# Patient Record
Sex: Female | Born: 1945 | Race: Asian | Hispanic: No | Marital: Married | Smoking: Never smoker
Health system: Southern US, Community
[De-identification: ages and names within clinical notes are randomized; demographics above are authoritative.]

## PROBLEM LIST (undated history)

## (undated) DIAGNOSIS — F32A Depression, unspecified: Secondary | ICD-10-CM

## (undated) DIAGNOSIS — M1990 Unspecified osteoarthritis, unspecified site: Secondary | ICD-10-CM

## (undated) DIAGNOSIS — I10 Essential (primary) hypertension: Secondary | ICD-10-CM

## (undated) DIAGNOSIS — I639 Cerebral infarction, unspecified: Secondary | ICD-10-CM

## (undated) DIAGNOSIS — I4891 Unspecified atrial fibrillation: Secondary | ICD-10-CM

## (undated) DIAGNOSIS — E785 Hyperlipidemia, unspecified: Secondary | ICD-10-CM

---

## 2012-10-26 ENCOUNTER — Encounter (HOSPITAL_BASED_OUTPATIENT_CLINIC_OR_DEPARTMENT_OTHER): Payer: Self-pay

## 2012-10-26 ENCOUNTER — Emergency Department (HOSPITAL_BASED_OUTPATIENT_CLINIC_OR_DEPARTMENT_OTHER)
Admission: EM | Admit: 2012-10-26 | Discharge: 2012-10-26 | Disposition: A | Discharge status: Home / Self Care | Payer: Medicare Other | Attending: Emergency Medicine | Admitting: Emergency Medicine

## 2012-10-26 DIAGNOSIS — I10 Essential (primary) hypertension: Secondary | ICD-10-CM | POA: Insufficient documentation

## 2012-10-26 DIAGNOSIS — R040 Epistaxis: Secondary | ICD-10-CM

## 2012-10-26 DIAGNOSIS — E785 Hyperlipidemia, unspecified: Secondary | ICD-10-CM | POA: Insufficient documentation

## 2012-10-26 DIAGNOSIS — Z79899 Other long term (current) drug therapy: Secondary | ICD-10-CM | POA: Insufficient documentation

## 2012-10-26 DIAGNOSIS — Z8739 Personal history of other diseases of the musculoskeletal system and connective tissue: Secondary | ICD-10-CM | POA: Insufficient documentation

## 2012-10-26 HISTORY — DX: Essential (primary) hypertension: I10

## 2012-10-26 HISTORY — DX: Hyperlipidemia, unspecified: E78.5

## 2012-10-26 HISTORY — DX: Unspecified osteoarthritis, unspecified site: M19.90

## 2012-10-26 MED ORDER — COCAINE HCL 4 % EX SOLN: 4.0000 mL | Freq: Once | CUTANEOUS | Status: AC

## 2012-10-26 MED ORDER — COCAINE HCL 4 % EX SOLN
CUTANEOUS | Status: AC
  Administered 2012-10-26: 4 mL via NASAL
  Filled 2012-10-26: qty 4

## 2012-10-26 MED ORDER — HYDROCODONE-ACETAMINOPHEN 5-325 MG PO TABS: 1.0000 | ORAL_TABLET | ORAL | Status: DC | PRN

## 2012-10-26 MED ORDER — HYDROCODONE-ACETAMINOPHEN 5-325 MG PO TABS
1.0000 | ORAL_TABLET | Freq: Once | ORAL | Status: AC
  Administered 2012-10-26: 1 via ORAL
  Filled 2012-10-26: qty 1

## 2012-10-26 NOTE — ED Notes (Signed)
MD at bedside. 

## 2012-10-26 NOTE — ED Notes (Signed)
MD at bedside using fiberoptic scope. Pts left hare has started bleeding again.

## 2012-10-26 NOTE — ED Notes (Signed)
Rapid Rhino placed in left nare by Dr. Ignacia Palma.  Pt tolerated procedure well.

## 2012-10-26 NOTE — ED Notes (Signed)
MD at bedside giving DC plan of care.

## 2012-10-26 NOTE — ED Provider Notes (Signed)
History     CSN: [id number]  Arrival date & time 10/26/12  1702   First MD Initiated Contact with Patient 10/26/12 1717      Chief Complaint  Patient presents with  . Epistaxis    (Consider location/radiation/quality/duration/timing/severity/associated sxs/prior treatment) Patient is a 67 y.o. female presenting with nosebleeds. The history is provided by the patient and a relative. A language interpreter was used (Pt's family member interprets for her.).  Epistaxis  This is a new problem. The current episode started yesterday. Episode frequency: Pt had nosebleed yesterday, which seemed to come from both nostrils, and resolved with direct pressure.  It recurred today, and did not resolve with pressure for one hour. The problem has not changed since onset.Associated with: She has a history of hypertension.  She does not take anticoagulant medicine. The bleeding has been from both nares. She has tried applying pressure for the symptoms. The treatment provided no relief. Her past medical history does not include bleeding disorder or frequent nosebleeds.    Past Medical History  Diagnosis Date  . Hypertension   . Hyperlipidemia   . Arthritis     History reviewed. No pertinent past surgical history.  History reviewed. No pertinent family history.  History  Substance Use Topics  . Smoking status: Never Smoker   . Smokeless tobacco: Never Used  . Alcohol Use: No    OB History   Grav Para Term Preterm Abortions TAB SAB Ect Mult Living                  Review of Systems  Constitutional: Negative for fever and chills.  HENT: Positive for nosebleeds.   Eyes: Negative.   Respiratory: Negative.   Cardiovascular:       Hypertension.  Gastrointestinal: Negative.   Genitourinary: Negative.   Musculoskeletal: Negative.   Skin: Negative.   Neurological: Negative.   Psychiatric/Behavioral: Negative.     Allergies  Review of patient's allergies indicates no known  allergies.  Home Medications   Current Outpatient Rx  Name  Route  Sig  Dispense  Refill  . HYDROcodone-acetaminophen (NORCO/VICODIN) 5-325 MG per tablet   Oral   Take 1 tablet by mouth every 6 (six) hours as needed for pain.         Marland Kitchen METOPROLOL SUCCINATE ER PO   Oral   Take by mouth daily.         Marland Kitchen PRAVASTATIN SODIUM PO   Oral   Take by mouth daily.         . Valsartan (DIOVAN PO)   Oral   Take by mouth daily.           BP 229/112  Pulse 70  Resp 20  SpO2 98%  Physical Exam  Nursing note and vitals reviewed. Constitutional:  BP 229/112.  Bleeding profusely from both nostrils.  HENT:  Head: Normocephalic and atraumatic.  Right Ear: External ear normal.  Left Ear: External ear normal.  Mouth/Throat: Oropharynx is clear and moist.  Profuse bleeding from both nostrils, precluding visualization of a point of bleeding.  Eyes: Conjunctivae and EOM are normal. Pupils are equal, round, and reactive to light.  Neck: Normal range of motion. Neck supple.  Cardiovascular: Normal rate, regular rhythm and normal heart sounds.   Pulmonary/Chest: Effort normal and breath sounds normal.  Abdominal: Soft. Bowel sounds are normal.  Musculoskeletal: Normal range of motion. She exhibits no edema and no tenderness.  Neurological: She is alert.  No sensory or motor deficit.  Skin: Skin is warm.  Psychiatric: She has a normal mood and affect. Her behavior is normal.    ED Course  Procedures (including critical care time)  5:38 PM Pt seen --> physical exam performed.   Pt had pledgettes of cotton with 4% cocaine solution placed in both nostrils to decongest her nose and to try to control bleeding to facilitate examination.  6:13 PM Bleeding is coming only from the left nostril.  Nasal speculum exam shows no point of bleeding.   Packed with 7.5 cm Rapid Rhino packing, with control of bleeding.  7:13 PM No further bleeding.  Advised she would need to leave the packing in  for three days.  Rx hydrocodone-acetaminophen q4h prn pain.  Advised that if she had recurrent bleeding she would need evaluation by an ENT specialist.  Referred provisionally for this to Dr. Serena Colonel.   1. Acute posterior epistaxis            Carleene Cooper III, MD 10/26/12 [phone]

## 2012-10-26 NOTE — ED Notes (Signed)
Family member states that this is the 3rd nosebleed the patient has had in two days.  Pt presents with rag to face, holding pressure.  Language barrier noted.

## 2012-10-29 ENCOUNTER — Emergency Department (HOSPITAL_BASED_OUTPATIENT_CLINIC_OR_DEPARTMENT_OTHER)
Admission: EM | Admit: 2012-10-29 | Discharge: 2012-10-29 | Disposition: A | Discharge status: Home / Self Care | Payer: Medicare Other | Attending: Emergency Medicine | Admitting: Emergency Medicine

## 2012-10-29 ENCOUNTER — Encounter (HOSPITAL_BASED_OUTPATIENT_CLINIC_OR_DEPARTMENT_OTHER): Payer: Self-pay | Admitting: Family Medicine

## 2012-10-29 DIAGNOSIS — Z8739 Personal history of other diseases of the musculoskeletal system and connective tissue: Secondary | ICD-10-CM | POA: Insufficient documentation

## 2012-10-29 DIAGNOSIS — E785 Hyperlipidemia, unspecified: Secondary | ICD-10-CM | POA: Insufficient documentation

## 2012-10-29 DIAGNOSIS — Z79899 Other long term (current) drug therapy: Secondary | ICD-10-CM | POA: Insufficient documentation

## 2012-10-29 DIAGNOSIS — R040 Epistaxis: Secondary | ICD-10-CM | POA: Insufficient documentation

## 2012-10-29 DIAGNOSIS — I10 Essential (primary) hypertension: Secondary | ICD-10-CM | POA: Insufficient documentation

## 2012-10-29 NOTE — ED Provider Notes (Signed)
Medical screening examination/treatment/procedure(s) were performed by non-physician practitioner and as supervising physician I was immediately available for consultation/collaboration.  Lotoya Casella, MD 10/29/12 1902 

## 2012-10-29 NOTE — ED Notes (Signed)
Pt family member sts pt is here for nasal packing removal. Family member also sts pt is out of BP meds and does not have appt to see family dr for 2 wks.

## 2012-10-29 NOTE — ED Provider Notes (Signed)
History     CSN: [id number]  Arrival date & time 10/29/12  0809   First MD Initiated Contact with Patient 10/29/12 [phone]      Chief Complaint  Patient presents with  . Follow-up    (Consider location/radiation/quality/duration/timing/severity/associated sxs/prior treatment) Patient is a 67 y.o. female presenting with nosebleeds. The history is provided by the patient. No language interpreter was used.  Epistaxis  This is a new problem. The problem occurs constantly. The problem has been gradually worsening. The bleeding has been from the left nare.  Pt here for packing removal.    Past Medical History  Diagnosis Date  . Hypertension   . Hyperlipidemia   . Arthritis     History reviewed. No pertinent past surgical history.  No family history on file.  History  Substance Use Topics  . Smoking status: Never Smoker   . Smokeless tobacco: Never Used  . Alcohol Use: No    OB History   Grav Para Term Preterm Abortions TAB SAB Ect Mult Living                  Review of Systems  HENT: Positive for nosebleeds.   All other systems reviewed and are negative.    Allergies  Review of patient's allergies indicates no known allergies.  Home Medications   Current Outpatient Rx  Name  Route  Sig  Dispense  Refill  . HYDROcodone-acetaminophen (NORCO/VICODIN) 5-325 MG per tablet   Oral   Take 1 tablet by mouth every 6 (six) hours as needed for pain.         Marland Kitchen HYDROcodone-acetaminophen (NORCO/VICODIN) 5-325 MG per tablet   Oral   Take 1 tablet by mouth every 4 (four) hours as needed for pain.   20 tablet   0   . METOPROLOL SUCCINATE ER PO   Oral   Take by mouth daily.         Marland Kitchen PRAVASTATIN SODIUM PO   Oral   Take by mouth daily.         . Valsartan (DIOVAN PO)   Oral   Take by mouth daily.           BP 170/82  Pulse 64  Temp(Src) 97.7 F (36.5 C) (Oral)  Resp 18  SpO2 97%  Physical Exam  Nursing note and vitals reviewed. Constitutional: She  appears well-developed and well-nourished.  HENT:  Head: Normocephalic and atraumatic.  Packing left nostril,  Removed,  Eyes: Pupils are equal, round, and reactive to light.  Cardiovascular: Normal rate.   Pulmonary/Chest: Effort normal.  Musculoskeletal: Normal range of motion.  Neurological: She is alert.  Skin: Skin is warm.    ED Course  Procedures (including critical care time)  Labs Reviewed - No data to display No results found.   1. Nosebleed       MDM  No further bleeding.  Pt given rx for medications       Elson Areas, PA-C 10/29/12 0925  Elson Areas, PA-C 10/29/12 [phone]

## 2012-10-29 NOTE — ED Notes (Signed)
MD at bedside. 

## 2018-01-01 ENCOUNTER — Emergency Department (HOSPITAL_BASED_OUTPATIENT_CLINIC_OR_DEPARTMENT_OTHER): Payer: Medicare Other

## 2018-01-01 ENCOUNTER — Encounter (HOSPITAL_BASED_OUTPATIENT_CLINIC_OR_DEPARTMENT_OTHER): Payer: Self-pay | Admitting: *Deleted

## 2018-01-01 ENCOUNTER — Emergency Department (HOSPITAL_BASED_OUTPATIENT_CLINIC_OR_DEPARTMENT_OTHER)
Admission: EM | Admit: 2018-01-01 | Discharge: 2018-01-01 | Disposition: A | Discharge status: Home / Self Care | Payer: Medicare Other | Attending: Emergency Medicine | Admitting: Emergency Medicine

## 2018-01-01 ENCOUNTER — Other Ambulatory Visit: Payer: Self-pay

## 2018-01-01 DIAGNOSIS — Y9301 Activity, walking, marching and hiking: Secondary | ICD-10-CM | POA: Diagnosis not present

## 2018-01-01 DIAGNOSIS — S00531A Contusion of lip, initial encounter: Secondary | ICD-10-CM | POA: Insufficient documentation

## 2018-01-01 DIAGNOSIS — Y929 Unspecified place or not applicable: Secondary | ICD-10-CM | POA: Diagnosis not present

## 2018-01-01 DIAGNOSIS — S02401A Maxillary fracture, unspecified, initial encounter for closed fracture: Secondary | ICD-10-CM

## 2018-01-01 DIAGNOSIS — Z79899 Other long term (current) drug therapy: Secondary | ICD-10-CM | POA: Diagnosis not present

## 2018-01-01 DIAGNOSIS — S40011A Contusion of right shoulder, initial encounter: Secondary | ICD-10-CM | POA: Diagnosis not present

## 2018-01-01 DIAGNOSIS — S0083XA Contusion of other part of head, initial encounter: Secondary | ICD-10-CM

## 2018-01-01 DIAGNOSIS — Y999 Unspecified external cause status: Secondary | ICD-10-CM | POA: Diagnosis not present

## 2018-01-01 DIAGNOSIS — S0990XA Unspecified injury of head, initial encounter: Secondary | ICD-10-CM | POA: Diagnosis present

## 2018-01-01 DIAGNOSIS — W01119A Fall on same level from slipping, tripping and stumbling with subsequent striking against unspecified sharp object, initial encounter: Secondary | ICD-10-CM | POA: Insufficient documentation

## 2018-01-01 DIAGNOSIS — S0240CA Maxillary fracture, right side, initial encounter for closed fracture: Secondary | ICD-10-CM | POA: Diagnosis not present

## 2018-01-01 DIAGNOSIS — W19XXXA Unspecified fall, initial encounter: Secondary | ICD-10-CM

## 2018-01-01 IMAGING — DX DG CHEST 2V
2 series · 2 of 2 positions shown · non-contrast
Comparison: Chest x-ray dated [DATE].

CLINICAL DATA: Fall today, RIGHT-sided pain, RIGHT shoulder pain.
History of hypertension.

EXAM:
CHEST - 2 VIEW

[chest pa]
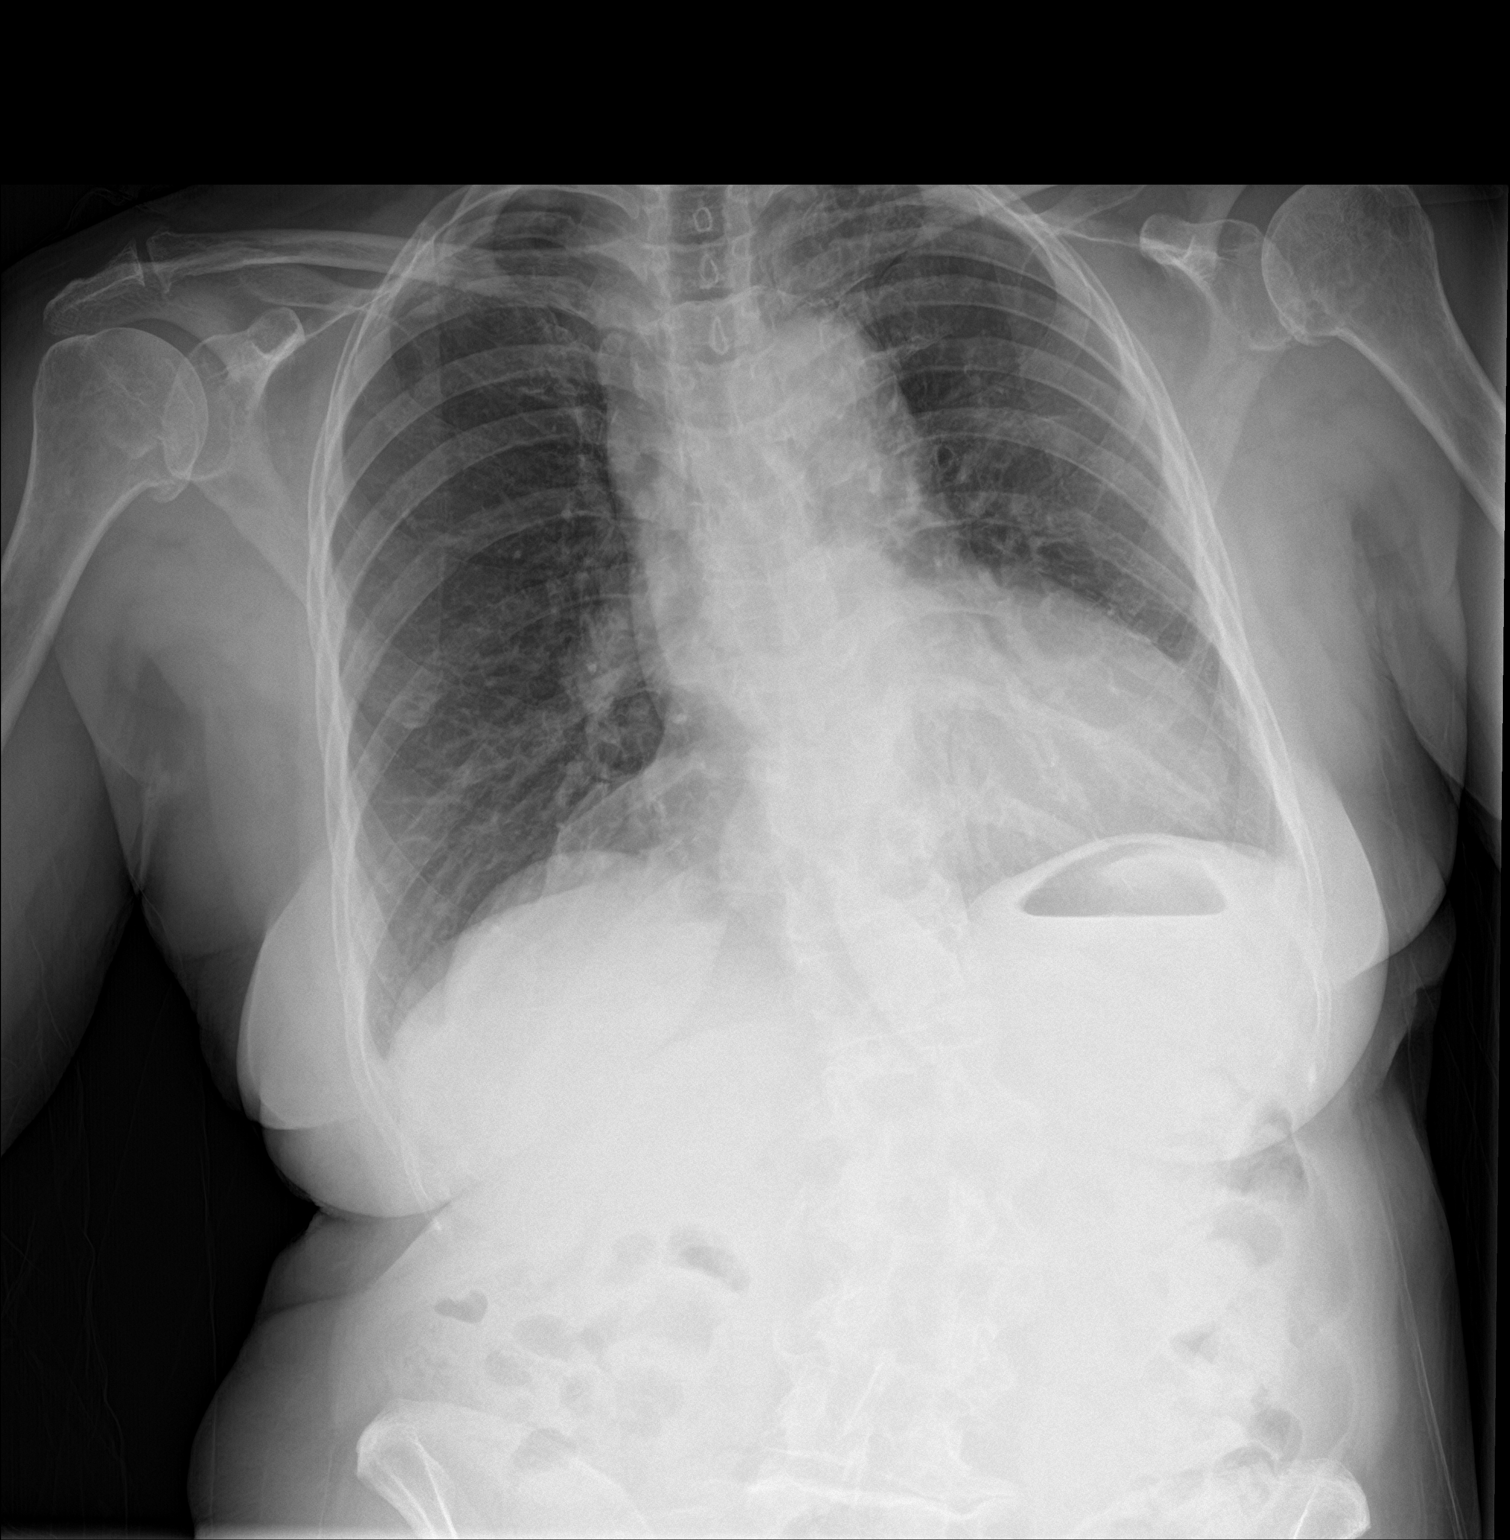

[chest lat]
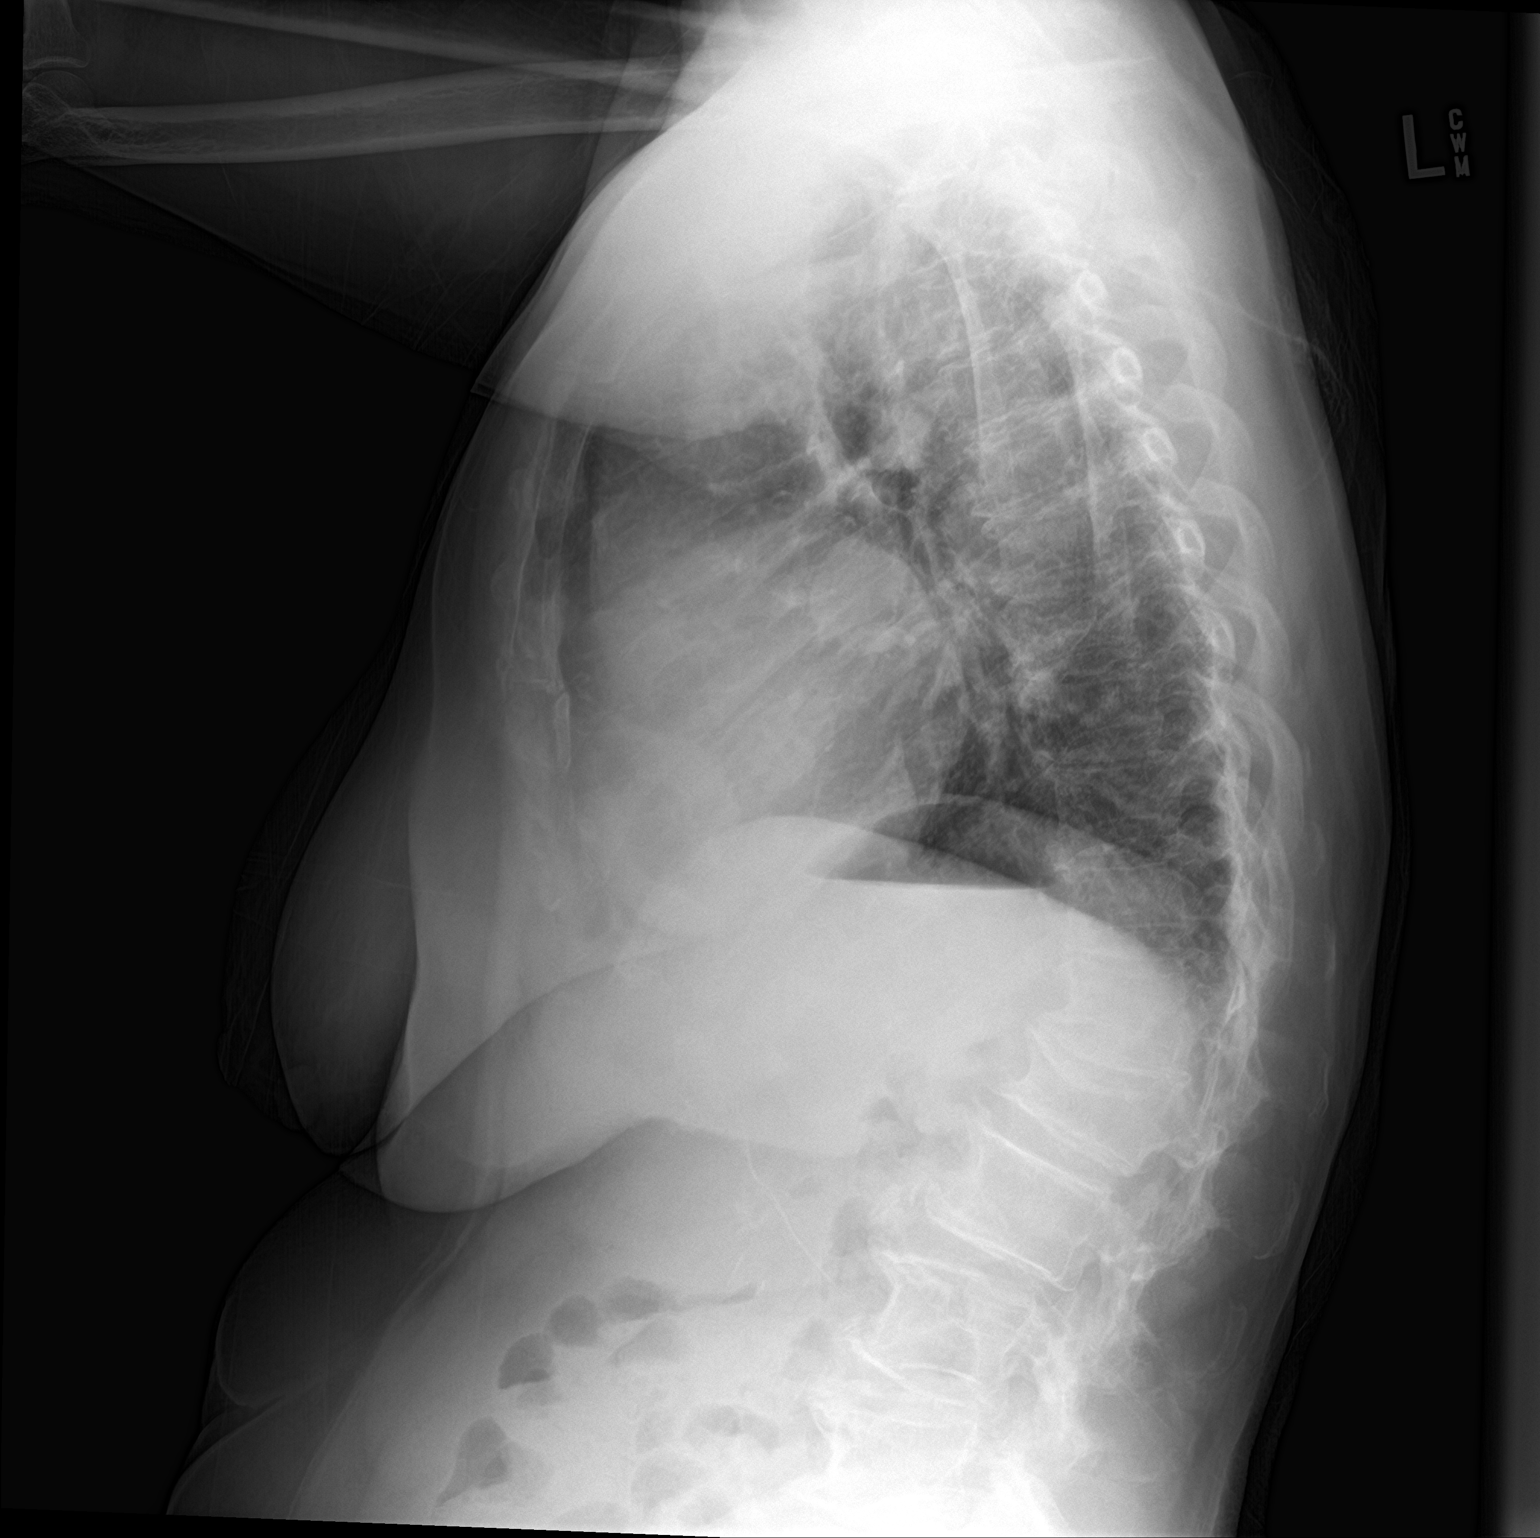

[2 of 2 positions shown; findings below may reference images not displayed]

FINDINGS: Cardiomediastinal silhouette is stable in size and configuration.
Again noted is biapical pleuroparenchymal scarring/thickening. Lungs
are otherwise clear. No pleural effusion or pneumothorax seen.

No acute osseous fracture or dislocation identified. Stable
compression deformities at the thoracolumbar junction and stable
scoliosis of the thoracolumbar spine.
IMPRESSION: No acute findings.

## 2018-01-01 IMAGING — CT CT HEAD W/O CM
3 series · 14 of 47 positions shown, 16 images · non-contrast
Comparison: [DATE]

CLINICAL DATA: Patient got up to go to the restroom and fell
against MASH[place]I. Bruising of the right-sided face and
shoulder.

EXAM:
CT HEAD WITHOUT CONTRAST
TECHNIQUE: Contiguous axial images were obtained from the base of the skull
through the vertex without intravenous contrast.

[Series 2: head wo · axial · 0.41mm/px · z∈[-64,+62]mm · 8 of 30 slices shown, 10 images]
[im 3/30  brain]
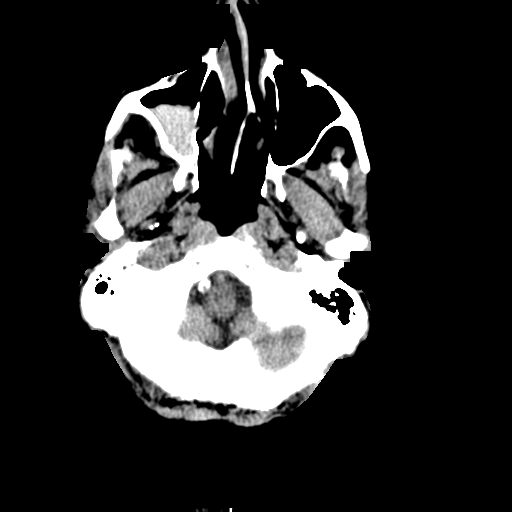
[im 3/30  bone]
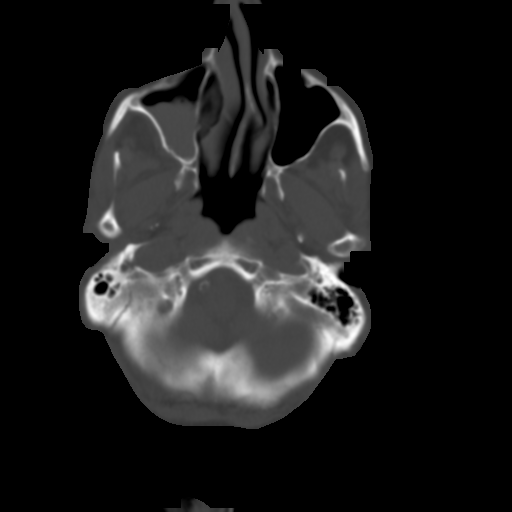
[im 7/30  brain]
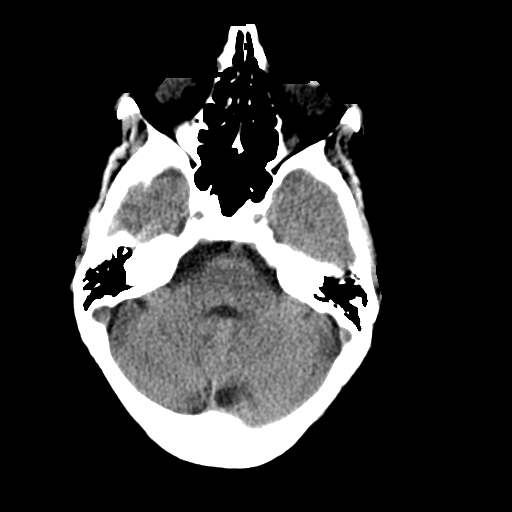
[im 10/30  brain]
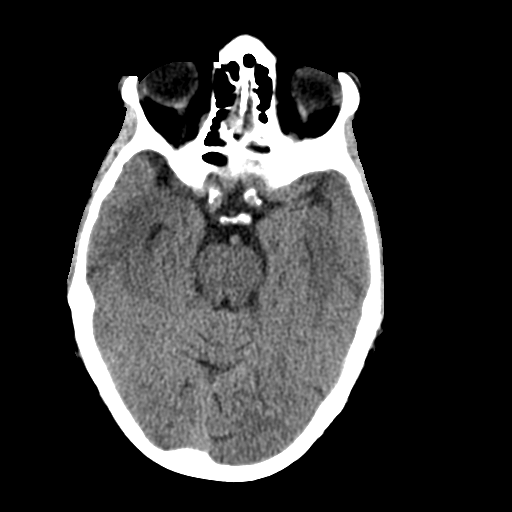
[im 14/30  brain]
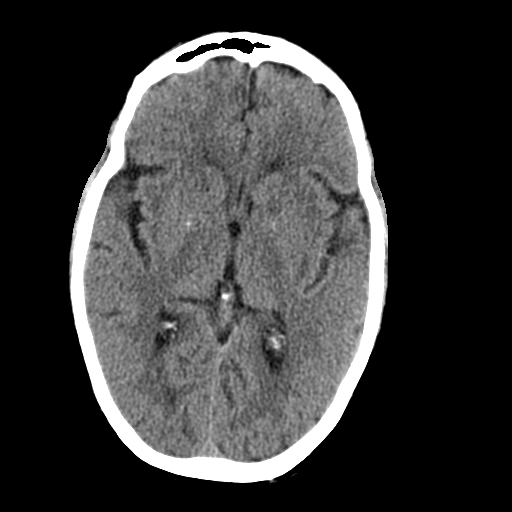
[im 17/30  brain]
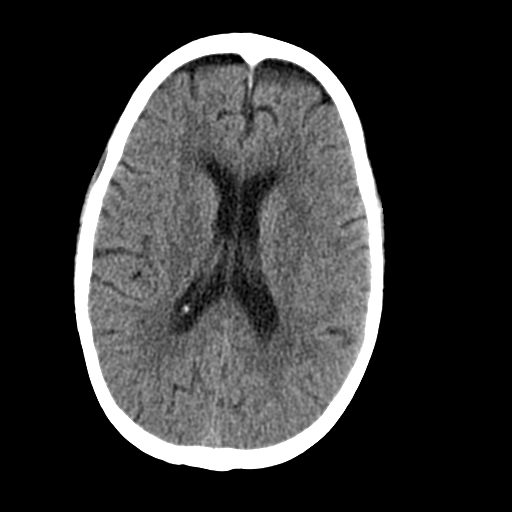
[im 17/30  bone]
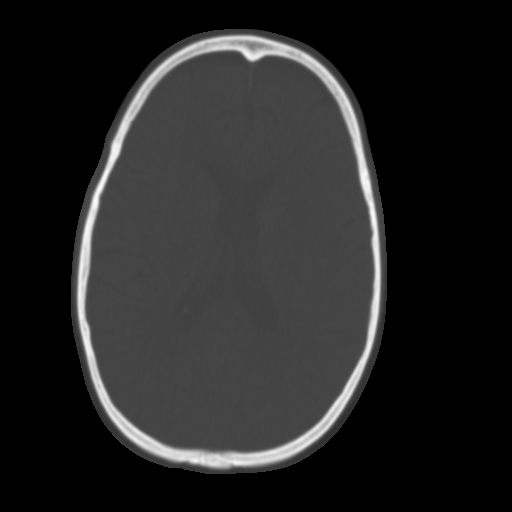
[im 21/30  brain]
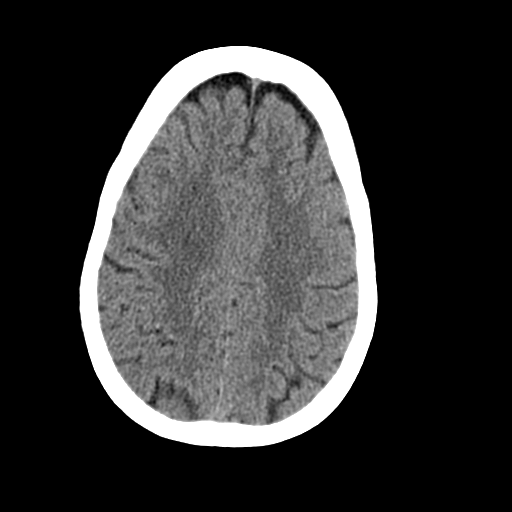
[im 24/30  brain]
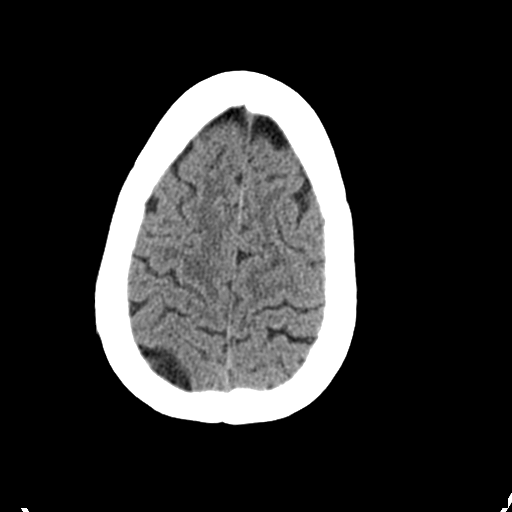
[im 28/30  brain]
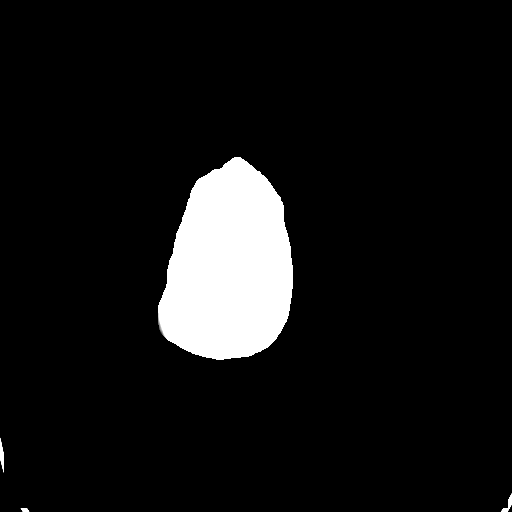

[Series 4: cor soft · coronal · 0.29mm/px · 3 of 77 slices shown]
[im 26/77  brain]
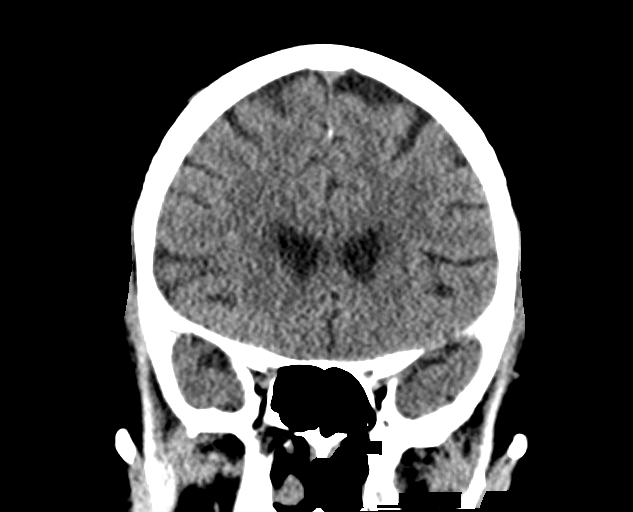
[im 34/77  brain]
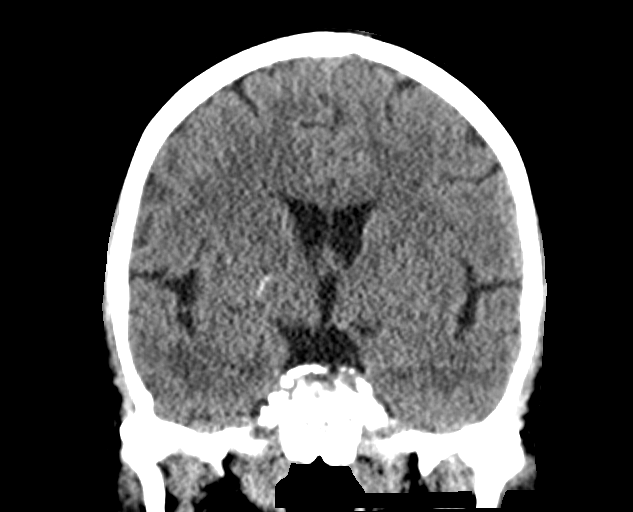
[im 43/77  brain]
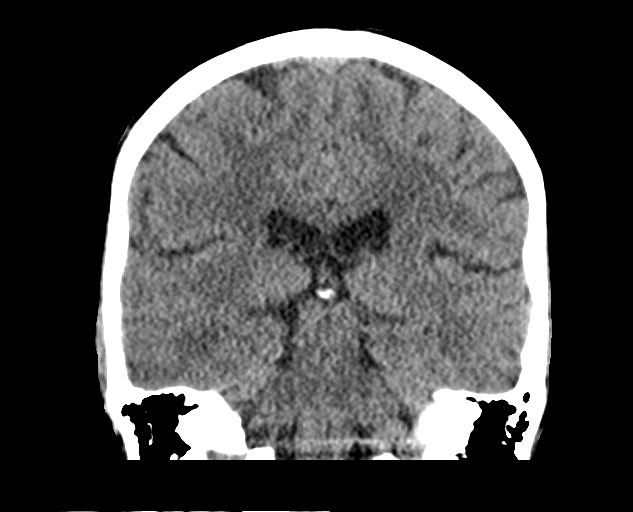

[Series 5: sag soft · sagittal · 0.29mm/px · 3 of 62 slices shown]
[im 21/62  brain]
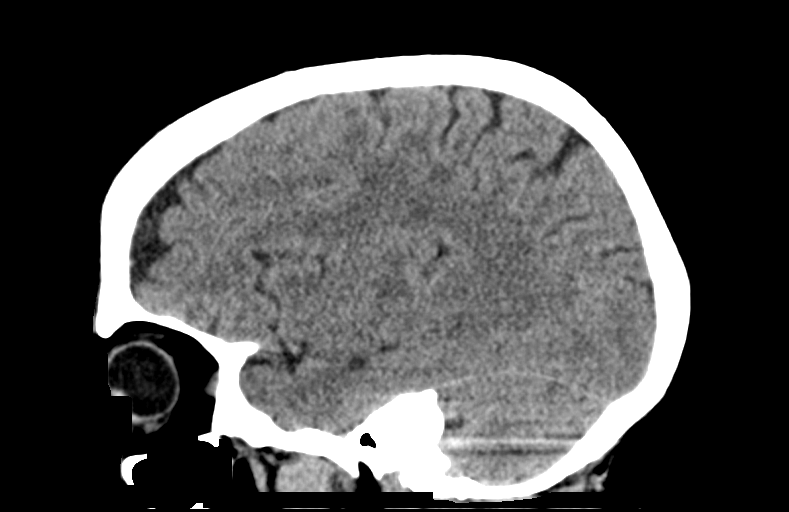
[im 31/62  brain]
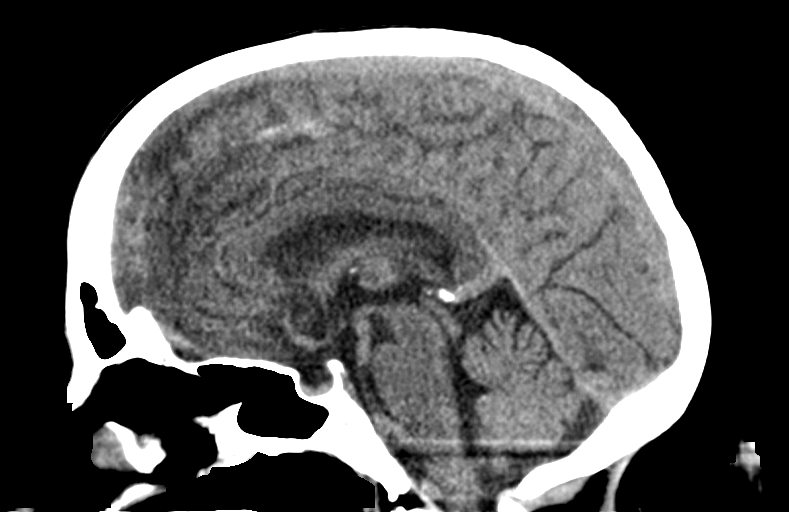
[im 41/62  brain]
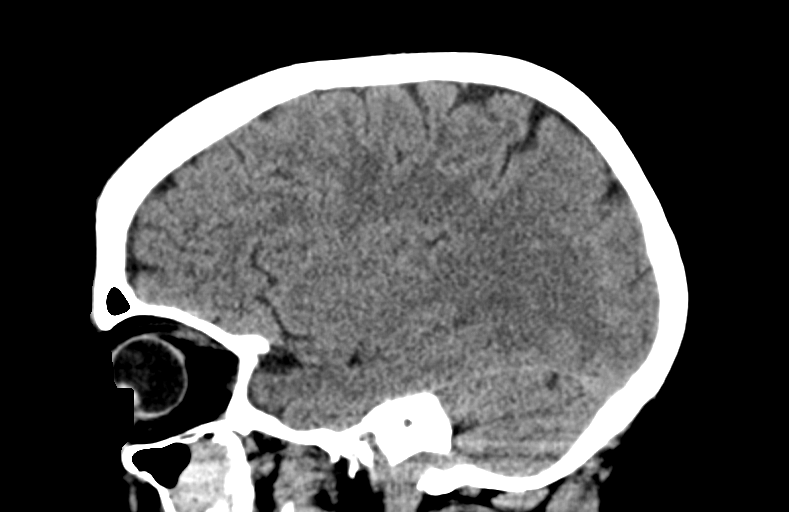

[14 of 47 positions shown; findings below may reference images not displayed]

FINDINGS: Brain: Chronic mild bifrontal atrophy with minimal small vessel
ischemic disease of periventricular white matter. No acute
intracranial hemorrhage, midline shift or edema. No hydrocephalus.
It pathic via the calcifications are noted as before. Midline fourth
ventricle and basal cisterns without effacement.

Vascular: Moderate atherosclerosis of the carotid siphons. No
hyperdense vessel sign.

Skull: No acute calvarial fracture or significant soft tissue
swelling of the scalp.

Sinuses/Orbits: Anterior, medial and lateral right maxillary sinus
wall fractures with hyperdense blood products in the right maxillary
sinus. Intact zygomatic arches and temporomandibular joints. No
definite orbital floor fracture is identified. Retrobulbar emphysema
nor hemorrhage is noted. Intact globes bilaterally.

Other: Mild right malar soft tissue swelling.
IMPRESSION: 1. Acute minimally displaced right maxillary sinus wall fractures
with hyperdense fluid in the right maxillary sinus compatible blood
products.
2. No acute intracranial abnormality. Chronic minimal small vessel
ischemia and atrophy.
3. Mild soft tissue swelling of the right cheek.

## 2018-01-01 IMAGING — DX DG SHOULDER 2+V*R*
3 series · 3 of 3 positions shown · non-contrast
Comparison: None.

CLINICAL DATA: Fall today, RIGHT side and RIGHT shoulder pain.

EXAM:
RIGHT SHOULDER - 2+ VIEW

[shoulder grashey]
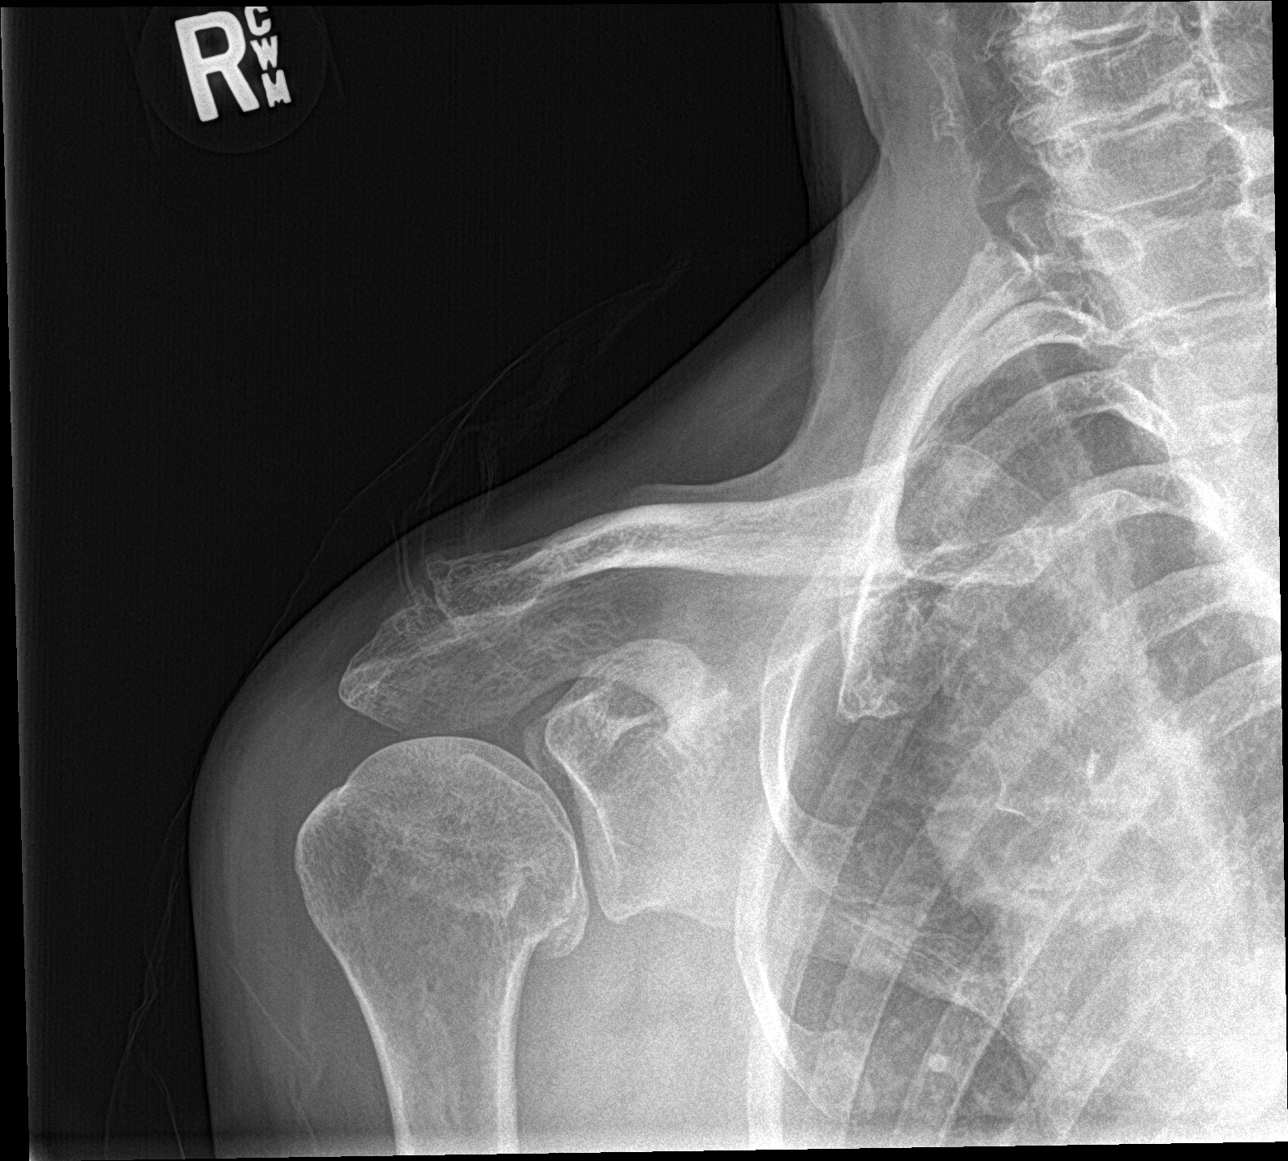

[shoulder y view]
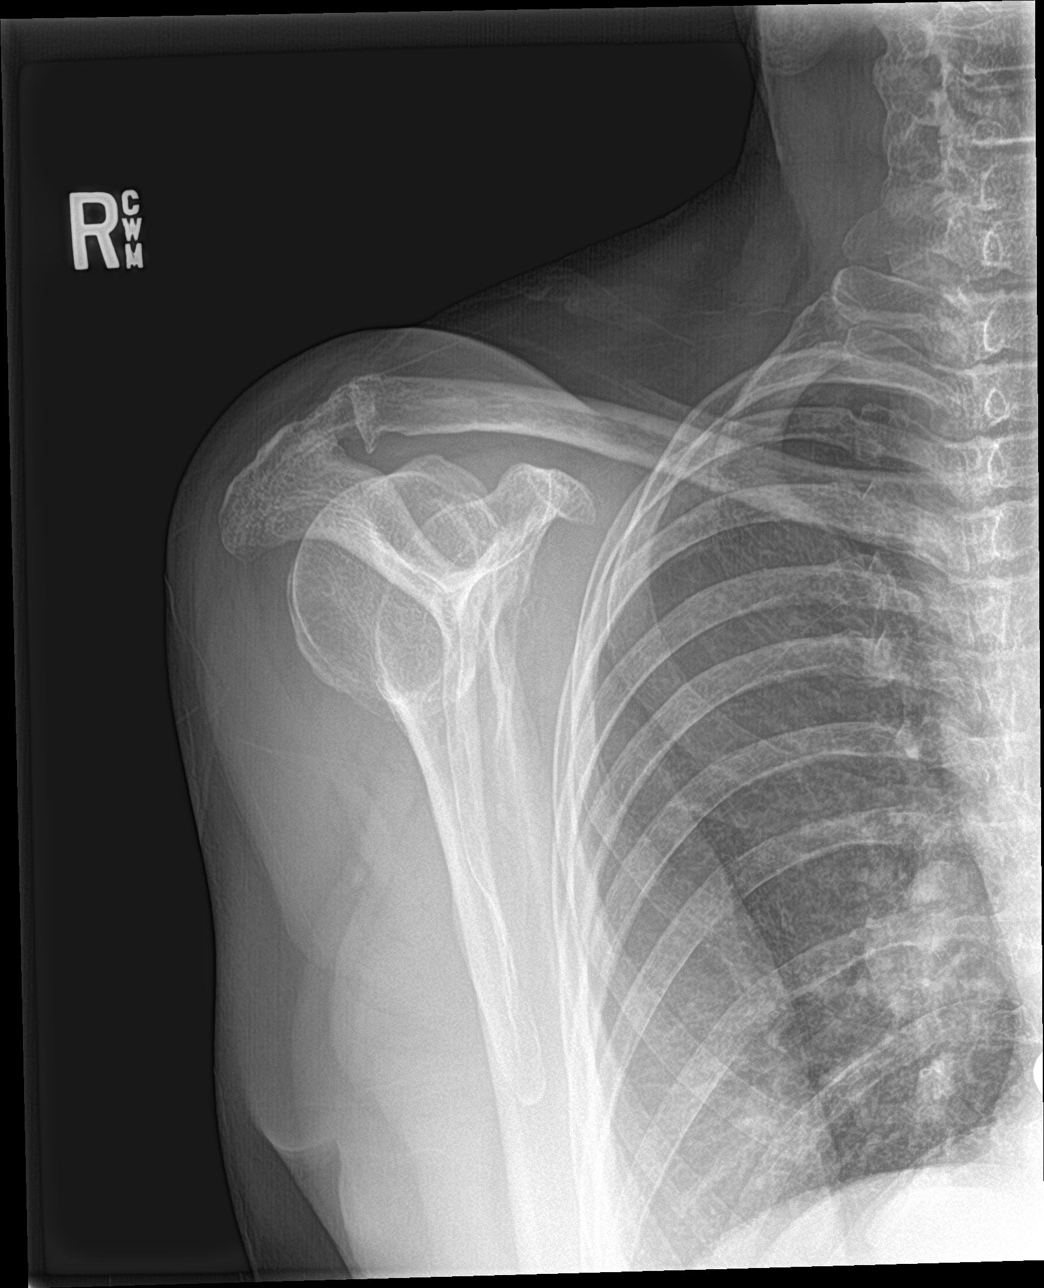

[shoulder axillary]
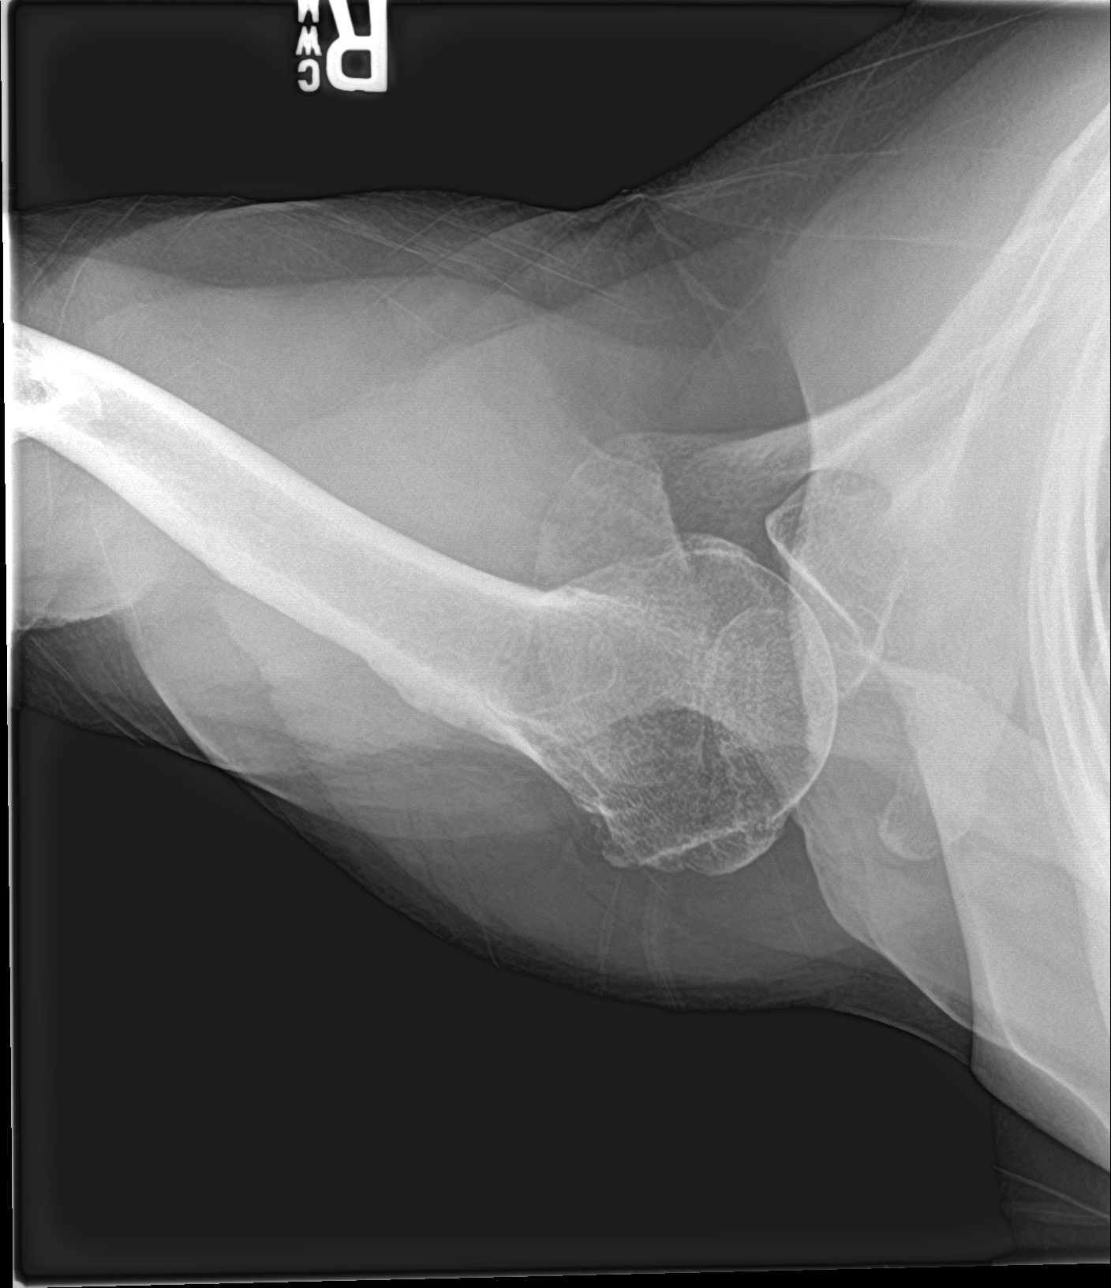

[3 of 3 positions shown; findings below may reference images not displayed]

FINDINGS: Osseous alignment is normal. No fracture line or displaced fracture
fragment identified.
IMPRESSION: Negative.

## 2018-01-01 MED ORDER — ACETAMINOPHEN 500 MG PO TABS: 1000.0000 mg | ORAL_TABLET | Freq: Four times a day (QID) | ORAL | 0 refills | Status: DC | PRN

## 2018-01-01 MED ORDER — AMOXICILLIN-POT CLAVULANATE 875-125 MG PO TABS: 1.0000 | ORAL_TABLET | Freq: Two times a day (BID) | ORAL | 0 refills | Status: DC

## 2018-01-01 MED ORDER — ACETAMINOPHEN 500 MG PO TABS
1000.0000 mg | ORAL_TABLET | Freq: Once | ORAL | Status: AC
  Administered 2018-01-01: 1000 mg via ORAL
  Filled 2018-01-01: qty 2

## 2018-01-01 MED ORDER — AMOXICILLIN-POT CLAVULANATE 875-125 MG PO TABS
1.0000 | ORAL_TABLET | Freq: Once | ORAL | Status: AC
  Administered 2018-01-01: 1 via ORAL
  Filled 2018-01-01: qty 1

## 2018-01-01 NOTE — ED Provider Notes (Signed)
[facility] EMERGENCY DEPARTMENT Provider Note   CSN: [id number] Arrival date & time: 01/01/18  1756     History   Chief Complaint Chief Complaint  Patient presents with  . Fall    HPI Kim [name] is a 72 y.o. female.  HPI Dizzy yesterday evening.  She lost her balance and fell striking the side of her head on the right against a door.  Also struck her shoulder.  She has had a headache today.  Her shoulder remains sore and bruised as well.  She did not have any loss of consciousness.  No confusion or mental status change.  She presents with her daughter this evening.  They are concerned for ongoing headache with facial bruising.  She typically takes tramadol for pain control.  She has rheumatoid arthritis.  She is not had any neurologic deficits.  She has been ambulatory at baseline.  No confusion or change in cognitive function. Past Medical History:  Diagnosis Date  . Arthritis   . Hyperlipidemia   . Hypertension     There are no active problems to display for this patient.   History reviewed. No pertinent surgical history.   OB History   None      Home Medications    Prior to Admission medications   Medication Sig Start Date End Date Taking? Authorizing Provider  acetaminophen (TYLENOL) 500 MG tablet Take 2 tablets (1,000 mg total) by mouth every 6 (six) hours as needed. 01/01/18   Arby Barrette, MD  amoxicillin-clavulanate (AUGMENTIN) 875-125 MG tablet Take 1 tablet by mouth 2 (two) times daily. One po bid x 7 days 01/01/18   Arby Barrette, MD  HYDROcodone-acetaminophen (NORCO/VICODIN) 5-325 MG per tablet Take 1 tablet by mouth every 6 (six) hours as needed for pain.    [provider]  HYDROcodone-acetaminophen (NORCO/VICODIN) 5-325 MG per tablet Take 1 tablet by mouth every 4 (four) hours as needed for pain. 10/26/12   Carleene Cooper, MD  PRAVASTATIN SODIUM PO Take by mouth daily.    [provider]  Valsartan (DIOVAN PO) Take by  mouth daily.    [provider]    Family History History reviewed. No pertinent family history.  Social History Social History   Tobacco Use  . Smoking status: Never Smoker  . Smokeless tobacco: Never Used  Substance Use Topics  . Alcohol use: No  . Drug use: No     Allergies   Patient has no known allergies.   Review of Systems Review of Systems 10 Systems reviewed and are negative for acute change except as noted in the HPI.   Physical Exam Updated Vital Signs BP (!) 143/82 (BP Location: Left Arm)   Pulse 81   Temp 98.1 F (36.7 C) (Oral)   Resp 20   Ht 5' (1.524 m)   Wt 56 kg (123 lb 8 oz)   SpO2 97%   BMI 24.12 kg/m   Physical Exam  Constitutional: She is oriented to person, place, and time. She appears well-developed and well-nourished.  HENT:  Minor bruising at right zygoma without swelling.  Moderate bruising of left upper lip but no laceration or large swelling.  Normal range of motion of the jaw.  Eyes: Pupils are equal, round, and reactive to light. EOM are normal.  Neck: Neck supple.  Cardiovascular: Normal rate, regular rhythm, normal heart sounds and intact distal pulses.  Pulmonary/Chest: Effort normal and breath sounds normal. She exhibits no tenderness.  Abdominal: Soft. Bowel sounds are  normal. She exhibits no distension. There is no tenderness.  Musculoskeletal: Normal range of motion. She exhibits tenderness. She exhibits no edema.  Ecchymotic bruising over right shoulder.  Pain with range of motion of right shoulder.  Neuro vascularly intact.  Bilateral lower extremities normal.  No contusions or abrasions.  Normal range of motion.  Neurological: She is alert and oriented to person, place, and time. She has normal strength. Coordination normal. GCS eye subscore is 4. GCS verbal subscore is 5. GCS motor subscore is 6.  Skin: Skin is warm, dry and intact.  Psychiatric: She has a normal mood and affect.     ED Treatments / Results    Labs (all labs ordered are listed, but only abnormal results are displayed) Labs Reviewed - No data to display  EKG None  Radiology Dg Chest 2 View  Result Date: 01/01/2018 CLINICAL DATA:  Fall today, RIGHT-sided pain, RIGHT shoulder pain. History of hypertension. EXAM: CHEST - 2 VIEW COMPARISON:  Chest x-ray dated 07/18/2017. FINDINGS: Cardiomediastinal silhouette is stable in size and configuration. Again noted is biapical pleuroparenchymal scarring/thickening. Lungs are otherwise clear. No pleural effusion or pneumothorax seen. No acute osseous fracture or dislocation identified. Stable compression deformities at the thoracolumbar junction and stable scoliosis of the thoracolumbar spine. IMPRESSION: No acute findings. Electronically Signed   By: Bary Richard M.D.   On: 01/01/2018 19:16   Dg Shoulder Right  Result Date: 01/01/2018 CLINICAL DATA:  Fall today, RIGHT side and RIGHT shoulder pain. EXAM: RIGHT SHOULDER - 2+ VIEW COMPARISON:  None. FINDINGS: Osseous alignment is normal. No fracture line or displaced fracture fragment identified. IMPRESSION: Negative. Electronically Signed   By: Bary Richard M.D.   On: 01/01/2018 19:14   Ct Head Wo Contrast  Result Date: 01/01/2018 CLINICAL DATA:  Patient got up to go to the restroom and fell against a closet door. Bruising of the right-sided face and shoulder. EXAM: CT HEAD WITHOUT CONTRAST TECHNIQUE: Contiguous axial images were obtained from the base of the skull through the vertex without intravenous contrast. COMPARISON:  07/18/2017 FINDINGS: Brain: Chronic mild bifrontal atrophy with minimal small vessel ischemic disease of periventricular white matter. No acute intracranial hemorrhage, midline shift or edema. No hydrocephalus. It pathic via the calcifications are noted as before. Midline fourth ventricle and basal cisterns without effacement. Vascular: Moderate atherosclerosis of the carotid siphons. No hyperdense vessel sign. Skull: No acute  calvarial fracture or significant soft tissue swelling of the scalp. Sinuses/Orbits: Anterior, medial and lateral right maxillary sinus wall fractures with hyperdense blood products in the right maxillary sinus. Intact zygomatic arches and temporomandibular joints. No definite orbital floor fracture is identified. Retrobulbar emphysema nor hemorrhage is noted. Intact globes bilaterally. Other: Mild right malar soft tissue swelling. IMPRESSION: 1. Acute minimally displaced right maxillary sinus wall fractures with hyperdense fluid in the right maxillary sinus compatible blood products. 2. No acute intracranial abnormality. Chronic minimal small vessel ischemia and atrophy. 3. Mild soft tissue swelling of the right cheek. Electronically Signed   By: Tollie Eth M.D.   On: 01/01/2018 19:25    Procedures Procedures (including critical care time)  Medications Ordered in ED Medications  amoxicillin-clavulanate (AUGMENTIN) 875-125 MG per tablet 1 tablet (has no administration in time range)  acetaminophen (TYLENOL) tablet 1,000 mg (1,000 mg Oral Given 01/01/18 1852)     Initial Impression / Assessment and Plan / ED Course  I have reviewed the triage vital signs and the nursing notes.  Pertinent labs & imaging results  that were available during my care of the patient were reviewed by me and considered in my medical decision making (see chart for details).      Final Clinical Impressions(s) / ED Diagnoses   Final diagnoses:  Closed fracture of maxillary sinus, initial encounter (HCC)  Contusion of face, initial encounter  Contusion of right shoulder, initial encounter  Fall, initial encounter   Patient is alert and interactive.  No neurologic dysfunction.  She does have facial bruising but not large swelling.  CT confirms a minimally displaced maxillary fracture.  No intracranial injury.  Will be placed on Augmentin empirically.  He takes tramadol for pain at home.  She will continue tramadol and  add acetaminophen.  Patient has contusion to the shoulder but no fracture.  If he does not have shortness of breath or chest wall contusion.  X-ray shows no pneumothorax or rib fracture.  Patient is with her daughter who assists her in her home.  She has competent and attentive caregiver.  Return precautions reviewed.  Follow-up plan reviewed. ED Discharge Orders        Ordered    acetaminophen (TYLENOL) 500 MG tablet  Every 6 hours PRN     01/01/18 2002    amoxicillin-clavulanate (AUGMENTIN) 875-125 MG tablet  2 times daily     01/01/18 2002       Joachim Carton, MD 01/01/18 2009

## 2018-01-01 NOTE — ED Triage Notes (Signed)
Pt amb to triage with slow, steady gait smiling in nad. Daughter reports pt got up to go to the restroom and fell against the closet door and down to the floor. denies loc, bruising noted to right side of face and right shoulder. Pt states she is unsure if she tripped and fell or got dizzy and fell. She is dizzy today, but took a whole tramadol for her pain instead of her usual half tablet.

## 2018-01-01 NOTE — Discharge Instructions (Signed)
1.  You have a nondisplaced broken bone in your maxillary sinus.  Sleep with your head elevated about 30 degrees.  Take Augmentin as prescribed.  Take acetaminophen and tramadol for pain control. 2.  Schedule a recheck with your family doctor at the end of the week. 3.  Return to the emergency department if you are having worsening symptoms

## 2020-04-28 ENCOUNTER — Encounter (HOSPITAL_BASED_OUTPATIENT_CLINIC_OR_DEPARTMENT_OTHER): Payer: Self-pay | Admitting: *Deleted

## 2020-04-28 ENCOUNTER — Emergency Department (HOSPITAL_BASED_OUTPATIENT_CLINIC_OR_DEPARTMENT_OTHER): Payer: Medicare Other

## 2020-04-28 ENCOUNTER — Emergency Department (HOSPITAL_BASED_OUTPATIENT_CLINIC_OR_DEPARTMENT_OTHER)
Admission: EM | Admit: 2020-04-28 | Discharge: 2020-04-29 | Disposition: A | Discharge status: Home / Self Care | Payer: Medicare Other | Attending: Emergency Medicine | Admitting: Emergency Medicine

## 2020-04-28 ENCOUNTER — Other Ambulatory Visit: Payer: Self-pay

## 2020-04-28 DIAGNOSIS — Z79899 Other long term (current) drug therapy: Secondary | ICD-10-CM | POA: Insufficient documentation

## 2020-04-28 DIAGNOSIS — R627 Adult failure to thrive: Secondary | ICD-10-CM | POA: Insufficient documentation

## 2020-04-28 DIAGNOSIS — E871 Hypo-osmolality and hyponatremia: Secondary | ICD-10-CM | POA: Diagnosis not present

## 2020-04-28 DIAGNOSIS — I10 Essential (primary) hypertension: Secondary | ICD-10-CM | POA: Diagnosis not present

## 2020-04-28 DIAGNOSIS — R4182 Altered mental status, unspecified: Secondary | ICD-10-CM | POA: Diagnosis present

## 2020-04-28 DIAGNOSIS — R451 Restlessness and agitation: Secondary | ICD-10-CM | POA: Diagnosis not present

## 2020-04-28 HISTORY — DX: Depression, unspecified: F32.A

## 2020-04-28 LAB — COMPREHENSIVE METABOLIC PANEL
ALT: 19 U/L (ref 0–44)
AST: 28 U/L (ref 15–41)
Albumin: 4.2 g/dL (ref 3.5–5.0)
Alkaline Phosphatase: 71 U/L (ref 38–126)
Anion gap: 11 (ref 5–15)
BUN: 15 mg/dL (ref 8–23)
CO2: 24 mmol/L (ref 22–32)
Calcium: 9.3 mg/dL (ref 8.9–10.3)
Chloride: 90 mmol/L — ABNORMAL LOW (ref 98–111)
Creatinine, Ser: 0.76 mg/dL (ref 0.44–1.00)
GFR, Estimated: >60 mL/min (ref >60)
Glucose, Bld: 98 mg/dL (ref 70–99)
Potassium: 3.3 mmol/L — ABNORMAL LOW (ref 3.5–5.1)
Sodium: 125 mmol/L — ABNORMAL LOW (ref 135–145)
Total Bilirubin: 0.4 mg/dL (ref 0.3–1.2)
Total Protein: 8.1 g/dL (ref 6.5–8.1)

## 2020-04-28 LAB — CBC WITH DIFFERENTIAL/PLATELET
Abs Immature Granulocytes: 0.02 10*3/uL (ref 0.00–0.07)
Basophils Absolute: 0.1 10*3/uL (ref 0.0–0.1)
Basophils Relative: 1 %
Eosinophils Absolute: 0.2 10*3/uL (ref 0.0–0.5)
Eosinophils Relative: 4 %
HCT: 37.2 % (ref 36.0–46.0)
Hemoglobin: 12.9 g/dL (ref 12.0–15.0)
Immature Granulocytes: 0 %
Lymphocytes Relative: 20 %
Lymphs Abs: 1 10*3/uL (ref 0.7–4.0)
MCH: 31.5 pg (ref 26.0–34.0)
MCHC: 34.7 g/dL (ref 30.0–36.0)
MCV: 90.7 fL (ref 80.0–100.0)
Monocytes Absolute: 0.8 10*3/uL (ref 0.1–1.0)
Monocytes Relative: 15 %
Neutro Abs: 3.1 10*3/uL (ref 1.7–7.7)
Neutrophils Relative %: 60 %
Platelets: 392 10*3/uL (ref 150–400)
RBC: 4.1 MIL/uL (ref 3.87–5.11)
RDW: 12.6 % (ref 11.5–15.5)
WBC: 5.1 10*3/uL (ref 4.0–10.5)
nRBC: 0 % (ref 0.0–0.2)

## 2020-04-28 LAB — URINALYSIS, MICROSCOPIC (REFLEX)

## 2020-04-28 LAB — URINALYSIS, ROUTINE W REFLEX MICROSCOPIC
Bilirubin Urine: NEGATIVE
Glucose, UA: NEGATIVE mg/dL
Hgb urine dipstick: NEGATIVE
Ketones, ur: NEGATIVE mg/dL
Nitrite: NEGATIVE
Protein, ur: NEGATIVE mg/dL
Specific Gravity, Urine: 1.01 (ref 1.005–1.030)
pH: 8 (ref 5.0–8.0)

## 2020-04-28 MED ORDER — TRAMADOL HCL 50 MG PO TABS
50.0000 mg | ORAL_TABLET | Freq: Once | ORAL | Status: AC
  Administered 2020-04-28: 50 mg via ORAL
  Filled 2020-04-28: qty 1

## 2020-04-28 MED ORDER — SODIUM CHLORIDE 0.9 % IV BOLUS
1000.0000 mL | Freq: Once | INTRAVENOUS | Status: AC
  Administered 2020-04-28: 1000 mL via INTRAVENOUS

## 2020-04-28 NOTE — ED Triage Notes (Signed)
Hx of depression with altered LOC. She is here today with altered LOC for over a week since staying with her brother in law. Not motivated to eat or shower. Constipated. Depression medication has never worked. Denies SI.

## 2020-04-29 DIAGNOSIS — E871 Hypo-osmolality and hyponatremia: Secondary | ICD-10-CM | POA: Diagnosis not present

## 2020-04-29 LAB — TSH: TSH: 2.459 u[IU]/mL (ref 0.350–4.500)

## 2020-04-29 IMAGING — CT CT HEAD W/O CM
3 of 4 series · 15 of 47 positions shown, 18 images · non-contrast
Comparison: [DATE]

CLINICAL DATA: Altered level of consciousness for over a week.

EXAM:
CT HEAD WITHOUT CONTRAST
TECHNIQUE: Contiguous axial images were obtained from the base of the skull
through the vertex without intravenous contrast.

[Series 2: head wo · axial · 0.42mm/px · z∈[+720,+840]mm · 9 of 28 slices shown, 12 images]
[im 2/28  brain]
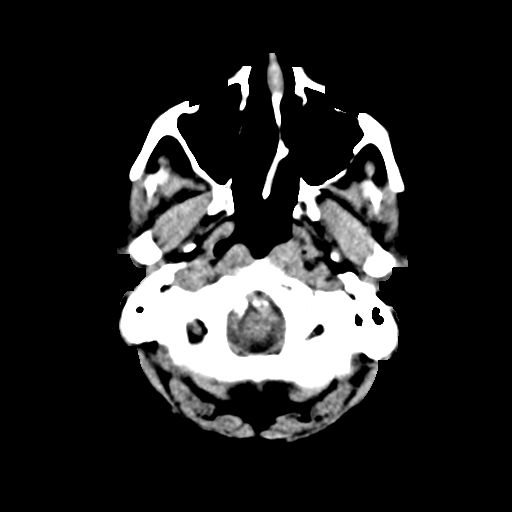
[im 2/28  bone]
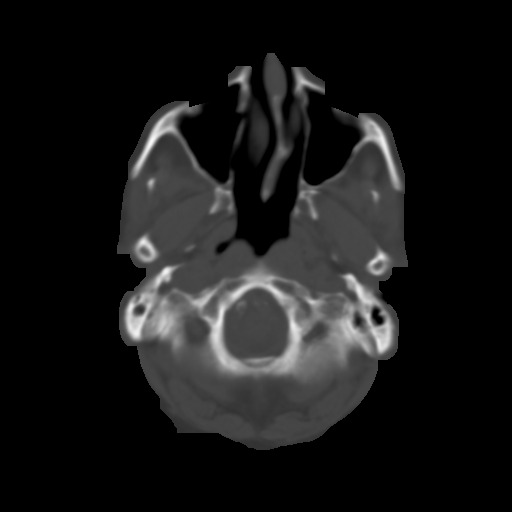
[im 5/28  brain]
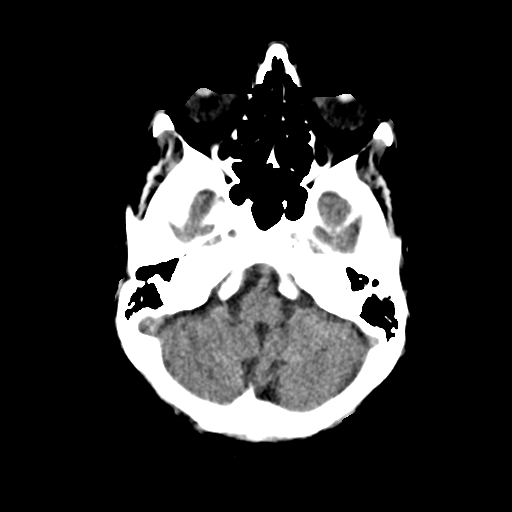
[im 8/28  brain]
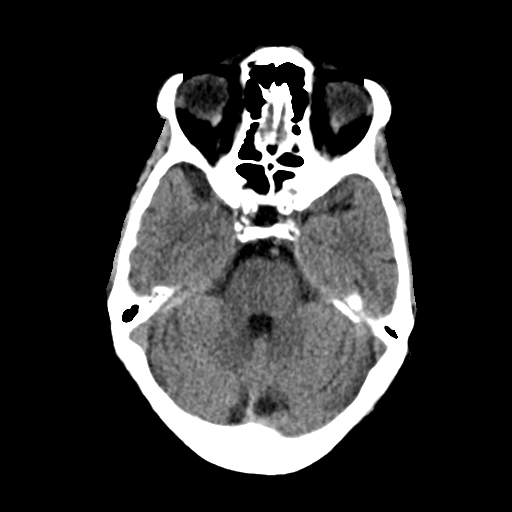
[im 11/28  brain]
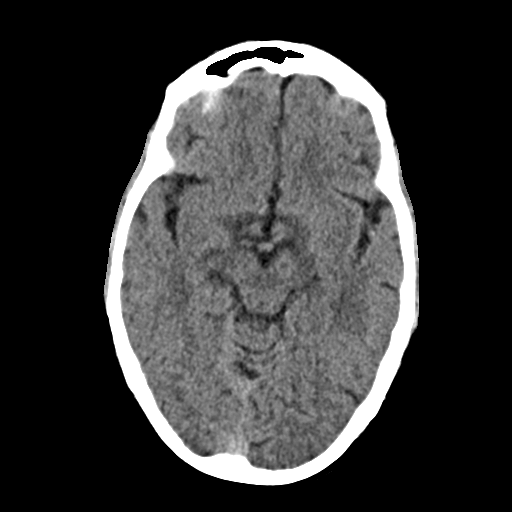
[im 14/28  brain]
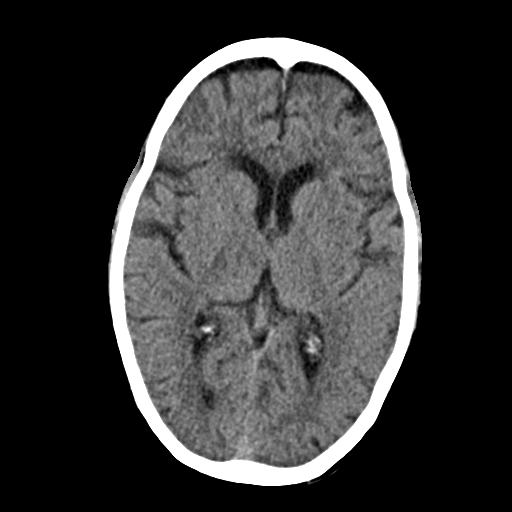
[im 14/28  bone]
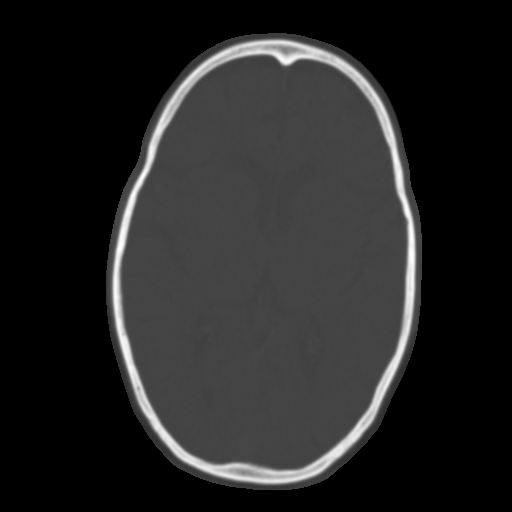
[im 17/28  brain]
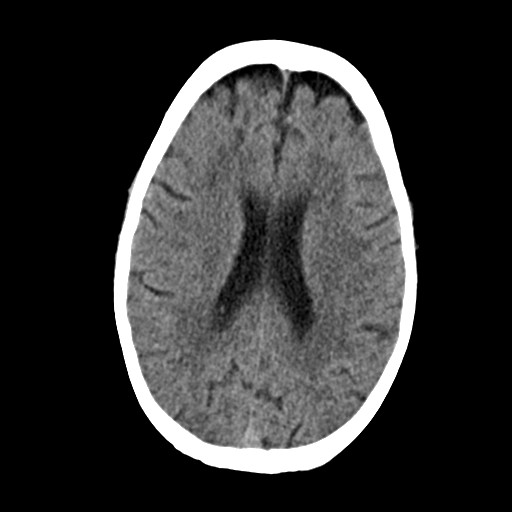
[im 20/28  brain]
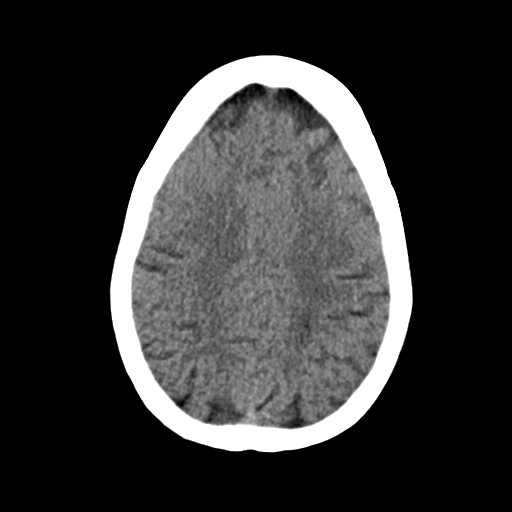
[im 23/28  brain]
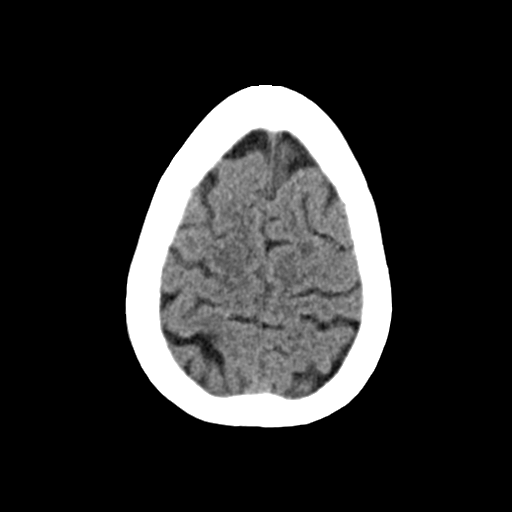
[im 26/28  brain]
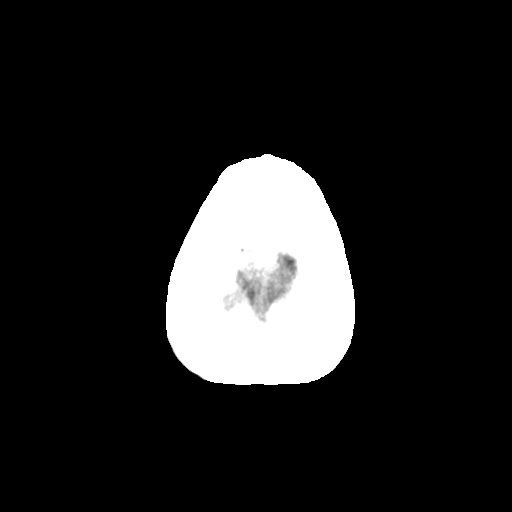
[im 26/28  bone]
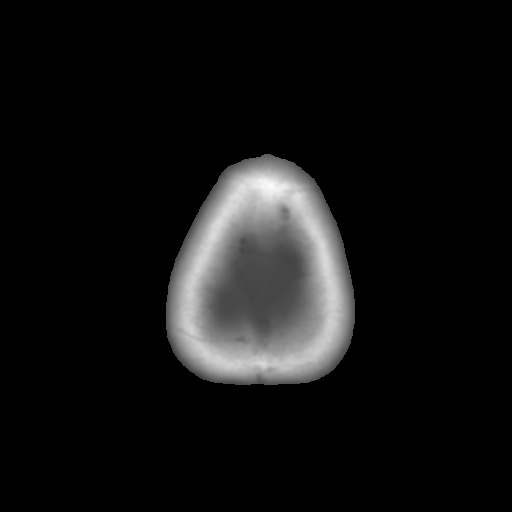

[Series 4: coronal soft · coronal · 0.31mm/px · 3 of 71 slices shown]
[im 24/71  brain]
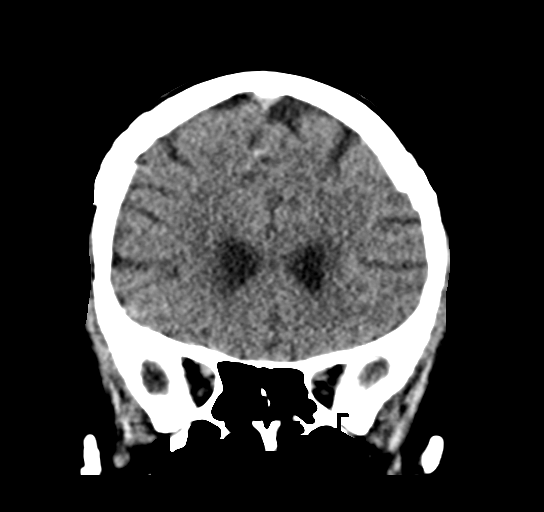
[im 32/71  brain]
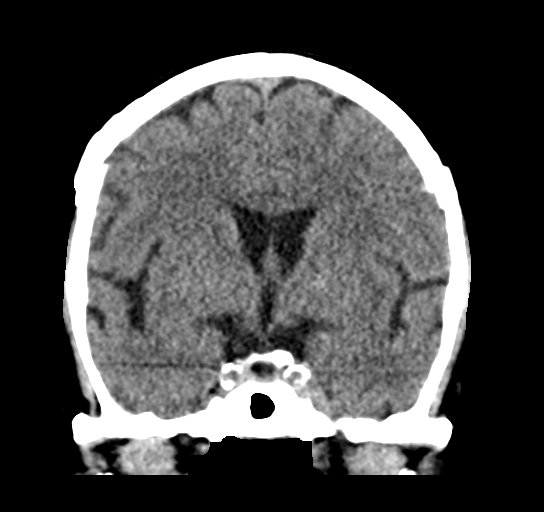
[im 39/71  brain]
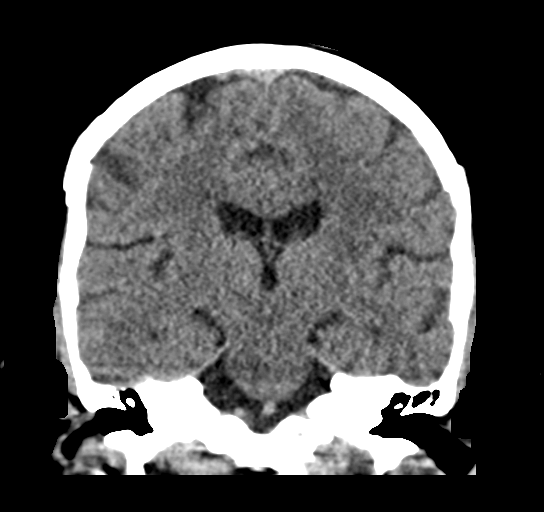

[Series 5: sag soft · sagittal · 0.29mm/px · 3 of 56 slices shown]
[im 19/56  brain]
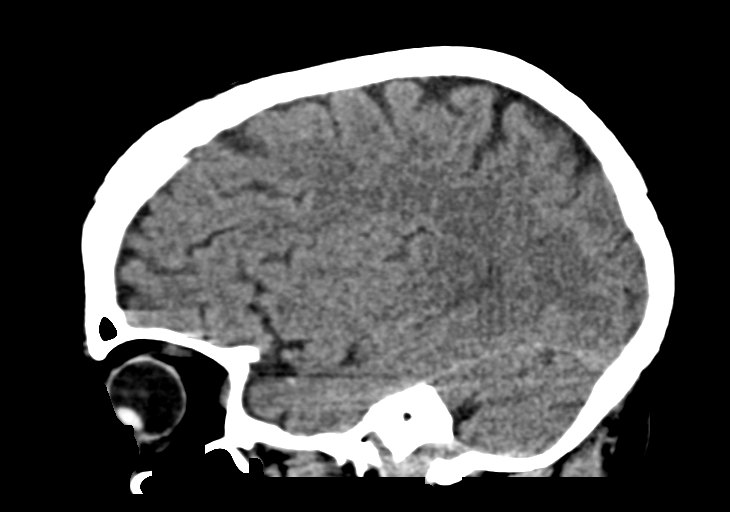
[im 28/56  brain]
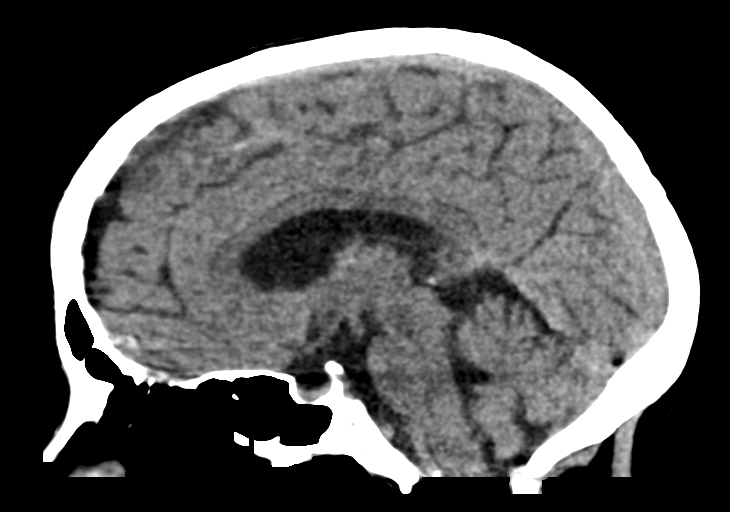
[im 37/56  brain]
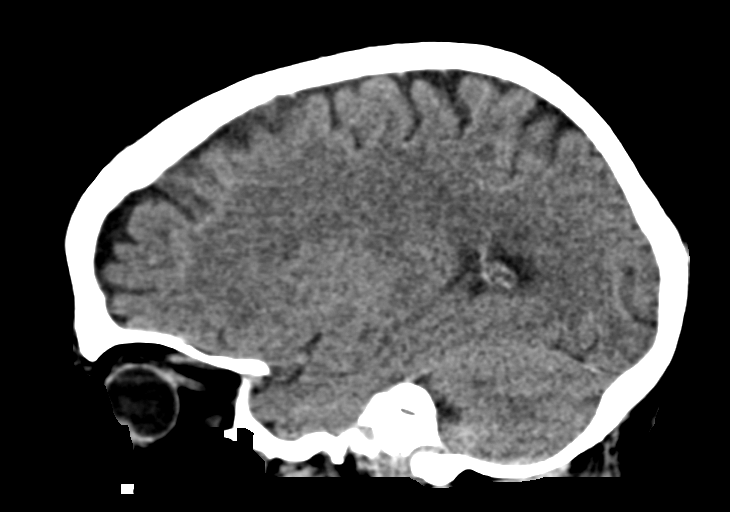

[15 of 47 positions shown; findings below may reference images not displayed]

FINDINGS: Brain: Mild cerebral atrophy. Low-attenuation changes in the deep
white matter consistent with small vessel ischemia. No mass-effect
or midline shift. No abnormal extra-axial fluid collections.
Gray-white matter junctions are distinct. Basal cisterns are not
effaced. No acute intracranial hemorrhage.

Vascular: Moderate intracranial arterial vascular calcifications.

Skull: Calvarium appears intact.

Sinuses/Orbits: Paranasal sinuses and mastoid air cells are clear.

Other: None.
IMPRESSION: 1. No acute intracranial abnormalities.
2. Mild cerebral atrophy and small vessel ischemic changes.

## 2020-04-29 MED ORDER — LORAZEPAM 1 MG PO TABS: 1.0000 mg | ORAL_TABLET | Freq: Every evening | ORAL | 0 refills | Status: DC | PRN

## 2020-04-29 MED ORDER — LORAZEPAM 1 MG PO TABS
1.0000 mg | ORAL_TABLET | Freq: Once | ORAL | Status: AC
  Administered 2020-04-29: 1 mg via ORAL
  Filled 2020-04-29: qty 1

## 2020-04-29 NOTE — Discharge Instructions (Addendum)
You were seen today for confusion and agitation.  Your sodium is low.  Increase sodium in your diet and make sure that you're eating a well-balanced meal.  You may take Ativan as needed at night for agitation.  Follow-up closely with your primary physician for recheck of sodium.

## 2020-04-29 NOTE — ED Provider Notes (Signed)
[facility] EMERGENCY DEPARTMENT Provider Note   CSN: [id number] Arrival date & time: 04/28/20  1932     History Chief Complaint  Patient presents with  . Altered Mental Status    Kim [name] is a 74 y.o. female.  HPI     This is a 74 year old female with a history of hypertension, hyperlipidemia, depression who presents with her daughter with concerns for altered level of consciousness.  Daughter reports that she lives with her brother.  She is noted over the last 1 to 2 weeks decreased motivation to eat or drink.  She states that they have to force her to shower.  She has not had any recent illnesses or fevers that she knows of.  She is only complaining of bilateral leg pain that has a history of restless leg and has not taken her medication tonight.  She states that she got her an appointment with her primary physician but they were unable to be seen until next week.  She is concerned because she is "failing to thrive."  Patient herself denies chest pain, shortness of breath, fevers, abdominal pain, nausea, vomiting.  Only complaining of bilateral leg pain.  Daughter reports that she has stated repetitively that "something is wrong in my head."  Past Medical History:  Diagnosis Date  . Arthritis   . Depression   . Hyperlipidemia   . Hypertension     There are no problems to display for this patient.   History reviewed. No pertinent surgical history.   OB History   No obstetric history on file.     No family history on file.  Social History   Tobacco Use  . Smoking status: Never Smoker  . Smokeless tobacco: Never Used  Substance Use Topics  . Alcohol use: No  . Drug use: No    Home Medications Prior to Admission medications   Medication Sig Start Date End Date Taking? Authorizing Provider  alendronate (FOSAMAX) 70 MG tablet TAKE 1 TABLET(70 MG) BY MOUTH 1 TIME A WEEK 03/03/20  Yes [provider]  amLODipine (NORVASC) 5 MG tablet TAKE 1  TABLET(5 MG) BY MOUTH DAILY 05/14/15  Yes [provider]  azaTHIOprine (IMURAN) 50 MG tablet Take by mouth. 03/27/20  Yes [provider]  diclofenac Sodium (VOLTAREN) 1 % GEL Apply topically. 06/24/19  Yes [provider]  hydrALAZINE (APRESOLINE) 25 MG tablet TAKE 1 TABLET (25MG ) BY MOUTH THREE TIMES DAILY. 03/20/20  Yes [provider]  hydroxychloroquine (PLAQUENIL) 200 MG tablet Take by mouth. 03/27/20 06/25/20 Yes [provider]  simvastatin (ZOCOR) 20 MG tablet TAKE 1 TABLET(20 MG) BY MOUTH EVERY NIGHT 06/07/19  Yes [provider]  traMADol (ULTRAM) 50 MG tablet Take by mouth. 01/08/20  Yes [provider]  Valsartan (DIOVAN PO) Take by mouth daily.   Yes [provider]  acetaminophen (TYLENOL) 500 MG tablet Take 2 tablets (1,000 mg total) by mouth every 6 (six) hours as needed. 01/01/18   01/03/18, MD  amoxicillin-clavulanate (AUGMENTIN) 875-125 MG tablet Take 1 tablet by mouth 2 (two) times daily. One po bid x 7 days 01/01/18   01/03/18, MD  HYDROcodone-acetaminophen (NORCO/VICODIN) 5-325 MG per tablet Take 1 tablet by mouth every 6 (six) hours as needed for pain.    [provider]  HYDROcodone-acetaminophen (NORCO/VICODIN) 5-325 MG per tablet Take 1 tablet by mouth every 4 (four) hours as needed for pain. 10/26/12   10/28/12, MD  LORazepam (ATIVAN) 1 MG  tablet Take 1 tablet (1 mg total) by mouth at bedtime as needed for anxiety. 04/29/20   Shevette Bess, Mayer Masker, MD  PRAVASTATIN SODIUM PO Take by mouth daily.    [provider]    Allergies    Gabapentin, Labetalol, Lisinopril, and Nebivolol  Review of Systems   Review of Systems  Constitutional: Negative for fever.  Respiratory: Negative for shortness of breath.   Cardiovascular: Negative for chest pain.  Gastrointestinal: Negative for abdominal pain, nausea and vomiting.  Genitourinary: Negative for dysuria.    Psychiatric/Behavioral: Positive for behavioral problems. Negative for confusion.  All other systems reviewed and are negative.   Physical Exam Updated Vital Signs BP (!) 131/102 (BP Location: Right Arm)   Pulse 85   Temp 97.8 F (36.6 C) (Oral)   Resp 19   Ht 1.524 m (5')   Wt 56.4 kg   SpO2 100%   BMI 24.28 kg/m   Physical Exam Vitals and nursing note reviewed.  Constitutional:      Appearance: She is well-developed.     Comments: Agitated appearing but nontoxic, no acute distress  HENT:     Head: Normocephalic and atraumatic.     Nose: Nose normal.     Mouth/Throat:     Mouth: Mucous membranes are dry.  Eyes:     Pupils: Pupils are equal, round, and reactive to light.  Cardiovascular:     Rate and Rhythm: Normal rate and regular rhythm.     Heart sounds: Normal heart sounds.  Pulmonary:     Effort: Pulmonary effort is normal. No respiratory distress.     Breath sounds: No wheezing.  Abdominal:     General: Bowel sounds are normal.     Palpations: Abdomen is soft.     Tenderness: There is no abdominal tenderness.  Musculoskeletal:        General: No tenderness.     Cervical back: Neck supple.     Right lower leg: No edema.     Left lower leg: No edema.  Skin:    General: Skin is warm and dry.  Neurological:     Mental Status: She is alert and oriented to person, place, and time.  Psychiatric:     Comments: Agitated but directable     ED Results / Procedures / Treatments   Labs (all labs ordered are listed, but only abnormal results are displayed) Labs Reviewed  COMPREHENSIVE METABOLIC PANEL - Abnormal; Notable for the following components:      Result Value   Sodium 125 (*)    Potassium 3.3 (*)    Chloride 90 (*)    All other components within normal limits  URINALYSIS, ROUTINE W REFLEX MICROSCOPIC - Abnormal; Notable for the following components:   Leukocytes,Ua MODERATE (*)    All other components within normal limits  URINALYSIS, MICROSCOPIC  (REFLEX) - Abnormal; Notable for the following components:   Bacteria, UA FEW (*)    All other components within normal limits  CBC WITH DIFFERENTIAL/PLATELET  TSH    EKG EKG Interpretation  Date/Time:  Tuesday April 28 2020 20:11:51 EDT Ventricular Rate:  88 PR Interval:  166 QRS Duration: 124 QT Interval:  394 QTC Calculation: 476 R Axis:   -73 Text Interpretation: Normal sinus rhythm Left anterior fascicular block Left ventricular hypertrophy with QRS widening and repolarization abnormality ( R in aVL , Cornell product , Romhilt-Estes ) Abnormal ECG No prior for comparison Confirmed by Ross Marcus ([id number]) on 04/28/2020 11:01:58 PM  Radiology CT Head Wo Contrast  Result Date: 04/29/2020 CLINICAL DATA:  Altered level of consciousness for over a week. EXAM: CT HEAD WITHOUT CONTRAST TECHNIQUE: Contiguous axial images were obtained from the base of the skull through the vertex without intravenous contrast. COMPARISON:  01/01/2018 FINDINGS: Brain: Mild cerebral atrophy. Low-attenuation changes in the deep white matter consistent with small vessel ischemia. No mass-effect or midline shift. No abnormal extra-axial fluid collections. Gray-white matter junctions are distinct. Basal cisterns are not effaced. No acute intracranial hemorrhage. Vascular: Moderate intracranial arterial vascular calcifications. Skull: Calvarium appears intact. Sinuses/Orbits: Paranasal sinuses and mastoid air cells are clear. Other: None. IMPRESSION: 1. No acute intracranial abnormalities. 2. Mild cerebral atrophy and small vessel ischemic changes. Electronically Signed   By: Burman Nieves M.D.   On: 04/29/2020 00:29    Procedures Procedures (including critical care time)  Medications Ordered in ED Medications  sodium chloride 0.9 % bolus 1,000 mL ( Intravenous Stopped 04/29/20 0107)  traMADol (ULTRAM) tablet 50 mg (50 mg Oral Given 04/28/20 2348)  LORazepam (ATIVAN) tablet 1 mg (1 mg Oral Given  04/29/20 0215)    ED Course  I have reviewed the triage vital signs and the nursing notes.  Pertinent labs & imaging results that were available during my care of the patient were reviewed by me and considered in my medical decision making (see chart for details).    MDM Rules/Calculators/A&P                          Patient presents with reported failure to thrive, decreased oral intake and agitation.  She is agitated on exam but cooperative.  She reports lower extremity leg pain but no other symptoms.  Vital signs are reviewed and are largely reassuring.  Work-up initiated.  Labs reviewed labs most notable for hyponatremia with a sodium of 125.  Chloride 90 and potassium 3.3.  She clinically appears dry.  She was given 1 L of normal saline.  No evidence of UTI.  No significant leukocytosis.  O2 sats are 100% and her pulmonary exam is clear.  Doubt pneumonia or infectious etiology.  CT scan of the head obtained and shows no obvious intracranial mass, bleed.  She is not exhibiting any strokelike symptoms.  Patient was initially given tramadol as she takes this at night for her restless leg.  She was subsequently given Ativan and on recheck, appears much more comfortable.  They are requesting discharge.  I stressed to them that the level of hyponatremia was moderate and that she needs very close follow-up.  She needs to increase salt in her diet and eat well-balanced meals.  Daughter and mother state understanding.  After history, exam, and medical workup I feel the patient has been appropriately medically screened and is safe for discharge home. Pertinent diagnoses were discussed with the patient. Patient was given return precautions.  Final Clinical Impression(s) / ED Diagnoses Final diagnoses:  Hyponatremia  Agitation    Rx / DC Orders ED Discharge Orders         Ordered    LORazepam (ATIVAN) 1 MG tablet  At bedtime PRN        04/29/20 0253           Shon Baton, MD 04/29/20  [phone]

## 2021-06-26 ENCOUNTER — Emergency Department (HOSPITAL_BASED_OUTPATIENT_CLINIC_OR_DEPARTMENT_OTHER): Payer: Medicare Other

## 2021-06-26 ENCOUNTER — Inpatient Hospital Stay (HOSPITAL_BASED_OUTPATIENT_CLINIC_OR_DEPARTMENT_OTHER)
Admission: EM | Admit: 2021-06-26 | Discharge: 2021-06-30 | DRG: 065 | Disposition: A | Discharge status: Rehabilitation Facility | Payer: Medicare Other | Attending: Internal Medicine | Admitting: Internal Medicine

## 2021-06-26 ENCOUNTER — Encounter (HOSPITAL_BASED_OUTPATIENT_CLINIC_OR_DEPARTMENT_OTHER): Payer: Self-pay | Admitting: Emergency Medicine

## 2021-06-26 ENCOUNTER — Other Ambulatory Visit: Payer: Self-pay

## 2021-06-26 DIAGNOSIS — M1990 Unspecified osteoarthritis, unspecified site: Secondary | ICD-10-CM | POA: Diagnosis present

## 2021-06-26 DIAGNOSIS — E871 Hypo-osmolality and hyponatremia: Secondary | ICD-10-CM | POA: Diagnosis present

## 2021-06-26 DIAGNOSIS — R29818 Other symptoms and signs involving the nervous system: Secondary | ICD-10-CM

## 2021-06-26 DIAGNOSIS — Z20822 Contact with and (suspected) exposure to covid-19: Secondary | ICD-10-CM | POA: Diagnosis present

## 2021-06-26 DIAGNOSIS — I63542 Cerebral infarction due to unspecified occlusion or stenosis of left cerebellar artery: Principal | ICD-10-CM | POA: Diagnosis present

## 2021-06-26 DIAGNOSIS — R9389 Abnormal findings on diagnostic imaging of other specified body structures: Secondary | ICD-10-CM | POA: Diagnosis present

## 2021-06-26 DIAGNOSIS — R739 Hyperglycemia, unspecified: Secondary | ICD-10-CM | POA: Diagnosis present

## 2021-06-26 DIAGNOSIS — G2581 Restless legs syndrome: Secondary | ICD-10-CM | POA: Diagnosis present

## 2021-06-26 DIAGNOSIS — E876 Hypokalemia: Secondary | ICD-10-CM | POA: Diagnosis present

## 2021-06-26 DIAGNOSIS — I429 Cardiomyopathy, unspecified: Secondary | ICD-10-CM | POA: Diagnosis present

## 2021-06-26 DIAGNOSIS — R2990 Unspecified symptoms and signs involving the nervous system: Secondary | ICD-10-CM | POA: Diagnosis present

## 2021-06-26 DIAGNOSIS — M810 Age-related osteoporosis without current pathological fracture: Secondary | ICD-10-CM | POA: Diagnosis present

## 2021-06-26 DIAGNOSIS — M329 Systemic lupus erythematosus, unspecified: Secondary | ICD-10-CM | POA: Diagnosis present

## 2021-06-26 DIAGNOSIS — R42 Dizziness and giddiness: Secondary | ICD-10-CM

## 2021-06-26 DIAGNOSIS — R4182 Altered mental status, unspecified: Secondary | ICD-10-CM | POA: Diagnosis not present

## 2021-06-26 DIAGNOSIS — E782 Mixed hyperlipidemia: Secondary | ICD-10-CM | POA: Diagnosis present

## 2021-06-26 DIAGNOSIS — Z888 Allergy status to other drugs, medicaments and biological substances status: Secondary | ICD-10-CM

## 2021-06-26 DIAGNOSIS — Z79899 Other long term (current) drug therapy: Secondary | ICD-10-CM

## 2021-06-26 DIAGNOSIS — R29709 NIHSS score 9: Secondary | ICD-10-CM | POA: Diagnosis present

## 2021-06-26 DIAGNOSIS — I10 Essential (primary) hypertension: Secondary | ICD-10-CM | POA: Diagnosis present

## 2021-06-26 DIAGNOSIS — Z7983 Long term (current) use of bisphosphonates: Secondary | ICD-10-CM

## 2021-06-26 LAB — COMPREHENSIVE METABOLIC PANEL
ALT: 21 U/L (ref 0–44)
AST: 32 U/L (ref 15–41)
Albumin: 4.5 g/dL (ref 3.5–5.0)
Alkaline Phosphatase: 70 U/L (ref 38–126)
Anion gap: 15 (ref 5–15)
BUN: 18 mg/dL (ref 8–23)
CO2: 18 mmol/L — ABNORMAL LOW (ref 22–32)
Calcium: 9.5 mg/dL (ref 8.9–10.3)
Chloride: 97 mmol/L — ABNORMAL LOW (ref 98–111)
Creatinine, Ser: 0.65 mg/dL (ref 0.44–1.00)
GFR, Estimated: >60 mL/min (ref >60)
Glucose, Bld: 219 mg/dL — ABNORMAL HIGH (ref 70–99)
Potassium: 2.9 mmol/L — ABNORMAL LOW (ref 3.5–5.1)
Sodium: 130 mmol/L — ABNORMAL LOW (ref 135–145)
Total Bilirubin: 0.5 mg/dL (ref 0.3–1.2)
Total Protein: 8.6 g/dL — ABNORMAL HIGH (ref 6.5–8.1)

## 2021-06-26 LAB — DIFFERENTIAL
Abs Immature Granulocytes: 0.05 10*3/uL (ref 0.00–0.07)
Basophils Absolute: 0 10*3/uL (ref 0.0–0.1)
Basophils Relative: 0 %
Eosinophils Absolute: 0 10*3/uL (ref 0.0–0.5)
Eosinophils Relative: 0 %
Immature Granulocytes: 1 %
Lymphocytes Relative: 11 %
Lymphs Abs: 0.8 10*3/uL (ref 0.7–4.0)
Monocytes Absolute: 0.6 10*3/uL (ref 0.1–1.0)
Monocytes Relative: 8 %
Neutro Abs: 6.1 10*3/uL (ref 1.7–7.7)
Neutrophils Relative %: 80 %

## 2021-06-26 LAB — URINALYSIS, MICROSCOPIC (REFLEX)

## 2021-06-26 LAB — URINALYSIS, ROUTINE W REFLEX MICROSCOPIC
Bilirubin Urine: NEGATIVE
Glucose, UA: 250 mg/dL — AB
Ketones, ur: NEGATIVE mg/dL
Leukocytes,Ua: NEGATIVE
Nitrite: NEGATIVE
Protein, ur: NEGATIVE mg/dL
Specific Gravity, Urine: 1.015 (ref 1.005–1.030)
pH: 7.5 (ref 5.0–8.0)

## 2021-06-26 LAB — RAPID URINE DRUG SCREEN, HOSP PERFORMED
Amphetamines: NOT DETECTED
Barbiturates: NOT DETECTED
Benzodiazepines: NOT DETECTED
Cocaine: NOT DETECTED
Opiates: NOT DETECTED
Tetrahydrocannabinol: NOT DETECTED

## 2021-06-26 LAB — PROTIME-INR
INR: 1 (ref 0.8–1.2)
Prothrombin Time: 12.8 seconds (ref 11.4–15.2)

## 2021-06-26 LAB — RESP PANEL BY RT-PCR (FLU A&B, COVID) ARPGX2
Influenza A by PCR: NEGATIVE
Influenza B by PCR: NEGATIVE
SARS Coronavirus 2 by RT PCR: NEGATIVE

## 2021-06-26 LAB — CBC
HCT: 33.9 % — ABNORMAL LOW (ref 36.0–46.0)
Hemoglobin: 11.8 g/dL — ABNORMAL LOW (ref 12.0–15.0)
MCH: 32.2 pg (ref 26.0–34.0)
MCHC: 34.8 g/dL (ref 30.0–36.0)
MCV: 92.4 fL (ref 80.0–100.0)
Platelets: 310 10*3/uL (ref 150–400)
RBC: 3.67 MIL/uL — ABNORMAL LOW (ref 3.87–5.11)
RDW: 12.7 % (ref 11.5–15.5)
WBC: 7.6 10*3/uL (ref 4.0–10.5)
nRBC: 0 % (ref 0.0–0.2)

## 2021-06-26 LAB — CBG MONITORING, ED: Glucose-Capillary: 211 mg/dL — ABNORMAL HIGH (ref 70–99)

## 2021-06-26 LAB — APTT: aPTT: 31 seconds (ref 24–36)

## 2021-06-26 LAB — ETHANOL: Alcohol, Ethyl (B): <10 mg/dL (ref <10)

## 2021-06-26 IMAGING — CT CT ANGIO NECK
2 of 8 series · 8 of 36 positions shown · IV contrast (Omnipaque)
Comparison: No prior CTA, correlation is made with CT head
[DATE] and [DATE]

CLINICAL DATA: Neuro deficit, stroke suspected, dizziness,
confusion

EXAM:
CT ANGIOGRAPHY HEAD AND NECK
TECHNIQUE: Multidetector CT imaging of the head and neck was performed using
the standard protocol during bolus administration of intravenous
contrast. Multiplanar CT image reconstructions and MIPs were
obtained to evaluate the vascular anatomy. Carotid stenosis
measurements (when applicable) are obtained utilizing NASCET
criteria, using the distal internal carotid diameter as the
denominator.
CONTRAST:  100mL OMNIPAQUE IOHEXOL 350 MG/ML SOLN

[Series 7: axial thin · axial · 0.51mm/px · z∈[-336,-84]mm · 6 of 356 slices shown]
[im 51/356  soft-tissue]
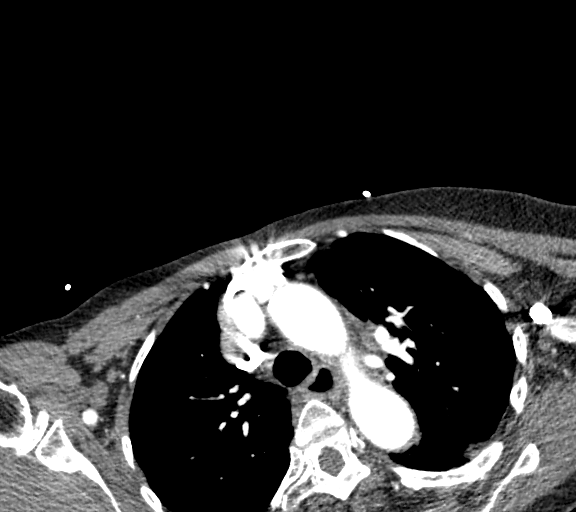
[im 102/356  bone]
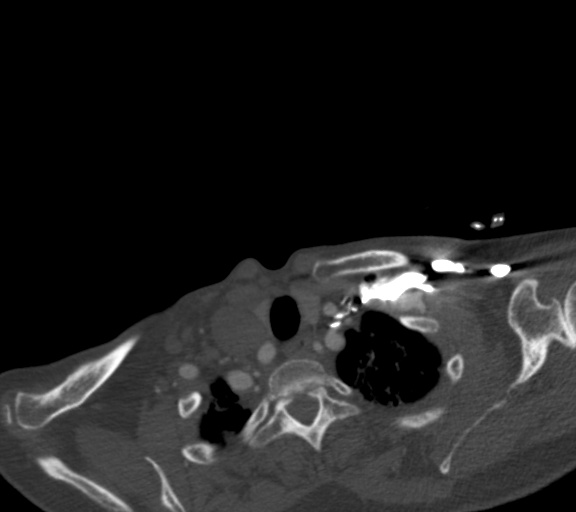
[im 153/356  soft-tissue]
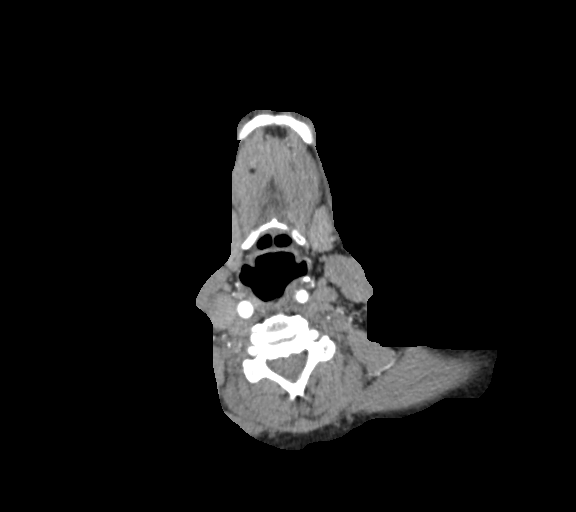
[im 203/356  bone]
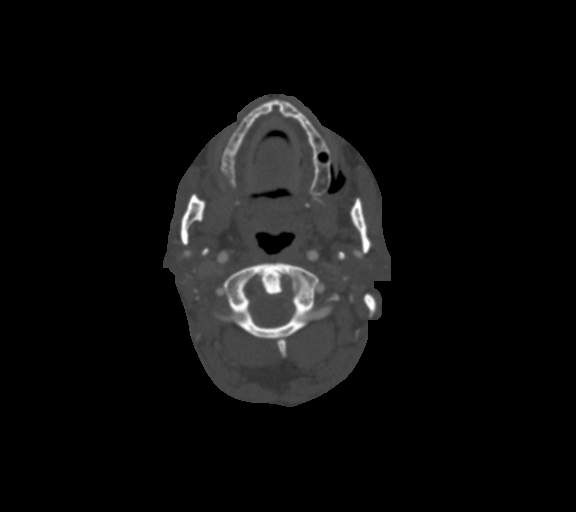
[im 254/356  soft-tissue]
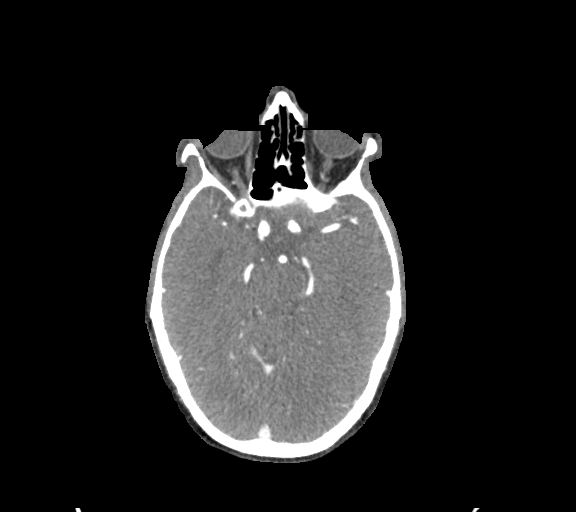
[im 305/356  bone]
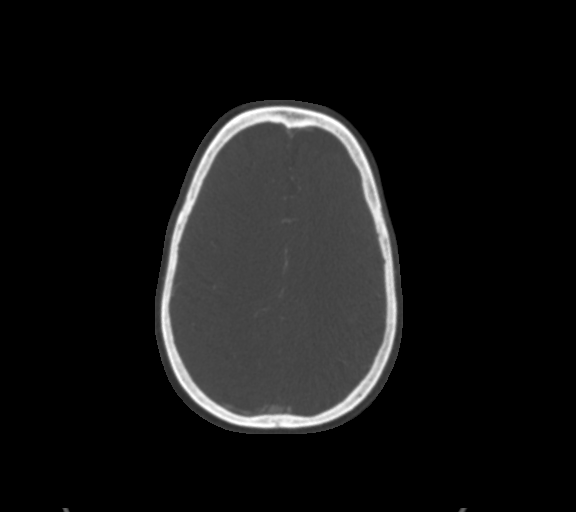

[Series 9: sagittal thin · sagittal · 0.59mm/px · 2 of 269 slices shown]
[im 93/269  soft-tissue]
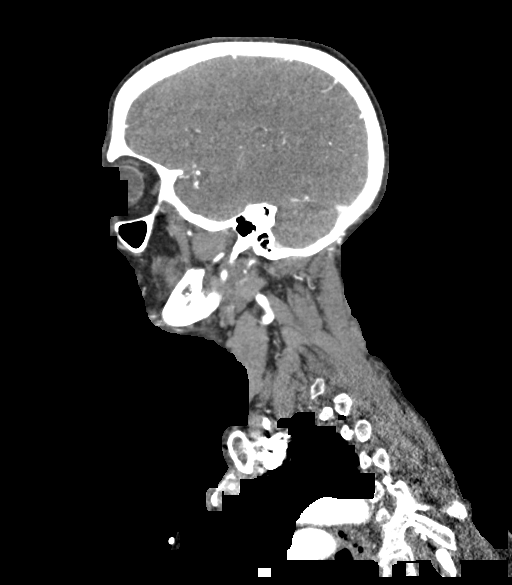
[im 177/269  soft-tissue]
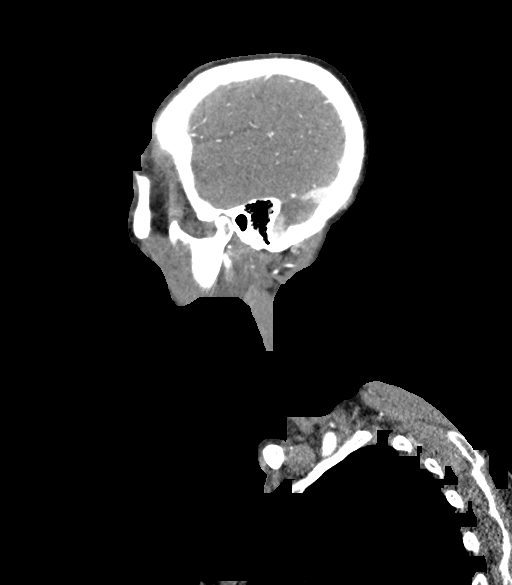

[8 of 36 positions shown; findings below may reference images not displayed]

FINDINGS: CT HEAD FINDINGS

For noncontrast findings, please see same day CT head.

CTA NECK FINDINGS

Aortic arch: Standard branching. Imaged portion shows no evidence of
aneurysm or dissection. No significant stenosis of the major arch
vessel origins.

Right carotid system: No evidence of dissection, stenosis (50% or
greater) or occlusion. Calcified plaque at the bifurcation and in
the proximal right ICA is not hemodynamically significant.
Retropharyngeal course of the right ICA.

Left carotid system: No evidence of dissection, stenosis (50% or
greater) or occlusion. Calcifications at the bifurcation and in the
proximal left ICA are not hemodynamically significant.

Vertebral arteries: Duplicated origin of the right vertebral artery
V1 segment which merge at the V2 segment at the level of C5-C6
(series 506, image 45). The bilateral extracranial vertebral
arteries are patent, without significant stenosis, dissection, or
occlusion.

Skeleton: Straightening and mild reversal of the normal cervical
lordosis. No acute osseous abnormality.

Other neck: Negative.

Upper chest: Apical pleural-parenchymal scarring. No focal pulmonary
opacity or pleural effusion.

Review of the MIP images confirms the above findings

CTA HEAD FINDINGS

Anterior circulation: Both internal carotid arteries are patent to
the termini, with mild calcifications in the cavernous and
supraclinoid segments that do not cause significant stenosis. Two mm
posteriorly directed outpouching from the right ICA terminus (series
506, image 214), favored to represent an infundibulum from the
origin of the right posterior communicating artery, which is not
otherwise visualized, possibly occluded.

A1 segments patent. Normal anterior communicating artery. Anterior
cerebral arteries are patent to their distal aspects.

No M1 stenosis or occlusion. Normal MCA bifurcations. Focal moderate
calcified stenosis in a left M2 branch (series 506, image 205).
Distal MCA branches otherwise perfused and symmetric.

Posterior circulation: Vertebral arteries are patent to the
vertebrobasilar junction, with mild calcified stenosis of the right
V4, distal to the PICA takeoff. Posterior inferior cerebral arteries
patent bilaterally. Basilar patent to its distal aspect. Superior
cerebellar arteries patent bilaterally. Mild narrowing of the right
P1 (series 506, image 195). Moderate narrowing of the proximal left
P2. Poor opacification of the more inferior left P3. The bilateral
posterior communicating arteries are not definitively visualized.

Venous sinuses: As permitted by contrast timing, patent.

Anatomic variants: None significant

Review of the MIP images confirms the above findings
IMPRESSION: 1. Two mm outpouching from the right ICA terminus, which is favored
to represent an infundibulum from the origin of the right posterior
communicating artery, which is not visualized, possibly occluded,
versus a very small aneurysm.
2. Focal moderate calcified stenosis in a left M2 branch.
3. Moderate narrowing in the left P2, with poor opacification of a
left P3 branch.
4. Incidental note is made of duplicated right V1 segments, which
merge at the level of C5-C6.
5.  No hemodynamically significant stenosis in the neck.

## 2021-06-26 IMAGING — CT CT ANGIO HEAD
2 of 8 series · 4 of 36 positions shown · IV contrast (Omnipaque)
Comparison: No prior CTA, correlation is made with CT head
[DATE] and [DATE]

CLINICAL DATA: Neuro deficit, stroke suspected, dizziness,
confusion

EXAM:
CT ANGIOGRAPHY HEAD AND NECK
TECHNIQUE: Multidetector CT imaging of the head and neck was performed using
the standard protocol during bolus administration of intravenous
contrast. Multiplanar CT image reconstructions and MIPs were
obtained to evaluate the vascular anatomy. Carotid stenosis
measurements (when applicable) are obtained utilizing NASCET
criteria, using the distal internal carotid diameter as the
denominator.
CONTRAST:  100mL OMNIPAQUE IOHEXOL 350 MG/ML SOLN

[Series 507: axial thin · axial · 0.51mm/px · z∈[-269,-152]mm · 2 of 356 slices shown]
[im 119/356  soft-tissue]
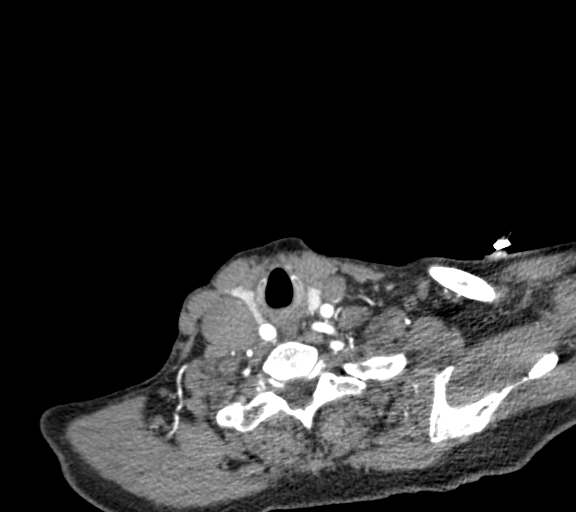
[im 237/356  bone]
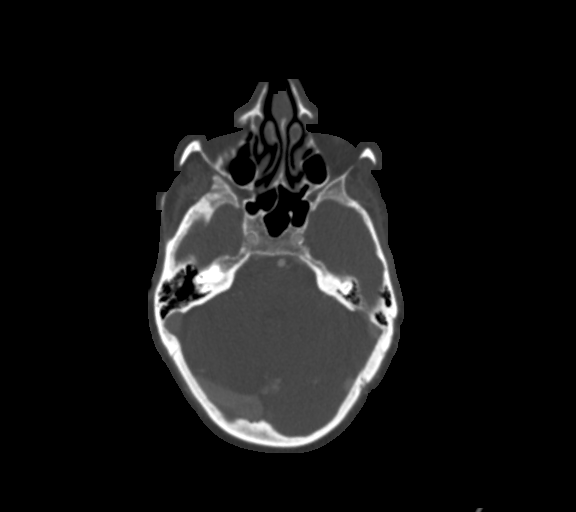

[Series 509: sagittal thin · sagittal · 0.59mm/px · 2 of 269 slices shown]
[im 93/269  soft-tissue]
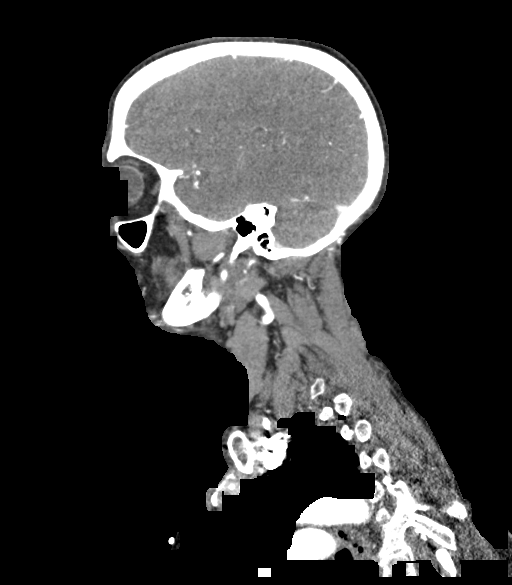
[im 177/269  soft-tissue]
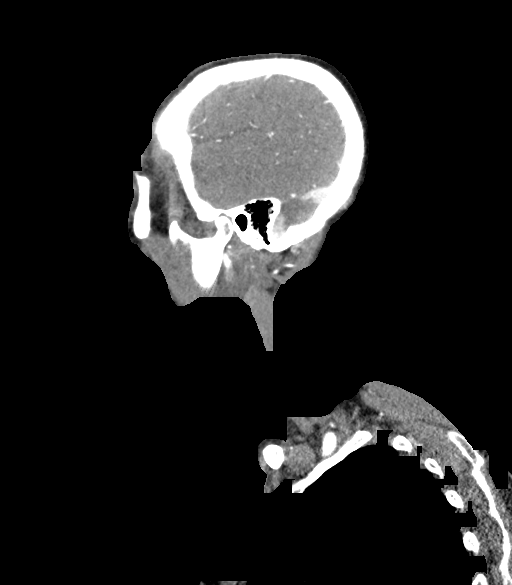

[4 of 36 positions shown; findings below may reference images not displayed]

FINDINGS: CT HEAD FINDINGS

For noncontrast findings, please see same day CT head.

CTA NECK FINDINGS

Aortic arch: Standard branching. Imaged portion shows no evidence of
aneurysm or dissection. No significant stenosis of the major arch
vessel origins.

Right carotid system: No evidence of dissection, stenosis (50% or
greater) or occlusion. Calcified plaque at the bifurcation and in
the proximal right ICA is not hemodynamically significant.
Retropharyngeal course of the right ICA.

Left carotid system: No evidence of dissection, stenosis (50% or
greater) or occlusion. Calcifications at the bifurcation and in the
proximal left ICA are not hemodynamically significant.

Vertebral arteries: Duplicated origin of the right vertebral artery
V1 segment which merge at the V2 segment at the level of C5-C6
(series 506, image 45). The bilateral extracranial vertebral
arteries are patent, without significant stenosis, dissection, or
occlusion.

Skeleton: Straightening and mild reversal of the normal cervical
lordosis. No acute osseous abnormality.

Other neck: Negative.

Upper chest: Apical pleural-parenchymal scarring. No focal pulmonary
opacity or pleural effusion.

Review of the MIP images confirms the above findings

CTA HEAD FINDINGS

Anterior circulation: Both internal carotid arteries are patent to
the termini, with mild calcifications in the cavernous and
supraclinoid segments that do not cause significant stenosis. Two mm
posteriorly directed outpouching from the right ICA terminus (series
506, image 214), favored to represent an infundibulum from the
origin of the right posterior communicating artery, which is not
otherwise visualized, possibly occluded.

A1 segments patent. Normal anterior communicating artery. Anterior
cerebral arteries are patent to their distal aspects.

No M1 stenosis or occlusion. Normal MCA bifurcations. Focal moderate
calcified stenosis in a left M2 branch (series 506, image 205).
Distal MCA branches otherwise perfused and symmetric.

Posterior circulation: Vertebral arteries are patent to the
vertebrobasilar junction, with mild calcified stenosis of the right
V4, distal to the PICA takeoff. Posterior inferior cerebral arteries
patent bilaterally. Basilar patent to its distal aspect. Superior
cerebellar arteries patent bilaterally. Mild narrowing of the right
P1 (series 506, image 195). Moderate narrowing of the proximal left
P2. Poor opacification of the more inferior left P3. The bilateral
posterior communicating arteries are not definitively visualized.

Venous sinuses: As permitted by contrast timing, patent.

Anatomic variants: None significant

Review of the MIP images confirms the above findings
IMPRESSION: 1. Two mm outpouching from the right ICA terminus, which is favored
to represent an infundibulum from the origin of the right posterior
communicating artery, which is not visualized, possibly occluded,
versus a very small aneurysm.
2. Focal moderate calcified stenosis in a left M2 branch.
3. Moderate narrowing in the left P2, with poor opacification of a
left P3 branch.
4. Incidental note is made of duplicated right V1 segments, which
merge at the level of C5-C6.
5.  No hemodynamically significant stenosis in the neck.

## 2021-06-26 IMAGING — CT CT HEAD CODE STROKE
2 of 4 series · 11 of 47 positions shown, 13 images · non-contrast
Comparison: [DATE]
COMPARISON: [DATE]

Addendum:
CLINICAL DATA: Code stroke.  Dizziness, confusion

EXAM:
CT HEAD WITHOUT CONTRAST
TECHNIQUE: Contiguous axial images were obtained from the base of the skull
through the vertex without intravenous contrast.

[Series 5: cor soft · coronal · 0.31mm/px · 3 of 71 slices shown]
[im 24/71  brain]
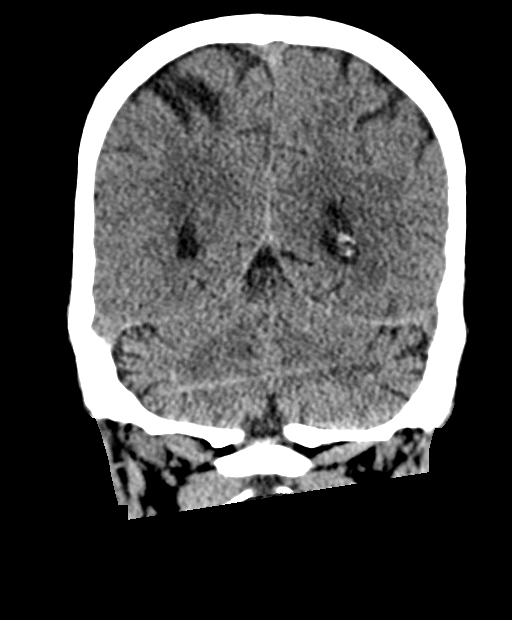
[im 32/71  brain]
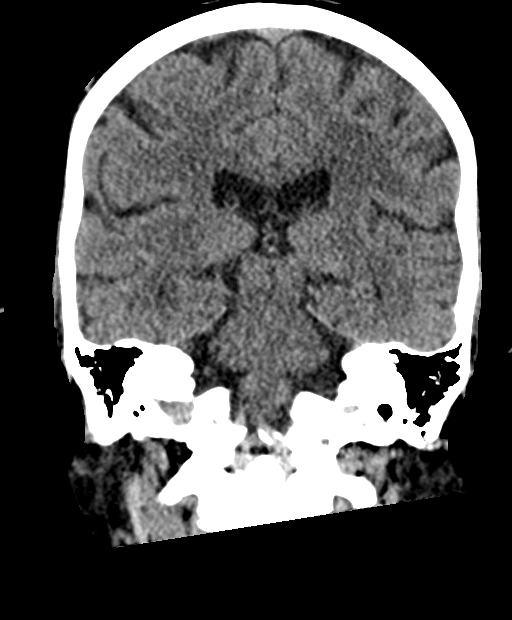
[im 39/71  brain]
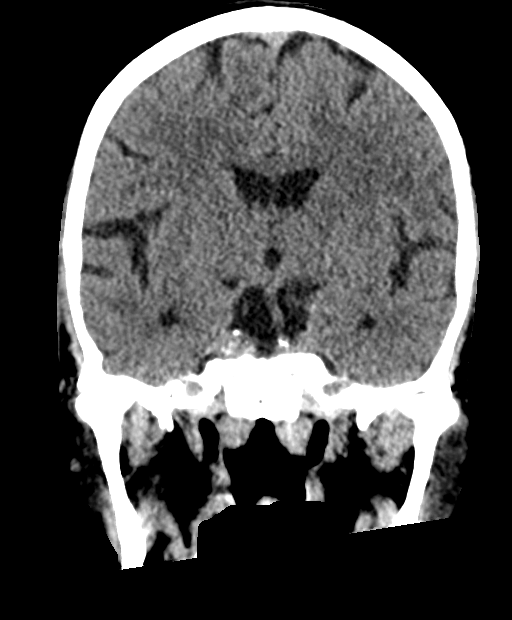

[Series 7: ax head wo · axial · 0.31mm/px · z∈[+913,+1068]mm · 8 of 38 slices shown, 10 images]
[im 3/38  brain]
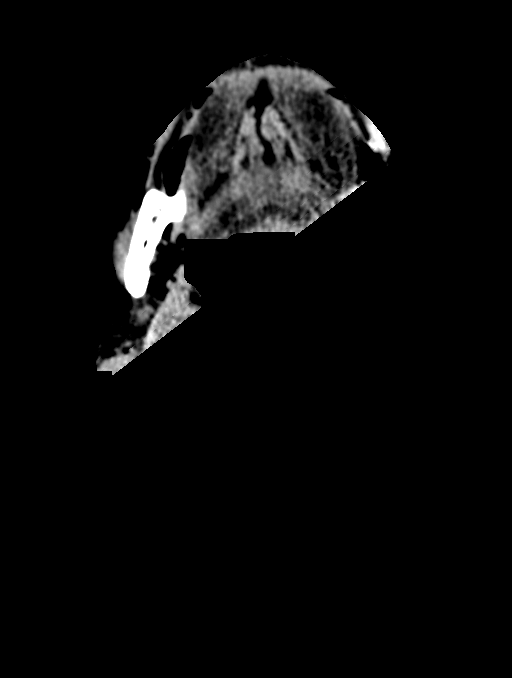
[im 3/38  bone]
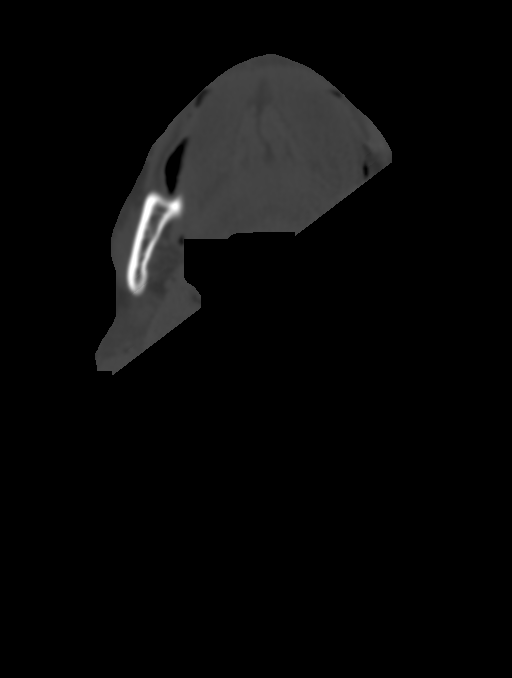
[im 8/38  brain]
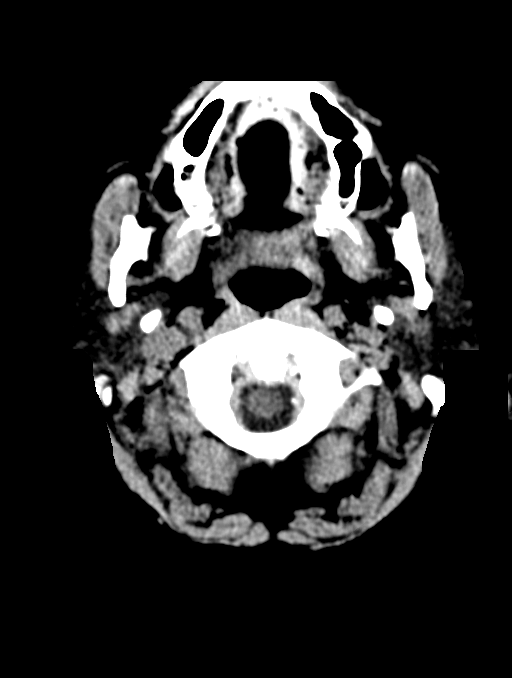
[im 13/38  brain]
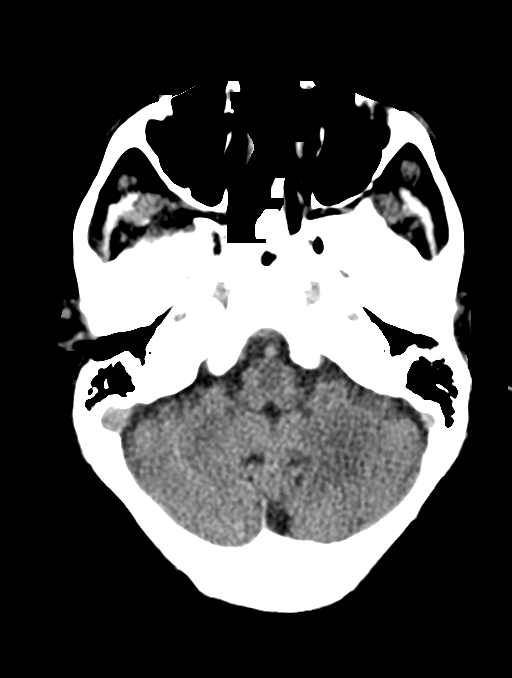
[im 18/38  brain]
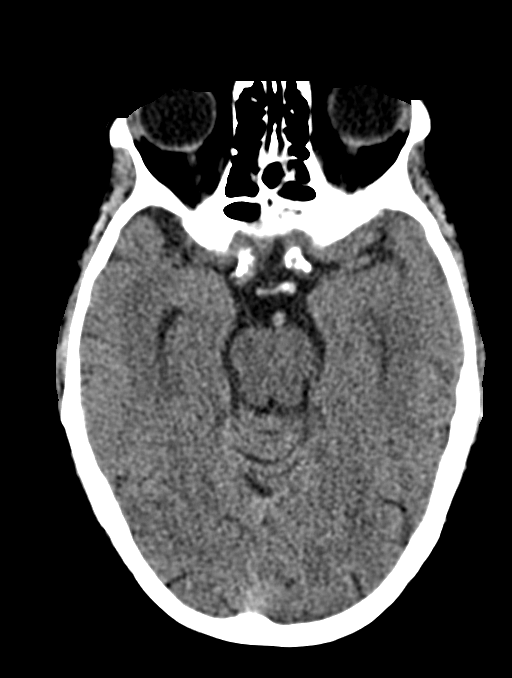
[im 20/38  brain]
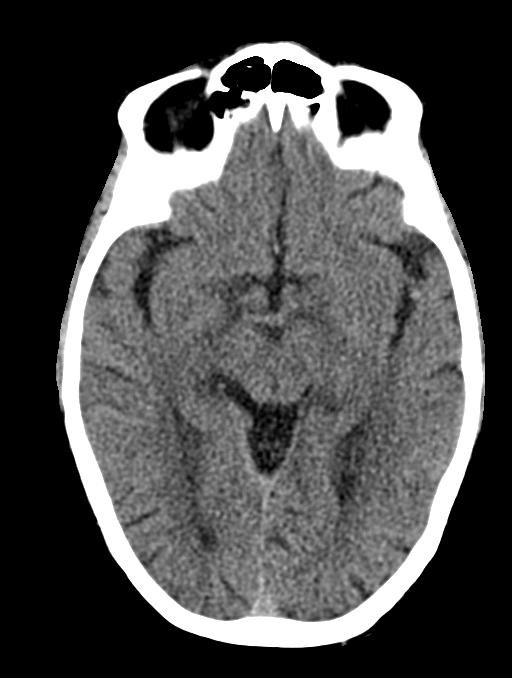
[im 20/38  bone]
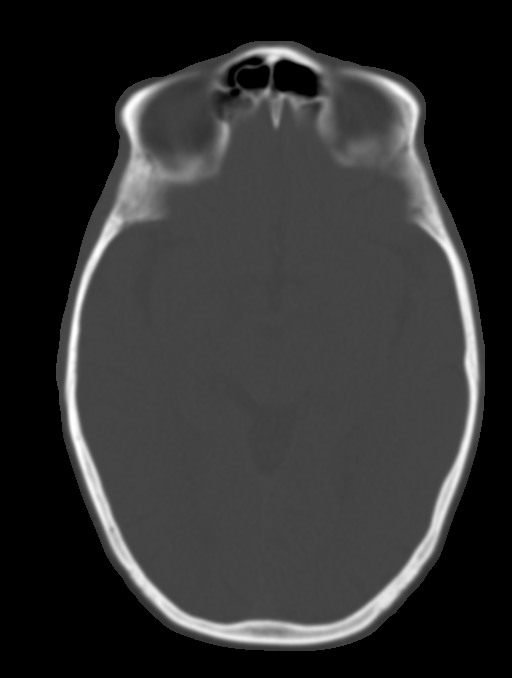
[im 25/38  brain]
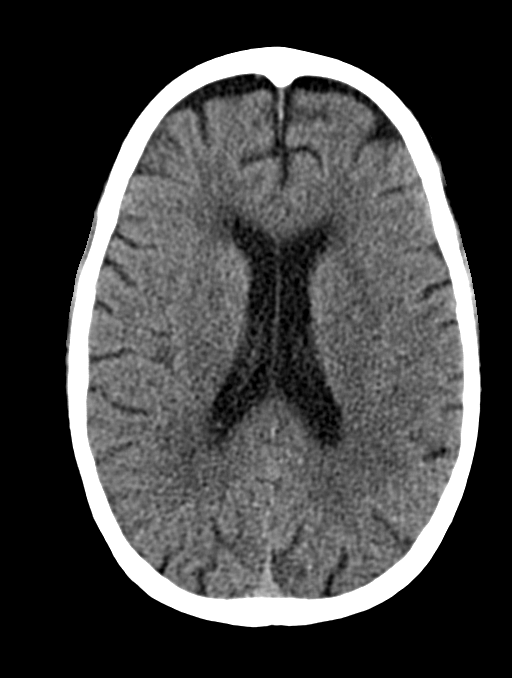
[im 30/38  brain]
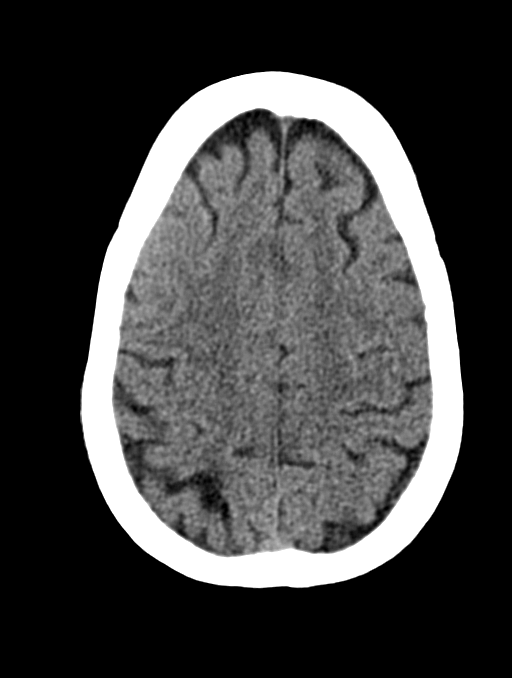
[im 35/38  brain]
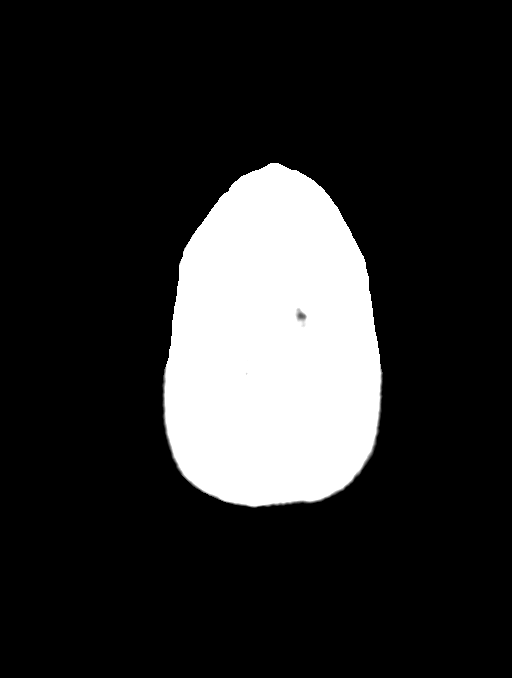

[11 of 47 positions shown; findings below may reference images not displayed]

FINDINGS: Brain: No evidence of acute infarction, hemorrhage, cerebral edema,
mass, mass effect, or midline shift. Ventricles and sulci are normal
for age. No extra-axial fluid collection. Small low-density foci in
the bilateral basal ganglia, likely lacunar infarcts, which are
favored to be unchanged from the prior exam.

Vascular: No hyperdense vessel or unexpected calcification.

Skull: Normal. Negative for fracture or focal lesion.

Sinuses/Orbits: No acute finding.

Other: The mastoid air cells are well aerated.

ASPECTS (Alberta Stroke Program Early CT Score)

- Ganglionic level infarction (caudate, lentiform nuclei, internal
capsule, insula, M1-M3 cortex): 7

- Supraganglionic infarction (M4-M6 cortex): 3

Total score (0-10 with 10 being normal): 10
IMPRESSION: 1. No acute intracranial process. Small low-density foci in the
bilateral basal ganglia, likely lacunar infarcts, which are favored
to be unchanged from the prior exam
2. ASPECTS is 10

Code stroke imaging results were communicated on [DATE] at [DATE] to provider Dr. DIELLZA via secure text paging.

ADDENDUM:
Code stroke imaging results were communicated on [DATE] at [DATE] to provider DIELLZA via telephone, who verbally acknowledged
these results.

*** End of Addendum ***
FINDINGS: Brain: No evidence of acute infarction, hemorrhage, cerebral edema,
mass, mass effect, or midline shift. Ventricles and sulci are normal
for age. No extra-axial fluid collection. Small low-density foci in
the bilateral basal ganglia, likely lacunar infarcts, which are
favored to be unchanged from the prior exam.

Vascular: No hyperdense vessel or unexpected calcification.

Skull: Normal. Negative for fracture or focal lesion.

Sinuses/Orbits: No acute finding.

Other: The mastoid air cells are well aerated.

ASPECTS (Alberta Stroke Program Early CT Score)

- Ganglionic level infarction (caudate, lentiform nuclei, internal
capsule, insula, M1-M3 cortex): 7

- Supraganglionic infarction (M4-M6 cortex): 3

Total score (0-10 with 10 being normal): 10
IMPRESSION: 1. No acute intracranial process. Small low-density foci in the
bilateral basal ganglia, likely lacunar infarcts, which are favored
to be unchanged from the prior exam
2. ASPECTS is 10

Code stroke imaging results were communicated on [DATE] at [DATE] to provider Dr. DIELLZA via secure text paging.

## 2021-06-26 IMAGING — DX DG CHEST 1V PORT
1 series · 1 of 1 positions shown · non-contrast
Comparison: None.

CLINICAL DATA: Dizziness with altered mental status.

EXAM:
PORTABLE CHEST 1 VIEW

[chest ap]
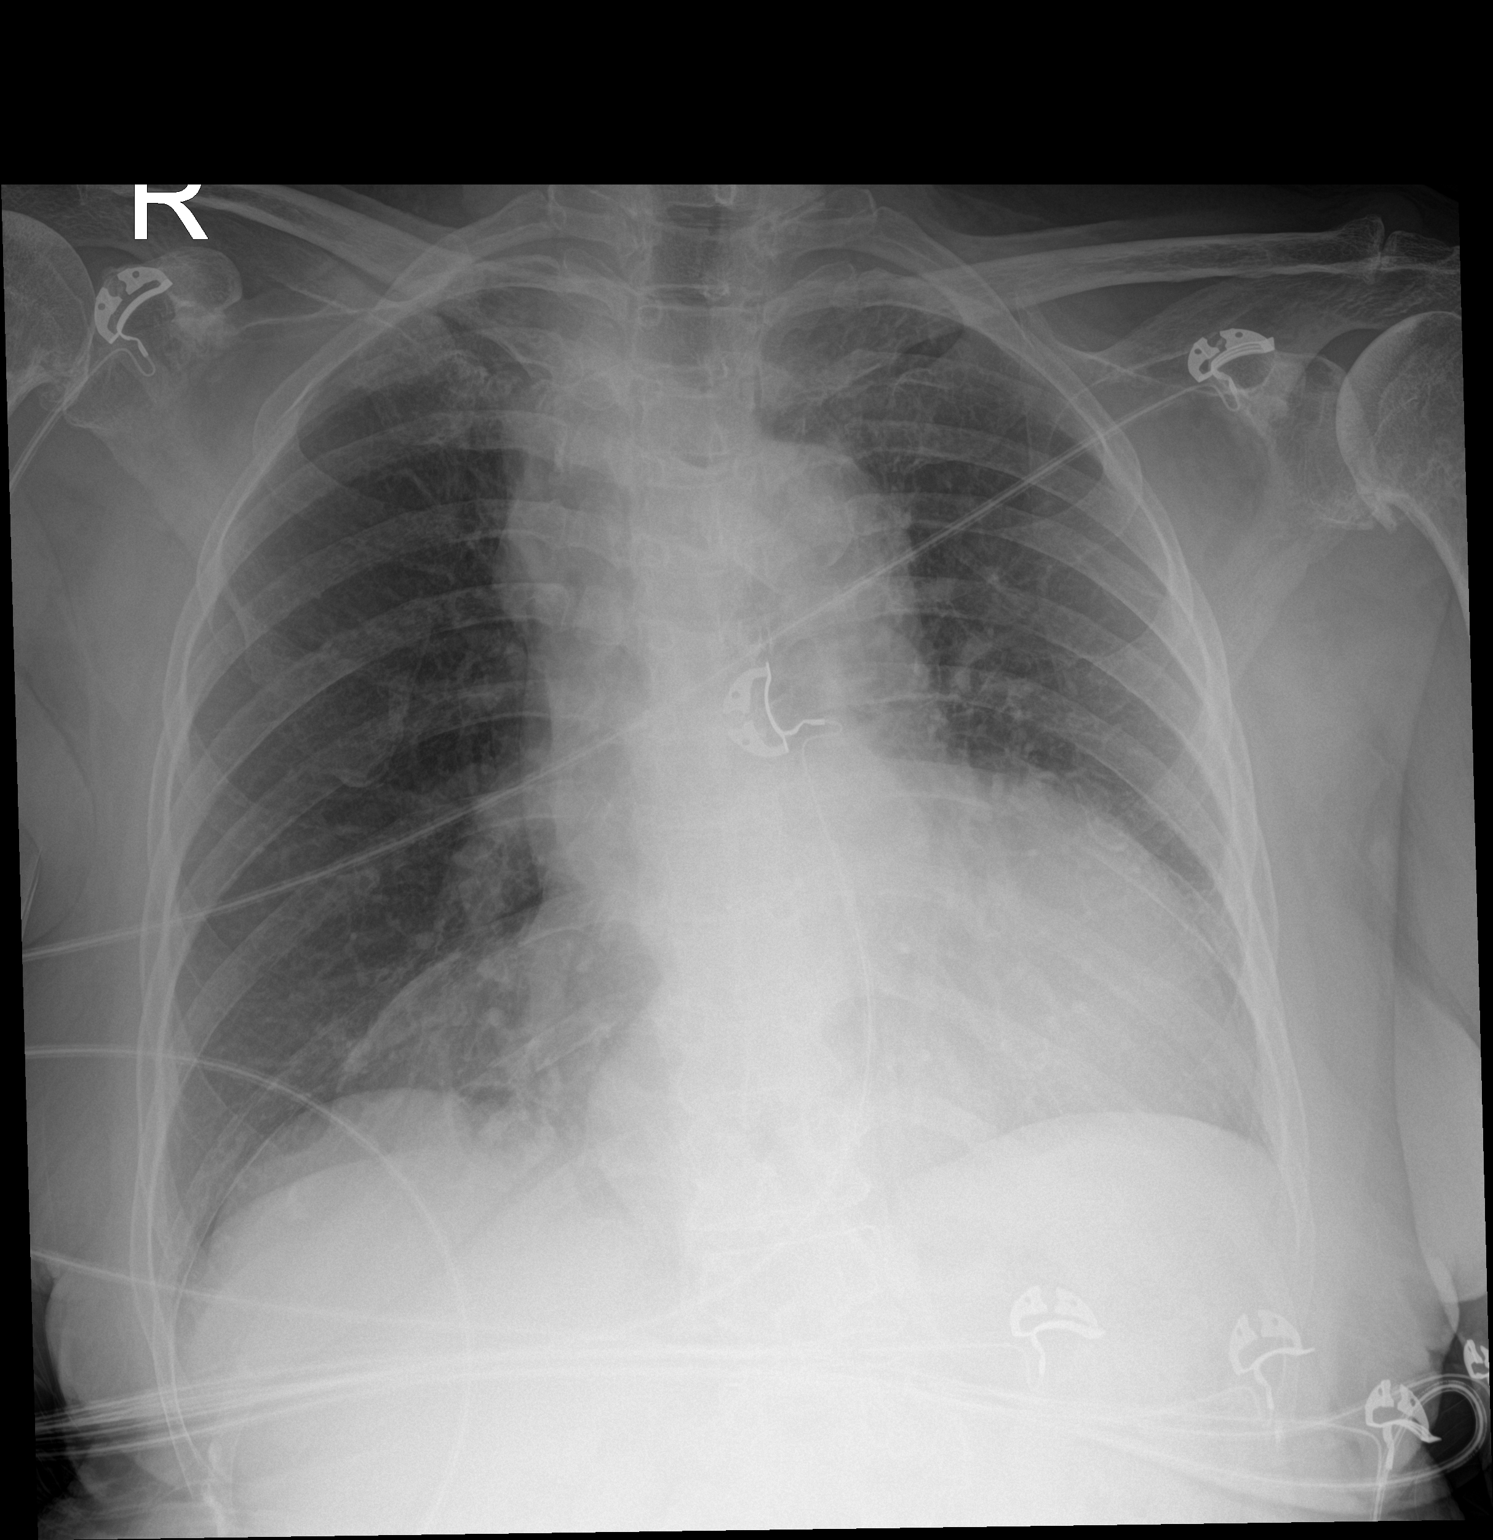

[1 of 1 positions shown; findings below may reference images not displayed]

FINDINGS: There is no evidence of acute infiltrate, pleural effusion or
pneumothorax. The cardiac silhouette is mildly enlarged. There is
marked severity tortuosity of the descending thoracic aorta.
Degenerative changes are seen throughout the thoracic spine.
IMPRESSION: Mild cardiomegaly, without evidence of acute cardiopulmonary
disease.

## 2021-06-26 MED ORDER — LABETALOL HCL 5 MG/ML IV SOLN
INTRAVENOUS | Status: AC
  Filled 2021-06-26: qty 4

## 2021-06-26 MED ORDER — DIPHENHYDRAMINE HCL 50 MG/ML IJ SOLN
INTRAMUSCULAR | Status: AC
  Filled 2021-06-26: qty 1

## 2021-06-26 MED ORDER — METOCLOPRAMIDE HCL 5 MG/ML IJ SOLN
5.0000 mg | Freq: Once | INTRAMUSCULAR | Status: AC
  Administered 2021-06-26: 5 mg via INTRAVENOUS
  Filled 2021-06-26: qty 2

## 2021-06-26 MED ORDER — DIPHENHYDRAMINE HCL 50 MG/ML IJ SOLN
12.5000 mg | Freq: Once | INTRAMUSCULAR | Status: AC
  Administered 2021-06-26: 12.5 mg via INTRAVENOUS

## 2021-06-26 MED ORDER — CLOPIDOGREL BISULFATE 300 MG PO TABS
300.0000 mg | ORAL_TABLET | Freq: Once | ORAL | Status: AC
  Administered 2021-06-26: 300 mg via ORAL
  Filled 2021-06-26: qty 1

## 2021-06-26 MED ORDER — ONDANSETRON HCL 4 MG/2ML IJ SOLN
4.0000 mg | Freq: Once | INTRAMUSCULAR | Status: AC
  Administered 2021-06-26: 4 mg via INTRAVENOUS
  Filled 2021-06-26: qty 2

## 2021-06-26 MED ORDER — TRAMADOL HCL 50 MG PO TABS
50.0000 mg | ORAL_TABLET | Freq: Once | ORAL | Status: AC
  Administered 2021-06-26: 50 mg via ORAL
  Filled 2021-06-26: qty 1

## 2021-06-26 MED ORDER — SODIUM CHLORIDE 0.9 % IV BOLUS
1000.0000 mL | Freq: Once | INTRAVENOUS | Status: AC
  Administered 2021-06-26: 1000 mL via INTRAVENOUS

## 2021-06-26 MED ORDER — DIPHENHYDRAMINE HCL 50 MG/ML IJ SOLN
12.5000 mg | Freq: Once | INTRAMUSCULAR | Status: AC
  Administered 2021-06-26: 12.5 mg via INTRAVENOUS
  Filled 2021-06-26: qty 1

## 2021-06-26 MED ORDER — ASPIRIN 81 MG PO CHEW
324.0000 mg | CHEWABLE_TABLET | Freq: Once | ORAL | Status: AC
  Administered 2021-06-26: 324 mg via ORAL
  Filled 2021-06-26: qty 4

## 2021-06-26 MED ORDER — IOHEXOL 350 MG/ML SOLN
100.0000 mL | Freq: Once | INTRAVENOUS | Status: AC | PRN
  Administered 2021-06-26: 100 mL via INTRAVENOUS

## 2021-06-26 NOTE — ED Triage Notes (Signed)
Pt POV with daughter in law-  Daughter reports leaving for work appx 1300 today pt was at baseline, mobile, alert. Returned home from work pt appeared dizzy, soiled herself, new confusion.   Pt tachypneic at triage, RT called to bedside.

## 2021-06-26 NOTE — ED Provider Notes (Signed)
[facility]ENCY DEPARTMENT Provider Note   CSN: [id number] Arrival date & time: 06/26/21  2023  An emergency department physician performed an initial assessment on this suspected stroke patient at 2049.  History Chief Complaint  Patient presents with   Altered Mental Status    Kim [name] is a 75 y.o. female.  75 yo F with a chief complaints of acute onset dizziness nausea and vomiting.  Per the family the patient was at her normal state of health this evening.  They had sat with her and had a meal.  Left about 630 and she was normal and then when they came back at 7 she was dizzy and vomiting.  Brought to the ED for evaluation.  Denies chest pain or pressure.  Denies abdominal discomfort.  No fevers cough or congestion.  The history is provided by the patient.  Altered Mental Status Associated symptoms: nausea and vomiting   Associated symptoms: no fever, no headaches and no palpitations   Illness Severity:  Moderate Onset quality:  Sudden Duration:  2 hours Timing:  Constant Progression:  Worsening Chronicity:  New Associated symptoms: nausea and vomiting   Associated symptoms: no chest pain, no congestion, no fever, no headaches, no myalgias, no rhinorrhea, no shortness of breath and no wheezing       Past Medical History:  Diagnosis Date   Arthritis    Depression    Hyperlipidemia    Hypertension     There are no problems to display for this patient.   History reviewed. No pertinent surgical history.   OB History   No obstetric history on file.     History reviewed. No pertinent family history.  Social History   Tobacco Use   Smoking status: Never   Smokeless tobacco: Never  Vaping Use   Vaping Use: Never used  Substance Use Topics   Alcohol use: No   Drug use: No    Home Medications Prior to Admission medications   Medication Sig Start Date End Date Taking? Authorizing Provider  acetaminophen (TYLENOL) 500 MG tablet Take 2  tablets (1,000 mg total) by mouth every 6 (six) hours as needed. 01/01/18   Charlesetta Shanks, MD  alendronate (FOSAMAX) 70 MG tablet TAKE 1 TABLET(70 MG) BY MOUTH 1 TIME A WEEK 03/03/20   [provider]  amLODipine (NORVASC) 5 MG tablet TAKE 1 TABLET(5 MG) BY MOUTH DAILY 05/14/15   [provider]  amoxicillin-clavulanate (AUGMENTIN) 875-125 MG tablet Take 1 tablet by mouth 2 (two) times daily. One po bid x 7 days 01/01/18   Charlesetta Shanks, MD  azaTHIOprine (IMURAN) 50 MG tablet Take by mouth. 03/27/20   [provider]  diclofenac Sodium (VOLTAREN) 1 % GEL Apply topically. 06/24/19   [provider]  hydrALAZINE (APRESOLINE) 25 MG tablet TAKE 1 TABLET (25MG ) BY MOUTH THREE TIMES DAILY. 03/20/20   [provider]  HYDROcodone-acetaminophen (NORCO/VICODIN) 5-325 MG per tablet Take 1 tablet by mouth every 6 (six) hours as needed for pain.    [provider]  HYDROcodone-acetaminophen (NORCO/VICODIN) 5-325 MG per tablet Take 1 tablet by mouth every 4 (four) hours as needed for pain. 10/26/12   Mylinda Latina, MD  LORazepam (ATIVAN) 1 MG tablet Take 1 tablet (1 mg total) by mouth at bedtime as needed for anxiety. 04/29/20   Horton, Barbette Hair, MD  PRAVASTATIN SODIUM PO Take by mouth daily.    [provider]  simvastatin (ZOCOR) 20 MG tablet TAKE 1 TABLET(20 MG) BY  MOUTH EVERY NIGHT 06/07/19   [provider]  traMADol (ULTRAM) 50 MG tablet Take by mouth. 01/08/20   [provider]  Valsartan (DIOVAN PO) Take by mouth daily.    [provider]    Allergies    Gabapentin, Labetalol, Lisinopril, and Nebivolol  Review of Systems   Review of Systems  Constitutional:  Negative for chills and fever.  HENT:  Negative for congestion and rhinorrhea.   Eyes:  Negative for redness and visual disturbance.  Respiratory:  Negative for shortness of breath and wheezing.   Cardiovascular:  Negative for chest pain and palpitations.   Gastrointestinal:  Positive for nausea and vomiting.  Genitourinary:  Negative for dysuria and urgency.  Musculoskeletal:  Negative for arthralgias and myalgias.  Skin:  Negative for pallor and wound.  Neurological:  Positive for dizziness. Negative for headaches.   Physical Exam Updated Vital Signs BP (!) 170/93    Pulse (!) 102    Temp 100.1 F (37.8 C) (Tympanic)    Resp (!) 28    Wt 52.2 kg    SpO2 94%    BMI 22.48 kg/m   Physical Exam Vitals and nursing note reviewed.  Constitutional:      General: She is not in acute distress.    Appearance: She is well-developed. She is not diaphoretic.  HENT:     Head: Normocephalic and atraumatic.  Eyes:     Pupils: Pupils are equal, round, and reactive to light.  Cardiovascular:     Rate and Rhythm: Normal rate and regular rhythm.     Heart sounds: No murmur heard.   No friction rub. No gallop.  Pulmonary:     Effort: Pulmonary effort is normal.     Breath sounds: No wheezing or rales.  Abdominal:     General: There is no distension.     Palpations: Abdomen is soft.     Tenderness: There is no abdominal tenderness.  Musculoskeletal:        General: No tenderness.     Cervical back: Normal range of motion and neck supple.  Skin:    General: Skin is warm and dry.  Neurological:     Mental Status: She is alert and oriented to person, place, and time.     Comments: Past-pointing worse with the left upper extremity than the right.  No obvious focal weakness.  Symmetric palate elevation.  Psychiatric:        Behavior: Behavior normal.    ED Results / Procedures / Treatments   Labs (all labs ordered are listed, but only abnormal results are displayed) Labs Reviewed  CBC - Abnormal; Notable for the following components:      Result Value   RBC 3.67 (*)    Hemoglobin 11.8 (*)    HCT 33.9 (*)    All other components within normal limits  COMPREHENSIVE METABOLIC PANEL - Abnormal; Notable for the following components:   Sodium  130 (*)    Potassium 2.9 (*)    Chloride 97 (*)    CO2 18 (*)    Glucose, Bld 219 (*)    Total Protein 8.6 (*)    All other components within normal limits  URINALYSIS, ROUTINE W REFLEX MICROSCOPIC - Abnormal; Notable for the following components:   Glucose, UA 250 (*)    Hgb urine dipstick TRACE (*)    All other components within normal limits  URINALYSIS, MICROSCOPIC (REFLEX) - Abnormal; Notable for the following components:   Bacteria, UA  RARE (*)    All other components within normal limits  CBG MONITORING, ED - Abnormal; Notable for the following components:   Glucose-Capillary 211 (*)    All other components within normal limits  RESP PANEL BY RT-PCR (FLU A&B, COVID) ARPGX2  ETHANOL  PROTIME-INR  APTT  DIFFERENTIAL  RAPID URINE DRUG SCREEN, HOSP PERFORMED    EKG EKG Interpretation  Date/Time:  Saturday June 26 2021 21:03:14 EST Ventricular Rate:  65 PR Interval:  202 QRS Duration: 151 QT Interval:  507 QTC Calculation: 528 R Axis:   -69 Text Interpretation: Sinus rhythm Left bundle branch block No significant change since last tracing Confirmed by Deno Etienne [phone]) on 06/26/2021 9:07:57 PM  Radiology CT HEAD CODE STROKE WO CONTRAST  Addendum Date: 06/26/2021   ADDENDUM REPORT: 06/26/2021 21:22 ADDENDUM: Code stroke imaging results were communicated on 06/26/2021 at 9:22 pm to provider Deno Etienne via telephone, who verbally acknowledged these results. Electronically Signed   By: Merilyn Baba M.D.   On: 06/26/2021 21:22   Result Date: 06/26/2021 CLINICAL DATA:  Code stroke.  Dizziness, confusion EXAM: CT HEAD WITHOUT CONTRAST TECHNIQUE: Contiguous axial images were obtained from the base of the skull through the vertex without intravenous contrast. COMPARISON:  04/29/2020 FINDINGS: Brain: No evidence of acute infarction, hemorrhage, cerebral edema, mass, mass effect, or midline shift. Ventricles and sulci are normal for age. No extra-axial fluid collection. Small  low-density foci in the bilateral basal ganglia, likely lacunar infarcts, which are favored to be unchanged from the prior exam. Vascular: No hyperdense vessel or unexpected calcification. Skull: Normal. Negative for fracture or focal lesion. Sinuses/Orbits: No acute finding. Other: The mastoid air cells are well aerated. ASPECTS [facility] Stroke Program Early CT Score) - Ganglionic level infarction (caudate, lentiform nuclei, internal capsule, insula, M1-M3 cortex): 7 - Supraganglionic infarction (M4-M6 cortex): 3 Total score (0-10 with 10 being normal): 10 IMPRESSION: 1. No acute intracranial process. Small low-density foci in the bilateral basal ganglia, likely lacunar infarcts, which are favored to be unchanged from the prior exam 2. ASPECTS is 10 Code stroke imaging results were communicated on 06/26/2021 at 9:14 pm to provider Dr. Lorrin Goodell via secure text paging. Electronically Signed: By: Merilyn Baba M.D. On: 06/26/2021 21:15    Procedures Procedures   Medications Ordered in ED Medications  labetalol (NORMODYNE) 5 MG/ML injection (0 mg  Hold 06/26/21 2109)  traMADol (ULTRAM) tablet 50 mg (has no administration in time range)  sodium chloride 0.9 % bolus 1,000 mL (1,000 mLs Intravenous New Bag/Given 06/26/21 2106)  ondansetron (ZOFRAN) injection 4 mg (4 mg Intravenous Given 06/26/21 2109)  aspirin chewable tablet 324 mg (324 mg Oral Given 06/26/21 2216)  clopidogrel (PLAVIX) tablet 300 mg (300 mg Oral Given 06/26/21 2216)  iohexol (OMNIPAQUE) 350 MG/ML injection 100 mL (100 mLs Intravenous Contrast Given 06/26/21 2139)  metoCLOPramide (REGLAN) injection 5 mg (5 mg Intravenous Given 06/26/21 2149)  diphenhydrAMINE (BENADRYL) injection 12.5 mg (12.5 mg Intravenous Given 06/26/21 2148)  diphenhydrAMINE (BENADRYL) injection 12.5 mg ( Intravenous Not Given 06/26/21 2230)    ED Course  I have reviewed the triage vital signs and the nursing notes.  Pertinent labs & imaging results that were  available during my care of the patient were reviewed by me and considered in my medical decision making (see chart for details).    MDM Rules/Calculators/A&P  75 yo F with a chief complaints of sudden onset dizziness nausea and vomiting.  Presentation is concerning for acute posterior circulation stroke.  Made the patient to code stroke upon my exam.  Patient examined by teleneurology.  Felt and last known normal was more consistent with 330 making her outside the window for tPA.  Plan to obtain a CT angiogram of the head and neck and if no LVO likely will have the patient admitted.  No LVO seen on CT angiogram.  Will discuss with medicine.  CRITICAL CARE Performed by: Cecilio Asper   Total critical care time: 35 minutes  Critical care time was exclusive of separately billable procedures and treating other patients.  Critical care was necessary to treat or prevent imminent or life-threatening deterioration.  Critical care was time spent personally by me on the following activities: development of treatment plan with patient and/or surrogate as well as nursing, discussions with consultants, evaluation of patient's response to treatment, examination of patient, obtaining history from patient or surrogate, ordering and performing treatments and interventions, ordering and review of laboratory studies, ordering and review of radiographic studies, pulse oximetry and re-evaluation of patient's condition.  The patients results and plan were reviewed and discussed.   Any x-rays performed were independently reviewed by myself.   Differential diagnosis were considered with the presenting HPI.  Medications  labetalol (NORMODYNE) 5 MG/ML injection (0 mg  Hold 06/26/21 2109)  traMADol (ULTRAM) tablet 50 mg (has no administration in time range)  sodium chloride 0.9 % bolus 1,000 mL (1,000 mLs Intravenous New Bag/Given 06/26/21 2106)  ondansetron (ZOFRAN) injection 4 mg  (4 mg Intravenous Given 06/26/21 2109)  aspirin chewable tablet 324 mg (324 mg Oral Given 06/26/21 2216)  clopidogrel (PLAVIX) tablet 300 mg (300 mg Oral Given 06/26/21 2216)  iohexol (OMNIPAQUE) 350 MG/ML injection 100 mL (100 mLs Intravenous Contrast Given 06/26/21 2139)  metoCLOPramide (REGLAN) injection 5 mg (5 mg Intravenous Given 06/26/21 2149)  diphenhydrAMINE (BENADRYL) injection 12.5 mg (12.5 mg Intravenous Given 06/26/21 2148)  diphenhydrAMINE (BENADRYL) injection 12.5 mg ( Intravenous Not Given 06/26/21 2230)    Vitals:   06/26/21 2138 06/26/21 2145 06/26/21 2215 06/26/21 2230  BP: (!) 152/88 (!) 168/74 (!) 169/106 (!) 170/93  Pulse: 83 79 (!) 102   Resp: (!) 27 19 16  (!) 28  Temp:      TempSrc:      SpO2: 100% 100% 94%   Weight:        Final diagnoses:  Dizziness  Acute focal neurological deficit    Admission/ observation were discussed with the admitting physician, patient and/or family and they are comfortable with the plan.      Final Clinical Impression(s) / ED Diagnoses Final diagnoses:  Dizziness  Acute focal neurological deficit    Rx / DC Orders ED Discharge Orders     None        Deno Etienne, DO 06/26/21 2254

## 2021-06-26 NOTE — Consult Note (Addendum)
CTA H&N  no LVO upon my review of images. F/u final radiology read.  [facility] Tele[facility]urology Consult Services   Patient Name:   Kim [name], Kim [name] Date of Birth:   [birth year] Identification Number:   MRN - [id number] Date of Service:   06/26/2021 20:58:55  Diagnosis:       I63.9 - Cerebrovascular accident (CVA), unspecified mechanism (HCC)  Impression:      80F who presents with dizziness, found to have a possible L field cut and facial weaknes and ataxia. Concern for acute CVA but out of thrombolytic window. Rec CTA H&N to assess for LVO and load with ASA 325 mg and plavix 300 mg and admit for full CVA workup if no LVO. Rec toxic metabolic infectious workup (U/A etc).  Metrics: Last Known Well: 06/26/2021 15:30:00 [facility] Notification Time: 06/26/2021 20:58:55 Arrival Time: 06/26/2021 20:23:00 Stamp Time: 06/26/2021 20:58:55 Initial Response Time: 06/26/2021 21:03:28 Symptoms: dizziness. NIHSS Start Assessment Time: 06/26/2021 21:04:20 Patient is not a candidate for Thrombolytic. Thrombolytic Medical Decision: 06/26/2021 21:07:00 Patient was not deemed candidate for Thrombolytic because of following reasons: Last Well Known Above 4.5 Hours.  I personally Reviewed the CT Head and it Showed no acute pathology  ED Physician notified of diagnostic impression and management plan on 06/26/2021 21:25:40  Advanced Imaging: CTA Head and Neck Ordered:   Our recommendations are outlined below.  Recommendations:        Stroke/Telemetry Floor       Neuro Checks       Bedside Swallow Eval       DVT Prophylaxis       IV Fluids, Normal Saline       Head of Bed 30 Degrees       Euglycemia and Avoid Hyperthermia (PRN Acetaminophen)       Bolus with Clopidogrel 300 mg bolus x1 and initiate dual antiplatelet therapy with Aspirin 81 mg daily and Clopidogrel 75 mg daily       Antihypertensives PRN if Blood pressure is greater than 220/120 or there is a concern  for End organ damage/contraindications for permissive HTN. If blood pressure is greater than 220/120 give labetalol PO or IV or Vasotec IV with a goal of 15% reduction in BP during the first 24 hours.  Routine Consultation with Inhouse Neurology for Follow up Care  Sign Out:       Discussed with Emergency Department Provider    ------------------------------------------------------------------------------  History of Present Illness: Patient is a 75 year old Female.  Patient was brought by private transportation with symptoms of dizziness.  45M pmhsf possible memory impairment here with dizziness. Incontinences, unable to bare weight in her legs, and weakness in her arms, speech changes. Was up getting a drink and then came and at down. Had emesis 2-3 times at home. LKN: 3:30 pm.  Has had this before with "low sodium" (Apr 29, 2020). Doesn't use a cane or walker.   Past Medical History:      There is no history of Coronary Artery Disease      There is no history of Stroke  Social History:Unable to obtain due to Patient Status  Family History:Unable to obtain due to Patient Status  Review of System:  14 Points Review of Systems was performed and was negative except mentioned in HPI.  No Anticoagulant use   No Antiplatelet use  Allergies:  Reviewed    Examination: BP(168/93), Pulse(68), Blood Glucose(211) 1A: Level of Consciousness - Alert; keenly responsive + 0 1B: Ask Month and  Age - 1 Question Right + 1 1C: Blink Eyes & Squeeze Hands - Performs Both Tasks + 0 2: Test Horizontal Extraocular Movements - Normal + 0 3: Test Visual Fields - Complete Hemianopia + 2 4: Test Facial Palsy (Use Grimace if Obtunded) - Minor paralysis (flat nasolabial fold, smile asymmetry) + 1 5A: Test Left Arm Motor Drift - Drift, but doesn't hit bed + 1 5B: Test Right Arm Motor Drift - Drift, but doesn't hit bed + 1 6A: Test Left Leg Motor Drift - No Drift for 5 Seconds + 0 6B: Test Right  Leg Motor Drift - No Drift for 5 Seconds + 0 7: Test Limb Ataxia (FNF/Heel-Shin) - Ataxia in 2 Limbs + 2 8: Test Sensation - Normal; No sensory loss + 0 9: Test Language/Aphasia - Normal; No aphasia + 0 10: Test Dysarthria - Mild-Moderate Dysarthria: Slurring but can be understood + 1 11: Test Extinction/Inattention - No abnormality + 0  NIHSS Score: 9  NIHSS Free Text : left face weakness, LUE and LE ataxia  Pre-Morbid Modified Rankin Scale: 1 Points = No significant disability despite symptoms; able to carry out all usual duties and activities   Patient/Family was informed the Neurology Consult would occur via TeleHealth consult by way of interactive audio and video telecommunications and consented to receiving care in this manner.   Patient is being evaluated for possible acute neurologic impairment and high probability of imminent or life-threatening deterioration. I spent total of 27 minutes providing care to this patient, including time for face to face visit via telemedicine, review of medical records, imaging studies and discussion of findings with providers, the patient and/or family.   Dr Amedeo Kinsman   [facility] [phone]  Case [id number]

## 2021-06-26 NOTE — ED Notes (Signed)
Pt transported with primary RN to CT.

## 2021-06-26 NOTE — Plan of Care (Signed)
Hospitalist transfer note: [facility] transfer: 75 yo F, normal at 630, acutely dizzy, nausea, vomiting, at 0700.  Tele neuro thinks stroke.  Will send to [facility], let neuro know when pt arrives.  No beds available right now so transfer may be delayed.  [facility] will assume care on arrival to accepting facility. Until arrival, care as per EDP. However, [facility] available 24/7 for questions and assistance.  Nursing staff, please page [facility] Admits and Consults [phone]) as soon as the patient arrives the hospital.

## 2021-06-26 NOTE — ED Notes (Signed)
Discussed with agitation with dr Adela Lank. Informed him pt has restless leg syndrome and is currently agitated. Due to language barrier used daughter in law as interpreter. No confusion present. Orders for repeat IV benadryl ordered

## 2021-06-26 NOTE — ED Notes (Signed)
EDP Adela Lank made aware of pt status, is at bedside.

## 2021-06-26 NOTE — ED Notes (Signed)
Pt's agitation has not improved dose for tramadol ordered. EDP at bedside

## 2021-06-26 NOTE — ED Notes (Addendum)
[place], Multimedia programmer notified of Code stroke activation.

## 2021-06-26 NOTE — ED Notes (Signed)
Pt's nausea/vomitting has returned following intake of oral fluids for stroke swallow screen. Dr Adela Lank informed and requested additional dose of nausea medicine. Unable to give ASA and plavix at this time

## 2021-06-26 NOTE — ED Notes (Addendum)
Tele Neuro assessment completed with assistance of daughter-in-law at bedside. Per Tele Neuro MD calculated NIH 9.

## 2021-06-26 NOTE — ED Notes (Signed)
Arrival to CT for CTA scans

## 2021-06-27 ENCOUNTER — Encounter (HOSPITAL_COMMUNITY): Payer: Self-pay | Admitting: Internal Medicine

## 2021-06-27 DIAGNOSIS — E876 Hypokalemia: Secondary | ICD-10-CM | POA: Diagnosis not present

## 2021-06-27 DIAGNOSIS — E782 Mixed hyperlipidemia: Secondary | ICD-10-CM

## 2021-06-27 DIAGNOSIS — Z79899 Other long term (current) drug therapy: Secondary | ICD-10-CM | POA: Diagnosis not present

## 2021-06-27 DIAGNOSIS — E871 Hypo-osmolality and hyponatremia: Secondary | ICD-10-CM

## 2021-06-27 DIAGNOSIS — I63542 Cerebral infarction due to unspecified occlusion or stenosis of left cerebellar artery: Secondary | ICD-10-CM | POA: Diagnosis not present

## 2021-06-27 DIAGNOSIS — Z888 Allergy status to other drugs, medicaments and biological substances status: Secondary | ICD-10-CM | POA: Diagnosis not present

## 2021-06-27 DIAGNOSIS — G2581 Restless legs syndrome: Secondary | ICD-10-CM

## 2021-06-27 DIAGNOSIS — Z20822 Contact with and (suspected) exposure to covid-19: Secondary | ICD-10-CM | POA: Diagnosis not present

## 2021-06-27 DIAGNOSIS — M810 Age-related osteoporosis without current pathological fracture: Secondary | ICD-10-CM | POA: Diagnosis not present

## 2021-06-27 DIAGNOSIS — M329 Systemic lupus erythematosus, unspecified: Secondary | ICD-10-CM | POA: Diagnosis not present

## 2021-06-27 DIAGNOSIS — R739 Hyperglycemia, unspecified: Secondary | ICD-10-CM | POA: Diagnosis present

## 2021-06-27 DIAGNOSIS — R29709 NIHSS score 9: Secondary | ICD-10-CM | POA: Diagnosis not present

## 2021-06-27 DIAGNOSIS — I10 Essential (primary) hypertension: Secondary | ICD-10-CM

## 2021-06-27 DIAGNOSIS — R9389 Abnormal findings on diagnostic imaging of other specified body structures: Secondary | ICD-10-CM | POA: Diagnosis not present

## 2021-06-27 DIAGNOSIS — M1990 Unspecified osteoarthritis, unspecified site: Secondary | ICD-10-CM | POA: Diagnosis not present

## 2021-06-27 DIAGNOSIS — R42 Dizziness and giddiness: Secondary | ICD-10-CM

## 2021-06-27 DIAGNOSIS — Z7983 Long term (current) use of bisphosphonates: Secondary | ICD-10-CM | POA: Diagnosis not present

## 2021-06-27 DIAGNOSIS — I429 Cardiomyopathy, unspecified: Secondary | ICD-10-CM | POA: Diagnosis not present

## 2021-06-27 DIAGNOSIS — R4182 Altered mental status, unspecified: Secondary | ICD-10-CM | POA: Diagnosis present

## 2021-06-27 MED ORDER — HYDRALAZINE HCL 25 MG PO TABS
50.0000 mg | ORAL_TABLET | Freq: Once | ORAL | Status: AC
  Administered 2021-06-27: 21:00:00 50 mg via ORAL
  Filled 2021-06-27: qty 2

## 2021-06-27 MED ORDER — ACETAMINOPHEN 650 MG RE SUPP: 650.0000 mg | Freq: Four times a day (QID) | RECTAL | Status: DC | PRN

## 2021-06-27 MED ORDER — LORAZEPAM 2 MG/ML IJ SOLN
1.0000 mg | Freq: Once | INTRAMUSCULAR | Status: AC
  Administered 2021-06-27: 01:00:00 1 mg via INTRAVENOUS
  Filled 2021-06-27: qty 1

## 2021-06-27 MED ORDER — ROSUVASTATIN CALCIUM 20 MG PO TABS
20.0000 mg | ORAL_TABLET | Freq: Every day | ORAL | Status: DC
  Administered 2021-06-28 – 2021-06-30 (×3): 20 mg via ORAL
  Filled 2021-06-27 (×3): qty 1

## 2021-06-27 MED ORDER — STROKE: EARLY STAGES OF RECOVERY BOOK
Freq: Once | Status: AC
  Filled 2021-06-27: qty 1

## 2021-06-27 MED ORDER — AMLODIPINE BESYLATE 5 MG PO TABS
5.0000 mg | ORAL_TABLET | Freq: Once | ORAL | Status: AC
  Administered 2021-06-27: 21:00:00 5 mg via ORAL
  Filled 2021-06-27: qty 1

## 2021-06-27 MED ORDER — DULOXETINE HCL 20 MG PO CPEP
20.0000 mg | ORAL_CAPSULE | Freq: Every day | ORAL | Status: DC
  Administered 2021-06-28 – 2021-06-30 (×3): 20 mg via ORAL
  Filled 2021-06-27 (×3): qty 1

## 2021-06-27 MED ORDER — ROPINIROLE HCL 1 MG PO TABS
1.0000 mg | ORAL_TABLET | Freq: Every day | ORAL | Status: DC
  Administered 2021-06-28 – 2021-06-29 (×3): 1 mg via ORAL
  Filled 2021-06-27 (×3): qty 1

## 2021-06-27 MED ORDER — ONDANSETRON HCL 4 MG PO TABS: 4.0000 mg | ORAL_TABLET | Freq: Four times a day (QID) | ORAL | Status: DC | PRN

## 2021-06-27 MED ORDER — HYDRALAZINE HCL 20 MG/ML IJ SOLN: 10.0000 mg | Freq: Four times a day (QID) | INTRAMUSCULAR | Status: DC | PRN

## 2021-06-27 MED ORDER — ENOXAPARIN SODIUM 30 MG/0.3ML IJ SOSY
30.0000 mg | PREFILLED_SYRINGE | INTRAMUSCULAR | Status: DC
  Administered 2021-06-28: 11:00:00 30 mg via SUBCUTANEOUS
  Filled 2021-06-27: qty 0.3

## 2021-06-27 MED ORDER — HYDROXYCHLOROQUINE SULFATE 200 MG PO TABS
200.0000 mg | ORAL_TABLET | Freq: Every day | ORAL | Status: DC
  Administered 2021-06-28 – 2021-06-30 (×3): 200 mg via ORAL
  Filled 2021-06-27 (×3): qty 1

## 2021-06-27 MED ORDER — AZATHIOPRINE 50 MG PO TABS
50.0000 mg | ORAL_TABLET | Freq: Every day | ORAL | Status: DC
  Administered 2021-06-28 – 2021-06-30 (×3): 50 mg via ORAL
  Filled 2021-06-27 (×3): qty 1

## 2021-06-27 MED ORDER — POLYETHYLENE GLYCOL 3350 17 G PO PACK
17.0000 g | PACK | Freq: Every day | ORAL | Status: DC | PRN
  Administered 2021-06-30: 09:00:00 17 g via ORAL
  Filled 2021-06-27: qty 1

## 2021-06-27 MED ORDER — ACETAMINOPHEN 325 MG PO TABS: 650.0000 mg | ORAL_TABLET | Freq: Four times a day (QID) | ORAL | Status: DC | PRN

## 2021-06-27 MED ORDER — LIP MEDEX EX OINT
TOPICAL_OINTMENT | CUTANEOUS | Status: DC | PRN
  Filled 2021-06-27: qty 7

## 2021-06-27 MED ORDER — ONDANSETRON HCL 4 MG/2ML IJ SOLN: 4.0000 mg | Freq: Four times a day (QID) | INTRAMUSCULAR | Status: DC | PRN

## 2021-06-27 MED ORDER — HYDRALAZINE HCL 20 MG/ML IJ SOLN
10.0000 mg | Freq: Once | INTRAMUSCULAR | Status: AC
  Administered 2021-06-27: 20:00:00 10 mg via INTRAVENOUS
  Filled 2021-06-27: qty 1

## 2021-06-27 MED ORDER — TRAMADOL HCL 50 MG PO TABS
50.0000 mg | ORAL_TABLET | Freq: Two times a day (BID) | ORAL | Status: DC
  Administered 2021-06-28 – 2021-06-30 (×6): 50 mg via ORAL
  Filled 2021-06-27 (×6): qty 1

## 2021-06-27 MED ORDER — INSULIN ASPART 100 UNIT/ML IJ SOLN: 0.0000 [IU] | Freq: Four times a day (QID) | INTRAMUSCULAR | Status: DC

## 2021-06-27 NOTE — ED Notes (Addendum)
Pt sleeping upon entry to room, awakened and pt is A & O x 3.  Equal strength in all extremities, no facial droop.  Vss, pt is bradycardic while sleeping.   Attempted to call report and RN is still receiving report

## 2021-06-27 NOTE — ED Notes (Signed)
Dr. Anitra Lauth made aware of pt's increased BP orders received

## 2021-06-27 NOTE — ED Notes (Signed)
Pt's agitation has improved following ativan 1 mg IV. Pt is resting comfortably. Daughter in law remains at bedside.

## 2021-06-27 NOTE — ED Notes (Signed)
Meal provided to patient

## 2021-06-27 NOTE — ED Notes (Signed)
1 mg ativan given for pt's agitation.

## 2021-06-27 NOTE — H&P (Addendum)
History and Physical    Kim [name] VHQ:[id number] DOB: 1945-08-18 DOA: 06/26/2021  PCP: Judd Lien, PA-C  Patient coming from: Home via [facility]   Chief Complaint:  Chief Complaint  Patient presents with   Altered Mental Status     HPI:    75 year old Punjabi-speaking female with past medical history of SLE (follows with [facility] rheumatology), hypertension, hyperlipidemia, gastroesophageal reflux disease, restless leg syndrome and osteoporosis who presented to med [facility] emergency department with her daughter without sudden onset of confusion and dizziness.  According the patient's daughter patient's last known normal was at approximately 3:30 PM on 12/17.  When the patient's daughter had arrived home at approximately 6 PM on 12/17 she noticed that her mother was quite dizzy.  This was associated with intense nausea and vomiting.  Daughter also reports that the patient was visibly lethargic and not quite acting at baseline.  Patient's daughter then brought the patient into med [facility] emergency department for evaluation.  Upon evaluation in the emergency department due to ongoing complaints of dizziness there was clinical concern for posterior circulation stroke.  A code stroke was called and patient was evaluated via teleneurology.  Patient was felt to be outside of the tPA window and therefore this was not pursued.  Neurology recommended administration of aspirin 305 mg as well as Plavix 300 mg.  They additionally recommended hospitalization for continued stroke work-up as well as a more broad work-up for causes of encephalopathy.  The hospitalist group was then called and the patient was accepted for transfer to [facility] for continued care.  Review of Systems:   Review of Systems  Gastrointestinal:  Positive for vomiting.  Neurological:  Positive for dizziness.  All other systems reviewed and are negative.  Past Medical History:  Diagnosis Date   Arthritis     Depression    Essential hypertension 06/28/2021   Mixed hyperlipidemia 06/28/2021    History reviewed. No pertinent surgical history.   reports that she has never smoked. She has never used smokeless tobacco. She reports that she does not drink alcohol and does not use drugs.  Allergies  Allergen Reactions   Gabapentin Other (See Comments)    unknown unknown    Labetalol Other (See Comments)    Bradycardia   Lisinopril Other (See Comments)   Nebivolol Other (See Comments)    Bradycardia,    Family History  Problem Relation Age of Onset   Heart disease Neg Hx      Prior to Admission medications   Medication Sig Start Date End Date Taking? Authorizing Provider  amLODipine (NORVASC) 5 MG tablet Take 5 mg by mouth daily. 05/14/15  Yes [provider]  azaTHIOprine (IMURAN) 50 MG tablet Take 50 mg by mouth daily. 03/27/20  Yes [provider]  DULoxetine (CYMBALTA) 20 MG capsule Take 20 mg by mouth daily.   Yes [provider]  hydrALAZINE (APRESOLINE) 50 MG tablet Take 50 mg by mouth in the morning and at bedtime.   Yes [provider]  hydroxychloroquine (PLAQUENIL) 200 MG tablet Take 200 mg by mouth daily.   Yes [provider]  rOPINIRole (REQUIP) 1 MG tablet Take 1 mg by mouth at bedtime.   Yes [provider]  rosuvastatin (CRESTOR) 20 MG tablet Take 20 mg by mouth daily.   Yes [provider]  traMADol (ULTRAM) 50 MG tablet Take 50 mg by mouth 2 (two) times daily. 01/08/20  Yes [provider]  valsartan (DIOVAN) 320 MG tablet Take 320 mg by mouth daily.   Yes [provider]  Vitamin D, Ergocalciferol, (DRISDOL) 1.25 MG (50000 UNIT) CAPS capsule Take 50,000 Units by mouth every 7 (seven) days.   Yes [provider]  acetaminophen (TYLENOL) 500 MG tablet Take 2 tablets (1,000 mg total) by mouth every 6 (six) hours as needed. Patient not taking: Reported on 06/27/2021 01/01/18    Arby Barrette, MD  amoxicillin-clavulanate (AUGMENTIN) 875-125 MG tablet Take 1 tablet by mouth 2 (two) times daily. One po bid x 7 days Patient not taking: Reported on 06/27/2021 01/01/18   Arby Barrette, MD  HYDROcodone-acetaminophen (NORCO/VICODIN) 5-325 MG per tablet Take 1 tablet by mouth every 4 (four) hours as needed for pain. Patient not taking: Reported on 06/27/2021 10/26/12   Carleene Cooper, MD  LORazepam (ATIVAN) 1 MG tablet Take 1 tablet (1 mg total) by mouth at bedtime as needed for anxiety. Patient not taking: Reported on 06/27/2021 04/29/20   Shon Baton, MD    Physical Exam: Vitals:   06/27/21 2030 06/27/21 2216 06/28/21 0137 06/28/21 0253  BP: (!) 195/104 (!) 162/99 122/78 (!) 142/95  Pulse: 61 74  82  Resp: 16 20  18   Temp:  98.2 F (36.8 C)  98.4 F (36.9 C)  TempSrc:  Oral  Oral  SpO2: 99% 99%  98%  Weight:  53.5 kg    Height:  5' (1.524 m)      Constitutional: Awake alert , no associated distress.   Skin: no rashes, no lesions, poor skin turgor noted. Eyes: Pupils are equally reactive to light.  No evidence of scleral icterus or conjunctival pallor.  ENMT: Dry mucous membranes noted.  Posterior pharynx clear of any exudate or lesions.   Neck: normal, supple, no masses, no thyromegaly.  No evidence of jugular venous distension.   Respiratory: clear to auscultation bilaterally, no wheezing, no crackles. Normal respiratory effort. No accessory muscle use.  Cardiovascular: Regular rate and rhythm, no murmurs / rubs / gallops. No extremity edema. 2+ pedal pulses. No carotid bruits.  Chest:   Nontender without crepitus or deformity.   Back:   Nontender without crepitus or deformity. Abdomen: Abdomen is soft and nontender.  No evidence of intra-abdominal masses.  Positive bowel sounds noted in all quadrants.   Musculoskeletal: No joint deformity upper and lower extremities. Good ROM, no contractures. Normal muscle tone.  Neurologic: CN 2-12 grossly intact.  Sensation intact.  Patient moving all 4 extremities spontaneously.  Patient is following all commands.  Patient is responsive to verbal stimuli.   Psychiatric: Patient exhibits normal mood with flat affect.  Patient seems to possess insight as to their current situation.     Labs on Admission: I have personally reviewed following labs and imaging studies -   CBC: Recent Labs  Lab 06/26/21 2055 06/28/21 0032 06/28/21 0412  WBC 7.6 4.6 5.2  NEUTROABS 6.1 3.2 3.9  HGB 11.8* 13.1 11.6*  HCT 33.9* 37.9 34.5*  MCV 92.4 92.4 94.0  PLT 310 271 284   Basic Metabolic Panel: Recent Labs  Lab 06/26/21 2055 06/28/21 0032  NA 130* 132*  K 2.9* 4.8  CL 97* 101  CO2 18* 20*  GLUCOSE 219* 80  BUN 18 14  CREATININE 0.65 0.78  CALCIUM 9.5 9.2  MG  --  2.2   GFR: Estimated Creatinine Clearance: 43 mL/min (by C-G formula based on SCr of 0.78 mg/dL). Liver Function Tests: Recent Labs  Lab 06/26/21 2055  06/28/21 0032  AST 32 59*  ALT 21 25  ALKPHOS 70 73  BILITOT 0.5 1.8*  PROT 8.6* 7.8  ALBUMIN 4.5 3.9   No results for input(s): LIPASE, AMYLASE in the last 168 hours. No results for input(s): AMMONIA in the last 168 hours. Coagulation Profile: Recent Labs  Lab 06/26/21 2055  INR 1.0   Cardiac Enzymes: No results for input(s): CKTOTAL, CKMB, CKMBINDEX, TROPONINI in the last 168 hours. BNP (last 3 results) No results for input(s): PROBNP in the last 8760 hours. HbA1C: Recent Labs    06/28/21 0032  HGBA1C 5.9*   CBG: Recent Labs  Lab 06/26/21 2100 06/28/21 0033  GLUCAP 211* 83   Lipid Profile: Recent Labs    06/28/21 0032  CHOL 143  HDL 62  LDLCALC 73  TRIG 40  CHOLHDL 2.3   Thyroid Function Tests: No results for input(s): TSH, T4TOTAL, FREET4, T3FREE, THYROIDAB in the last 72 hours. Anemia Panel: No results for input(s): VITAMINB12, FOLATE, FERRITIN, TIBC, IRON, RETICCTPCT in the last 72 hours. Urine analysis:    Component Value Date/Time   COLORURINE  YELLOW 06/26/2021 2231   APPEARANCEUR CLEAR 06/26/2021 2231   LABSPEC 1.015 06/26/2021 2231   PHURINE 7.5 06/26/2021 2231   GLUCOSEU 250 (A) 06/26/2021 2231   HGBUR TRACE (A) 06/26/2021 2231   BILIRUBINUR NEGATIVE 06/26/2021 2231   KETONESUR NEGATIVE 06/26/2021 2231   PROTEINUR NEGATIVE 06/26/2021 2231   NITRITE NEGATIVE 06/26/2021 2231   LEUKOCYTESUR NEGATIVE 06/26/2021 2231    Radiological Exams on Admission - Personally Reviewed: CT Angio Head W or Wo Contrast  Result Date: 06/26/2021 CLINICAL DATA:  Neuro deficit, stroke suspected, dizziness, confusion EXAM: CT ANGIOGRAPHY HEAD AND NECK TECHNIQUE: Multidetector CT imaging of the head and neck was performed using the standard protocol during bolus administration of intravenous contrast. Multiplanar CT image reconstructions and MIPs were obtained to evaluate the vascular anatomy. Carotid stenosis measurements (when applicable) are obtained utilizing NASCET criteria, using the distal internal carotid diameter as the denominator. CONTRAST:  OMNIPAQUE IOHEXOL 350 MG/ML SOLN COMPARISON:  No prior CTA, correlation is made with CT head 06/26/2021 and 04/29/2020 FINDINGS: CT HEAD FINDINGS For noncontrast findings, please see same day CT head. CTA NECK FINDINGS Aortic arch: Standard branching. Imaged portion shows no evidence of aneurysm or dissection. No significant stenosis of the major arch vessel origins. Right carotid system: No evidence of dissection, stenosis (50% or greater) or occlusion. Calcified plaque at the bifurcation and in the proximal right ICA is not hemodynamically significant. Retropharyngeal course of the right ICA. Left carotid system: No evidence of dissection, stenosis (50% or greater) or occlusion. Calcifications at the bifurcation and in the proximal left ICA are not hemodynamically significant. 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 The bilateral extracranial vertebral arteries are patent, without significant stenosis, dissection, or occlusion. Skeleton: Straightening and mild reversal of the normal cervical lordosis. No acute osseous abnormality. Other neck: Negative. Upper chest: Apical pleural-parenchymal scarring. No focal pulmonary opacity or pleural effusion. Review of the MIP images confirms the above findings CTA HEAD FINDINGS Anterior circulation: Both internal carotid arteries are patent to the termini, with mild calcifications in the cavernous and supraclinoid segments that do not cause significant stenosis. Two mm posteriorly directed outpouching from the right ICA terminus (series 506, image 214), favored to represent an infundibulum from the origin of the right posterior communicating  artery, which is not otherwise visualized, possibly occluded. A1 segments patent. Normal anterior communicating artery. Anterior cerebral arteries are patent to their distal aspects. No M1 stenosis or occlusion. Normal MCA bifurcations. Focal moderate calcified stenosis in a left M2 branch (series 506, image 205). Distal MCA branches otherwise perfused and symmetric. Posterior circulation: Vertebral arteries are patent to the vertebrobasilar junction, with mild calcified stenosis of the right V4, distal to the PICA takeoff. Posterior inferior cerebral arteries patent bilaterally. Basilar patent to its distal aspect. Superior cerebellar arteries patent bilaterally. Mild narrowing of the right P1 (series 506, image 195). Moderate narrowing of the proximal left P2. Poor opacification of the more inferior left P3. The bilateral posterior communicating arteries are not definitively visualized. Venous sinuses: As permitted by contrast timing, patent. Anatomic variants: None significant Review of the MIP images confirms the above findings IMPRESSION: 1. Two mm outpouching from the right ICA terminus, which is favored to represent an  infundibulum from the origin of the right posterior communicating artery, which is not visualized, possibly occluded, versus a very small aneurysm. 2. Focal moderate calcified stenosis in a left M2 branch. 3. Moderate narrowing in the left P2, with poor opacification of a left P3 branch. 4. Incidental note is made of duplicated right V1 segments, which merge at the level of C5-C6. 5.  No hemodynamically significant stenosis in the neck. Electronically Signed   By: Wiliam Ke M.D.   On: 06/26/2021 22:41   CT Angio Neck W and/or Wo Contrast  Result Date: 06/26/2021 CLINICAL DATA:  Neuro deficit, stroke suspected, dizziness, confusion EXAM: CT ANGIOGRAPHY HEAD AND NECK TECHNIQUE: Multidetector CT imaging of the head and neck was performed using the standard protocol during bolus administration of intravenous contrast. Multiplanar CT image reconstructions and MIPs were obtained to evaluate the vascular anatomy. Carotid stenosis measurements (when applicable) are obtained utilizing NASCET criteria, using the distal internal carotid diameter as the denominator. CONTRAST:  OMNIPAQUE IOHEXOL 350 MG/ML SOLN COMPARISON:  No prior CTA, correlation is made with CT head 06/26/2021 and 04/29/2020 FINDINGS: CT HEAD FINDINGS For noncontrast findings, please see same day CT head. CTA NECK FINDINGS Aortic arch: Standard branching. Imaged portion shows no evidence of aneurysm or dissection. No significant stenosis of the major arch vessel origins. Right carotid system: No evidence of dissection, stenosis (50% or greater) or occlusion. Calcified plaque at the bifurcation and in the proximal right ICA is not hemodynamically significant. Retropharyngeal course of the right ICA. Left carotid system: No evidence of dissection, stenosis (50% or greater) or occlusion. Calcifications at the bifurcation and in the proximal left ICA are not hemodynamically significant. Vertebral arteries: Duplicated origin of the right vertebral  artery V1 segment which merge at the V2 segment at the level of C5-C6 (series 506, image 45). The bilateral extracranial vertebral arteries are patent, without significant stenosis, dissection, or occlusion. Skeleton: Straightening and mild reversal of the normal cervical lordosis. No acute osseous abnormality. Other neck: Negative. Upper chest: Apical pleural-parenchymal scarring. No focal pulmonary opacity or pleural effusion. Review of the MIP images confirms the above findings CTA HEAD FINDINGS Anterior circulation: Both internal carotid arteries are patent to the termini, with mild calcifications in the cavernous and supraclinoid segments that do not cause significant stenosis. Two mm posteriorly directed outpouching from the right ICA terminus (series 506, image 214), favored to represent an infundibulum from the origin of the right posterior communicating artery, which is not otherwise visualized, possibly occluded. A1 segments patent. Normal anterior communicating  artery. Anterior cerebral arteries are patent to their distal aspects. No M1 stenosis or occlusion. Normal MCA bifurcations. Focal moderate calcified stenosis in a left M2 branch (series 506, image 205). Distal MCA branches otherwise perfused and symmetric. Posterior circulation: Vertebral arteries are patent to the vertebrobasilar junction, with mild calcified stenosis of the right V4, distal to the PICA takeoff. Posterior inferior cerebral arteries patent bilaterally. Basilar patent to its distal aspect. Superior cerebellar arteries patent bilaterally. Mild narrowing of the right P1 (series 506, image 195). Moderate narrowing of the proximal left P2. Poor opacification of the more inferior left P3. The bilateral posterior communicating arteries are not definitively visualized. Venous sinuses: As permitted by contrast timing, patent. Anatomic variants: None significant Review of the MIP images confirms the above findings IMPRESSION: 1. Two mm  outpouching from the right ICA terminus, which is favored to represent an infundibulum from the origin of the right posterior communicating artery, which is not visualized, possibly occluded, versus a very small aneurysm. 2. Focal moderate calcified stenosis in a left M2 branch. 3. Moderate narrowing in the left P2, with poor opacification of a left P3 branch. 4. Incidental note is made of duplicated right V1 segments, which merge at the level of C5-C6. 5.  No hemodynamically significant stenosis in the neck. Electronically Signed   By: Wiliam Ke M.D.   On: 06/26/2021 22:41   MR BRAIN WO CONTRAST  Result Date: 06/28/2021 CLINICAL DATA:  Neuro deficit, stroke suspected EXAM: MRI HEAD WITHOUT CONTRAST TECHNIQUE: Multiplanar, multiecho pulse sequences of the brain and surrounding structures were obtained without intravenous contrast. COMPARISON:  No prior MRI, correlation is made with CT head 06/26/2021 FINDINGS: Brain: Restricted diffusion in the superomedial left cerebellum, which measures 2.4 x 2.7 x 1.3 cm (series 5, image 61 and series 7, image 45). This area is associated with increased T2 signal, likely mild edema, and hemosiderin deposition. This causes mild local mass effect without significant narrowing of the fourth ventricle. No midline shift. No hydrocephalus or extra-axial collection. T2 hyperintense signal in the periventricular white matter and pons, likely the sequela of moderate chronic small vessel ischemic disease. Lacunar infarct in the left greater than right basal ganglia and left corona radiata. Vascular: Normal flow voids. Skull and upper cervical spine: Normal marrow signal. Sinuses/Orbits: Negative. Other: None. IMPRESSION: Acute left SCA territory infarct, with associated with hemosiderin deposition, likely petechial hemorrhage, and mild edema, which causes mild local mass effect without significant narrowing of the fourth ventricle. These results were called by telephone at the  time of interpretation on 06/28/2021 at 3:50 am to provider [facility] , who verbally acknowledged these results. Electronically Signed   By: Wiliam Ke M.D.   On: 06/28/2021 03:56   DG Chest Port 1 View  Result Date: 06/26/2021 CLINICAL DATA:  Dizziness with altered mental status. EXAM: PORTABLE CHEST 1 VIEW COMPARISON:  None. FINDINGS: There is no evidence of acute infiltrate, pleural effusion or pneumothorax. The cardiac silhouette is mildly enlarged. There is marked severity tortuosity of the descending thoracic aorta. Degenerative changes are seen throughout the thoracic spine. IMPRESSION: Mild cardiomegaly, without evidence of acute cardiopulmonary disease. Electronically Signed   By: Aram Candela M.D.   On: 06/26/2021 23:24   CT HEAD CODE STROKE WO CONTRAST  Addendum Date: 06/26/2021   ADDENDUM REPORT: 06/26/2021 21:22 ADDENDUM: Code stroke imaging results were communicated on 06/26/2021 at 9:22 pm to provider Melene Plan via telephone, who verbally acknowledged these results. Electronically Signed   By:  Wiliam Ke M.D.   On: 06/26/2021 21:22   Result Date: 06/26/2021 CLINICAL DATA:  Code stroke.  Dizziness, confusion EXAM: CT HEAD WITHOUT CONTRAST TECHNIQUE: Contiguous axial images were obtained from the base of the skull through the vertex without intravenous contrast. COMPARISON:  04/29/2020 FINDINGS: Brain: No evidence of acute infarction, hemorrhage, cerebral edema, mass, mass effect, or midline shift. Ventricles and sulci are normal for age. No extra-axial fluid collection. Small low-density foci in the bilateral basal ganglia, likely lacunar infarcts, which are favored to be unchanged from the prior exam. Vascular: No hyperdense vessel or unexpected calcification. Skull: Normal. Negative for fracture or focal lesion. Sinuses/Orbits: No acute finding. Other: The mastoid air cells are well aerated. ASPECTS [facility] Stroke Program Early CT Score) - Ganglionic level infarction  (caudate, lentiform nuclei, internal capsule, insula, M1-M3 cortex): 7 - Supraganglionic infarction (M4-M6 cortex): 3 Total score (0-10 with 10 being normal): 10 IMPRESSION: 1. No acute intracranial process. Small low-density foci in the bilateral basal ganglia, likely lacunar infarcts, which are favored to be unchanged from the prior exam 2. ASPECTS is 10 Code stroke imaging results were communicated on 06/26/2021 at 9:14 pm to provider Dr. Derry Lory via secure text paging. Electronically Signed: By: Wiliam Ke M.D. On: 06/26/2021 21:15    EKG: Personally reviewed.  Rhythm is sinus bradycardia with heart rate of 48 bpm.  No dynamic ST segment changes appreciated.  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    MRI brain confirming right cerebellar stroke with petechial hemorrhage and some mass effect Case discussed with Dr. Vallarie Mare - stroke team to follow the patient today  Patient receiving ASA/Plavix for DAPT Performing serial neurologic checks Monitoring on telemetry Echocardiogram ordered for the morning  Serial neurologic checks PT, OT evaluations ordered SLP evaluation ordered As needed antiemetics for symptoms.  Hyperglycemia Notable hyperglycemia on initial chemistry Obtaining fingerstick to confirm, repeat chemistry Obtaining hemoglobin A1c  Hypokalemia Notable hypokalemia on initial chemistry We will repeat chemistry and replace if necessary  Hyponatremia Notable modest hyponatremia on initial chemistry Possibly chronic Obtain repeat chemistry to confirm.  If persisting, will work-up hyponatremia while considering trial of intravenous fluids  Essential  hypertension Permissive hypertension in the setting of acute stroke. PRN intravenous antihypertensives for excessively elevated blood pressure higher than 220/115    Systemic lupus erythematosus (HCC) Continue home regimen of daily hydroxychloroquine.  Mixed hyperlipidemia Continue home regimen statin therapy Lipid panel ordered for the morning  Restless leg syndrome Continue home regimen of ropinirole  Abnormal computed tomography angiography (CTA) CT angiogram revealing questionable outpouching from the right ICA terminus possibly suggestive of a small aneurysm  Images reviewed and case discussed with Dr. Derry Lory with neurology who states that this can be followed up on as an outpatient.      Code Status:  Full code  code status decision has been confirmed with: daughter Family Communication: Daughter has been updated on plan of care at the bedside  Status is: Observation  The patient remains OBS appropriate and will d/c before 2 midnights.       Marinda Elk MD [facility] Pager [phone]  If 7PM-7AM, please contact night-coverage [email or web address] Use universal [place] password for that web site.  If you do not have the password, please call the hospital operator.  06/28/2021, 4:50 AM

## 2021-06-27 NOTE — ED Notes (Signed)
Pt was brought in by daughter in law. Daughter in law is the primary caregiver after the passing of her husband 7 years ago.   Pt was noted to have acute onset of confusion with last known normal 3:30 pm today. Pt was found to have soiled on self, had developed slurred speech, and was more confused than usual. Pt also complains of dizziness and gait instability.    At baseline pt is ambulatory without assistance device and is able to care for her self. Daughter in law reports pt has been increasingly depressed over the past few days and has not been as active as her usual. Pt does not have an official diagnosis, but is confused to time at baseline.   On assessment, pt is A&Ox3, disoriented to time and somewhat oriented to situation. Pt unable to provide her DOB, which is her usual. Follows commands. Continues to have minor facial droop, ataxia, and left side complete hemianopia. Pt continues to experience agitation, where she is scratching at limbs, restless in bed, and moving legs up into the air. No new onset of confusion. Per daughter-in-law pt has restless leg syndrome and sometimes becomes agitated like this at home, usually resolved with tramadol. Given benadryl x2 and tramadol.

## 2021-06-27 NOTE — ED Notes (Signed)
Pt awake and drinking water.

## 2021-06-27 NOTE — ED Notes (Signed)
Report given to [facility] RN @ [facility] RN with [employer]

## 2021-06-28 ENCOUNTER — Observation Stay (HOSPITAL_COMMUNITY): Payer: Medicare Other

## 2021-06-28 ENCOUNTER — Encounter (HOSPITAL_COMMUNITY): Payer: Self-pay | Admitting: Internal Medicine

## 2021-06-28 DIAGNOSIS — I63542 Cerebral infarction due to unspecified occlusion or stenosis of left cerebellar artery: Secondary | ICD-10-CM | POA: Diagnosis present

## 2021-06-28 DIAGNOSIS — E782 Mixed hyperlipidemia: Secondary | ICD-10-CM

## 2021-06-28 DIAGNOSIS — G2581 Restless legs syndrome: Secondary | ICD-10-CM | POA: Diagnosis present

## 2021-06-28 DIAGNOSIS — R9389 Abnormal findings on diagnostic imaging of other specified body structures: Secondary | ICD-10-CM | POA: Diagnosis not present

## 2021-06-28 DIAGNOSIS — I10 Essential (primary) hypertension: Secondary | ICD-10-CM | POA: Diagnosis present

## 2021-06-28 DIAGNOSIS — R29709 NIHSS score 9: Secondary | ICD-10-CM | POA: Diagnosis present

## 2021-06-28 DIAGNOSIS — E871 Hypo-osmolality and hyponatremia: Secondary | ICD-10-CM | POA: Diagnosis present

## 2021-06-28 DIAGNOSIS — R42 Dizziness and giddiness: Secondary | ICD-10-CM | POA: Diagnosis not present

## 2021-06-28 DIAGNOSIS — M329 Systemic lupus erythematosus, unspecified: Secondary | ICD-10-CM | POA: Diagnosis present

## 2021-06-28 DIAGNOSIS — E876 Hypokalemia: Secondary | ICD-10-CM | POA: Diagnosis present

## 2021-06-28 DIAGNOSIS — I639 Cerebral infarction, unspecified: Secondary | ICD-10-CM | POA: Diagnosis not present

## 2021-06-28 DIAGNOSIS — Z79899 Other long term (current) drug therapy: Secondary | ICD-10-CM | POA: Diagnosis not present

## 2021-06-28 DIAGNOSIS — M1990 Unspecified osteoarthritis, unspecified site: Secondary | ICD-10-CM | POA: Diagnosis present

## 2021-06-28 DIAGNOSIS — Z888 Allergy status to other drugs, medicaments and biological substances status: Secondary | ICD-10-CM | POA: Diagnosis not present

## 2021-06-28 DIAGNOSIS — R4182 Altered mental status, unspecified: Secondary | ICD-10-CM | POA: Diagnosis present

## 2021-06-28 DIAGNOSIS — R739 Hyperglycemia, unspecified: Secondary | ICD-10-CM | POA: Diagnosis present

## 2021-06-28 DIAGNOSIS — M810 Age-related osteoporosis without current pathological fracture: Secondary | ICD-10-CM | POA: Diagnosis present

## 2021-06-28 DIAGNOSIS — Z7983 Long term (current) use of bisphosphonates: Secondary | ICD-10-CM | POA: Diagnosis not present

## 2021-06-28 DIAGNOSIS — Z20822 Contact with and (suspected) exposure to covid-19: Secondary | ICD-10-CM | POA: Diagnosis present

## 2021-06-28 DIAGNOSIS — I429 Cardiomyopathy, unspecified: Secondary | ICD-10-CM | POA: Diagnosis present

## 2021-06-28 HISTORY — DX: Essential (primary) hypertension: I10

## 2021-06-28 HISTORY — DX: Mixed hyperlipidemia: E78.2

## 2021-06-28 HISTORY — DX: Restless legs syndrome: G25.81

## 2021-06-28 LAB — ECHOCARDIOGRAM COMPLETE
AR max vel: 2.29 cm2
AV Area VTI: 2.14 cm2
AV Area mean vel: 2.16 cm2
AV Mean grad: 4 mmHg
AV Peak grad: 7 mmHg
Ao pk vel: 1.33 m/s
Area-P 1/2: 4.68 cm2
Calc EF: 63.3 %
Height: 60 in
S' Lateral: 2.5 cm
Single Plane A2C EF: 73.4 %
Single Plane A4C EF: 58.8 %
Weight: 1887.14 oz

## 2021-06-28 LAB — COMPREHENSIVE METABOLIC PANEL
ALT: 25 U/L (ref 0–44)
ALT: 19 U/L (ref 0–44)
AST: 59 U/L — ABNORMAL HIGH (ref 15–41)
AST: 32 U/L (ref 15–41)
Albumin: 3.9 g/dL (ref 3.5–5.0)
Albumin: 3.6 g/dL (ref 3.5–5.0)
Alkaline Phosphatase: 73 U/L (ref 38–126)
Alkaline Phosphatase: 63 U/L (ref 38–126)
Anion gap: 11 (ref 5–15)
Anion gap: 9 (ref 5–15)
BUN: 14 mg/dL (ref 8–23)
BUN: 15 mg/dL (ref 8–23)
CO2: 20 mmol/L — ABNORMAL LOW (ref 22–32)
CO2: 23 mmol/L (ref 22–32)
Calcium: 9.2 mg/dL (ref 8.9–10.3)
Calcium: 9.2 mg/dL (ref 8.9–10.3)
Chloride: 101 mmol/L (ref 98–111)
Chloride: 101 mmol/L (ref 98–111)
Creatinine, Ser: 0.78 mg/dL (ref 0.44–1.00)
Creatinine, Ser: 0.7 mg/dL (ref 0.44–1.00)
GFR, Estimated: >60 mL/min (ref >60)
GFR, Estimated: >60 mL/min (ref >60)
Glucose, Bld: 80 mg/dL (ref 70–99)
Glucose, Bld: 83 mg/dL (ref 70–99)
Potassium: 4.8 mmol/L (ref 3.5–5.1)
Potassium: 3.1 mmol/L — ABNORMAL LOW (ref 3.5–5.1)
Sodium: 132 mmol/L — ABNORMAL LOW (ref 135–145)
Sodium: 133 mmol/L — ABNORMAL LOW (ref 135–145)
Total Bilirubin: 1.8 mg/dL — ABNORMAL HIGH (ref 0.3–1.2)
Total Bilirubin: 0.6 mg/dL (ref 0.3–1.2)
Total Protein: 7.8 g/dL (ref 6.5–8.1)
Total Protein: 7.4 g/dL (ref 6.5–8.1)

## 2021-06-28 LAB — CBC WITH DIFFERENTIAL/PLATELET
Abs Immature Granulocytes: 0.02 10*3/uL (ref 0.00–0.07)
Abs Immature Granulocytes: 0.02 10*3/uL (ref 0.00–0.07)
Basophils Absolute: 0 10*3/uL (ref 0.0–0.1)
Basophils Absolute: 0.1 10*3/uL (ref 0.0–0.1)
Basophils Relative: 1 %
Basophils Relative: 1 %
Eosinophils Absolute: 0.1 10*3/uL (ref 0.0–0.5)
Eosinophils Absolute: 0.1 10*3/uL (ref 0.0–0.5)
Eosinophils Relative: 2 %
Eosinophils Relative: 2 %
HCT: 34.5 % — ABNORMAL LOW (ref 36.0–46.0)
HCT: 37.9 % (ref 36.0–46.0)
Hemoglobin: 11.6 g/dL — ABNORMAL LOW (ref 12.0–15.0)
Hemoglobin: 13.1 g/dL (ref 12.0–15.0)
Immature Granulocytes: 0 %
Immature Granulocytes: 0 %
Lymphocytes Relative: 13 %
Lymphocytes Relative: 9 %
Lymphs Abs: 0.4 10*3/uL — ABNORMAL LOW (ref 0.7–4.0)
Lymphs Abs: 0.6 10*3/uL — ABNORMAL LOW (ref 0.7–4.0)
MCH: 31.6 pg (ref 26.0–34.0)
MCH: 32 pg (ref 26.0–34.0)
MCHC: 33.6 g/dL (ref 30.0–36.0)
MCHC: 34.6 g/dL (ref 30.0–36.0)
MCV: 92.4 fL (ref 80.0–100.0)
MCV: 94 fL (ref 80.0–100.0)
Monocytes Absolute: 0.6 10*3/uL (ref 0.1–1.0)
Monocytes Absolute: 0.8 10*3/uL (ref 0.1–1.0)
Monocytes Relative: 14 %
Monocytes Relative: 14 %
Neutro Abs: 3.2 10*3/uL (ref 1.7–7.7)
Neutro Abs: 3.9 10*3/uL (ref 1.7–7.7)
Neutrophils Relative %: 70 %
Neutrophils Relative %: 74 %
Platelets: 271 10*3/uL (ref 150–400)
Platelets: 284 10*3/uL (ref 150–400)
RBC: 3.67 MIL/uL — ABNORMAL LOW (ref 3.87–5.11)
RBC: 4.1 MIL/uL (ref 3.87–5.11)
RDW: 13 % (ref 11.5–15.5)
RDW: 13.1 % (ref 11.5–15.5)
WBC: 4.6 10*3/uL (ref 4.0–10.5)
WBC: 5.2 10*3/uL (ref 4.0–10.5)
nRBC: 0 % (ref 0.0–0.2)
nRBC: 0 % (ref 0.0–0.2)

## 2021-06-28 LAB — MAGNESIUM
Magnesium: 2.2 mg/dL (ref 1.7–2.4)
Magnesium: 1.9 mg/dL (ref 1.7–2.4)

## 2021-06-28 LAB — LIPID PANEL
Cholesterol: 143 mg/dL (ref 0–200)
HDL: 62 mg/dL (ref >40)
LDL Cholesterol: 73 mg/dL (ref 0–99)
Total CHOL/HDL Ratio: 2.3 RATIO
Triglycerides: 40 mg/dL (ref <150)
VLDL: 8 mg/dL (ref 0–40)

## 2021-06-28 LAB — GLUCOSE, CAPILLARY
Glucose-Capillary: 106 mg/dL — ABNORMAL HIGH (ref 70–99)
Glucose-Capillary: 106 mg/dL — ABNORMAL HIGH (ref 70–99)
Glucose-Capillary: 83 mg/dL (ref 70–99)
Glucose-Capillary: 97 mg/dL (ref 70–99)

## 2021-06-28 LAB — HEMOGLOBIN A1C
Hgb A1c MFr Bld: 5.9 % — ABNORMAL HIGH (ref 4.8–5.6)
Mean Plasma Glucose: 122.63 mg/dL

## 2021-06-28 IMAGING — MR MR HEAD W/O CM
14 of 15 series · 44 of 48 positions shown · non-contrast
Comparison: No prior MRI, correlation is made with CT head
[DATE]

CLINICAL DATA: Neuro deficit, stroke suspected

EXAM:
MRI HEAD WITHOUT CONTRAST
TECHNIQUE: Multiplanar, multiecho pulse sequences of the brain and surrounding
structures were obtained without intravenous contrast.

[Series 5: DWI · axial · 3.0mm · 0.88mm/px · z∈[-4,+127]mm · 7 of 96 slices shown (1 of 4)]
[im 1/96]
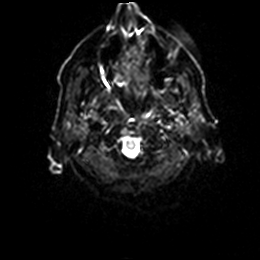
[im 16/96]
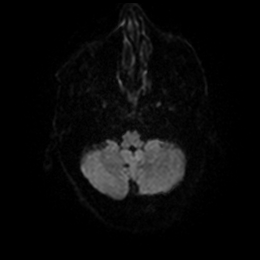
[im 32/96]
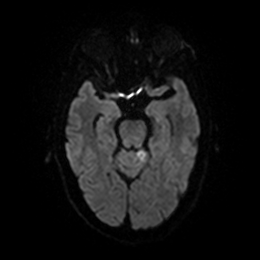
[im 48/96]
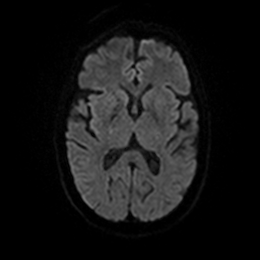
[im 64/96]
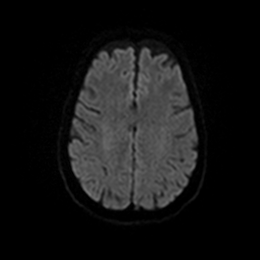
[im 80/96]
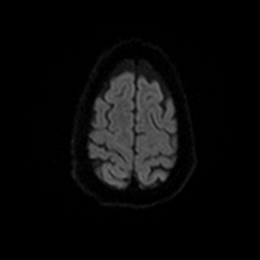
[im 96/96]
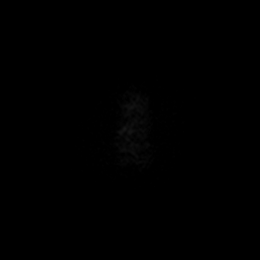

[Series 6: DWI · axial · 3.0mm · 0.88mm/px · z∈[-4,+127]mm · 3 of 47 slices shown (2 of 4)]
[im 1/47]
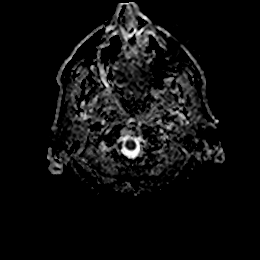
[im 24/47]
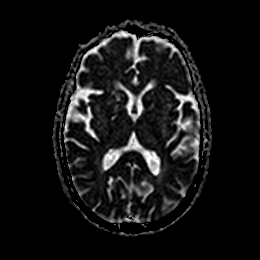
[im 47/47]
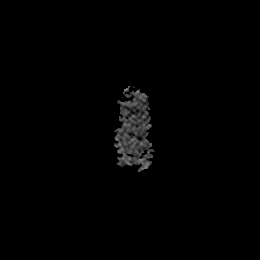

[Series 7: DWI · coronal · 4.0mm · 0.88mm/px · 4 of 66 slices shown (3 of 4)]
[im 1/66]
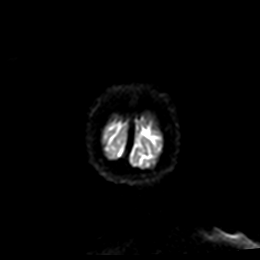
[im 22/66]
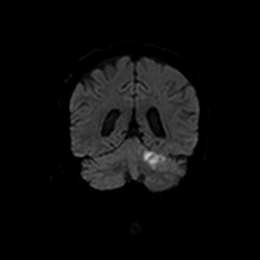
[im 44/66]
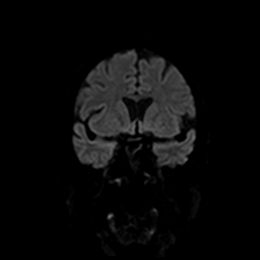
[im 66/66]
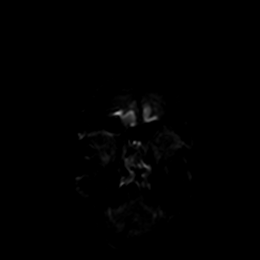

[Series 8: DWI · coronal · 4.0mm · 0.88mm/px · 2 of 33 slices shown (4 of 4)]
[im 1/33]
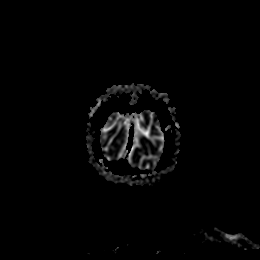
[im 33/33]
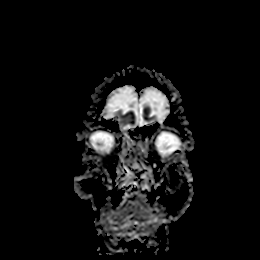

[Series 9: T1 · sagittal · 5.0mm · 0.75mm/px · 1 of 23 slices shown]
[im 1/23]
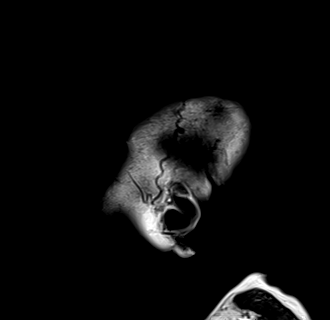

[Series 10: T2 · axial · 5.0mm · 0.72mm/px · z∈[-5,+129]mm · 2 of 25 slices shown (1 of 2)]
[im 1/25]
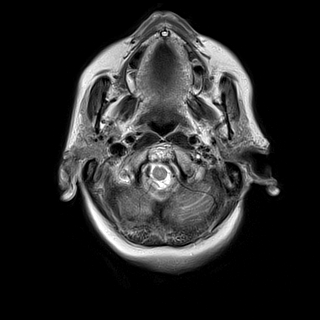
[im 25/25]
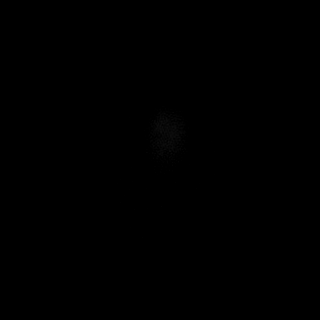

[Series 11: FLAIR · axial · 5.0mm · 0.45mm/px · z∈[-11,+123]mm · 2 of 25 slices shown]
[im 1/25]
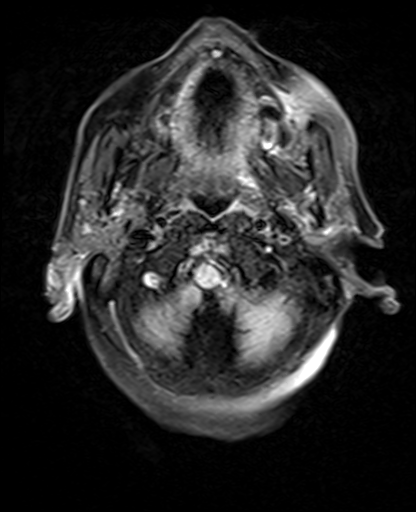
[im 25/25]
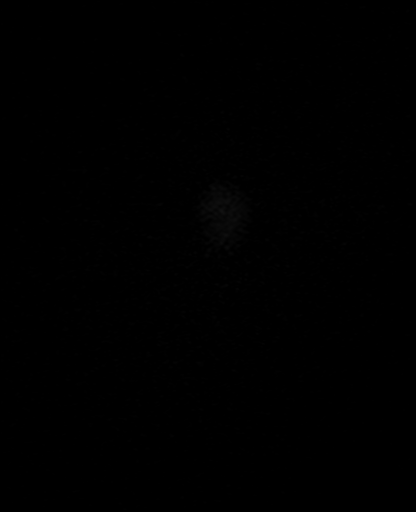

[Series 12: mag_images · axial · 3.0mm · 0.90mm/px · z∈[-26,+138]mm · 4 of 60 slices shown]
[im 1/60]
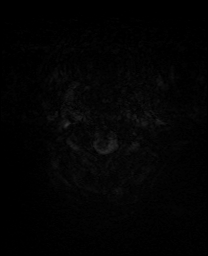
[im 20/60]
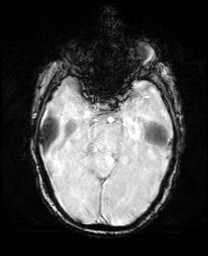
[im 40/60]
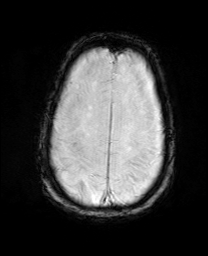
[im 60/60]
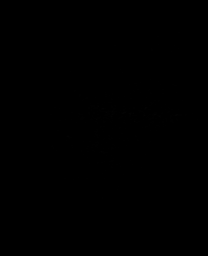

[Series 13: pha_images · axial · 3.0mm · 0.90mm/px · z∈[-23,+127]mm · 3 of 53 slices shown (1 of 2)]
[im 1/53]
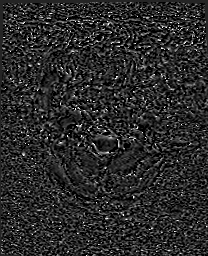
[im 27/53]
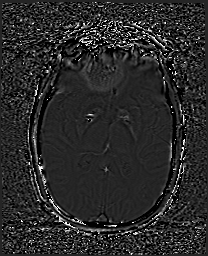
[im 53/53]
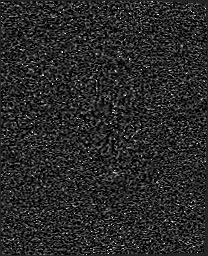

[Series 14: swi_images · axial · 3.0mm · 0.90mm/px · z∈[-26,+138]mm · 4 of 60 slices shown (1 of 2)]
[im 1/60]
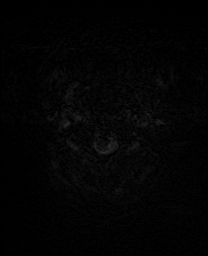
[im 20/60]
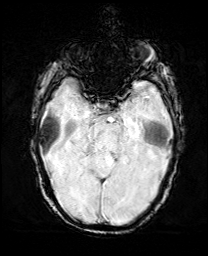
[im 40/60]
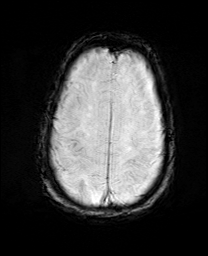
[im 60/60]
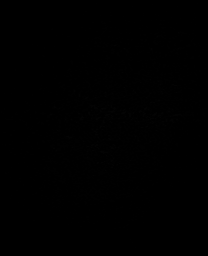

[Series 17: T2 · coronal · 5.0mm · 0.34mm/px · 2 of 29 slices shown (2 of 2)]
[im 1/29]
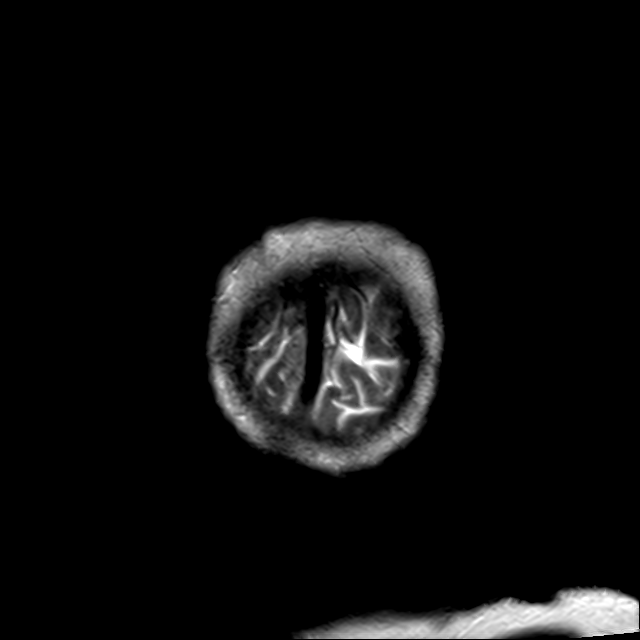
[im 29/29]
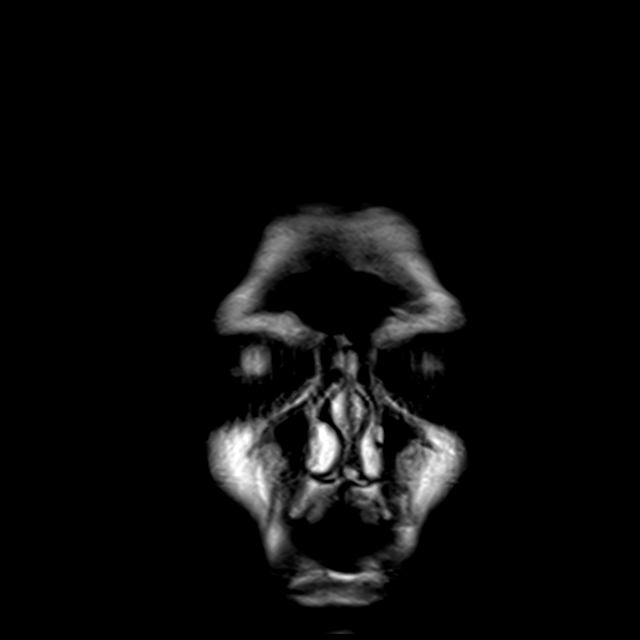

[Series 19: pha_images · axial · 3.0mm · 0.90mm/px · z∈[-26,+113]mm · 3 of 50 slices shown (2 of 2)]
[im 1/50]
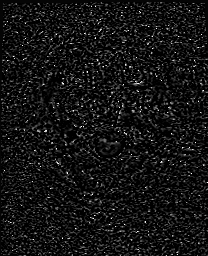
[im 25/50]
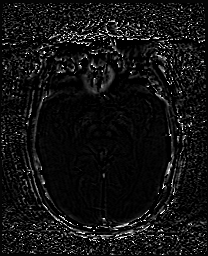
[im 50/50]
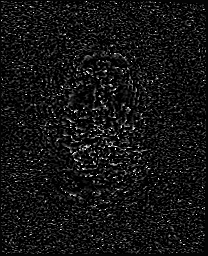

[Series 20: swi_images · axial · 3.0mm · 0.90mm/px · z∈[-26,+138]mm · 4 of 60 slices shown (2 of 2)]
[im 1/60]
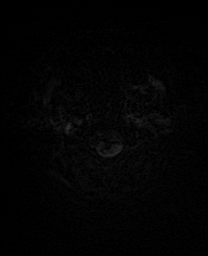
[im 20/60]
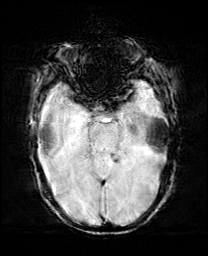
[im 40/60]
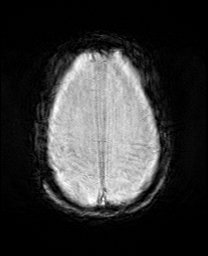
[im 60/60]
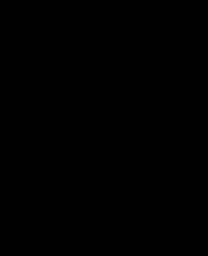

[Series 21: mip_images(sw) · axial · 24.0mm · 0.90mm/px · z∈[-16,+128]mm · 3 of 53 slices shown]
[im 1/53]
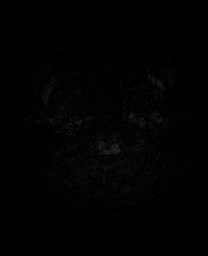
[im 27/53]
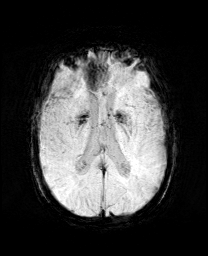
[im 53/53]
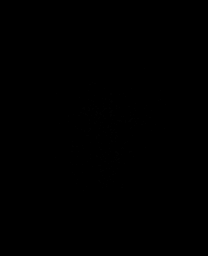

[44 of 48 positions shown; findings below may reference images not displayed]

FINDINGS: Brain: Restricted diffusion in the superomedial left cerebellum,
which measures 2.4 x 2.7 x 1.3 cm (series 5, image 61 and series 7,
image 45). This area is associated with increased T2 signal, likely
mild edema, and hemosiderin deposition. This causes mild local mass
effect without significant narrowing of the fourth ventricle. No
midline shift. No hydrocephalus or extra-axial collection. T2
hyperintense signal in the periventricular white matter and pons,
likely the sequela of moderate chronic small vessel ischemic
disease. Lacunar infarct in the left greater than right basal
ganglia and left corona radiata.

Vascular: Normal flow voids.

Skull and upper cervical spine: Normal marrow signal.

Sinuses/Orbits: Negative.

Other: None.
IMPRESSION: Acute left SCA territory infarct, with associated with hemosiderin
deposition, likely petechial hemorrhage, and mild edema, which
causes mild local mass effect without significant narrowing of the
fourth ventricle.

These results were called by telephone at the time of interpretation
on [DATE] at [DATE] to provider MONGE , who verbally
acknowledged these results.

## 2021-06-28 MED ORDER — LACTATED RINGERS IV SOLN: INTRAVENOUS | Status: AC

## 2021-06-28 MED ORDER — HYDRALAZINE HCL 20 MG/ML IJ SOLN: 10.0000 mg | Freq: Four times a day (QID) | INTRAMUSCULAR | Status: DC | PRN

## 2021-06-28 MED ORDER — HYDRALAZINE HCL 50 MG PO TABS
50.0000 mg | ORAL_TABLET | Freq: Two times a day (BID) | ORAL | Status: DC
  Administered 2021-06-28: 02:00:00 50 mg via ORAL
  Filled 2021-06-28: qty 1

## 2021-06-28 MED ORDER — CLOPIDOGREL BISULFATE 75 MG PO TABS
75.0000 mg | ORAL_TABLET | Freq: Every day | ORAL | Status: DC
  Administered 2021-06-28 – 2021-06-30 (×3): 75 mg via ORAL
  Filled 2021-06-28 (×4): qty 1

## 2021-06-28 MED ORDER — WHITE PETROLATUM EX OINT
TOPICAL_OINTMENT | CUTANEOUS | Status: AC
  Filled 2021-06-28: qty 28.35

## 2021-06-28 MED ORDER — IRBESARTAN 300 MG PO TABS: 300.0000 mg | ORAL_TABLET | Freq: Every day | ORAL | Status: DC

## 2021-06-28 MED ORDER — ASPIRIN 325 MG PO TABS
325.0000 mg | ORAL_TABLET | Freq: Every day | ORAL | Status: DC
  Administered 2021-06-28 – 2021-06-30 (×3): 325 mg via ORAL
  Filled 2021-06-28 (×4): qty 1

## 2021-06-28 MED ORDER — ENOXAPARIN SODIUM 40 MG/0.4ML IJ SOSY
40.0000 mg | PREFILLED_SYRINGE | INTRAMUSCULAR | Status: DC
  Administered 2021-06-29 – 2021-06-30 (×2): 40 mg via SUBCUTANEOUS
  Filled 2021-06-28 (×2): qty 0.4

## 2021-06-28 MED ORDER — POTASSIUM CHLORIDE CRYS ER 20 MEQ PO TBCR
40.0000 meq | EXTENDED_RELEASE_TABLET | Freq: Once | ORAL | Status: AC
  Administered 2021-06-28: 16:00:00 40 meq via ORAL
  Filled 2021-06-28: qty 2

## 2021-06-28 NOTE — Evaluation (Addendum)
Occupational Therapy Evaluation Patient Details Name: Kim [name] MRN: [id number] DOB: September 28, 1945 Today's Date: 06/28/2021   History of Present Illness 75 year old Punjabi-speaking female who presented to med [facility] ED with sudden onset of confusion and dizziness, transferred to [facility]. 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 Pt with past medical history of SLE (follows with [facility] rheumatology), hypertension, restless leg syndrome and osteoporosis   Clinical Impression   Pt was generally indep PTA; she lives with her daughter in a 2 level home, 3 STE, and is able to stay on the main level with bed/bath. Pt's daughter present and translating throughout session. Upon evaluation pt was limited by generalized weakness, poor activity tolerance, dizziness, nausea and impaired balance. She required supervision for bed mobility and up to min A for transfers and functional ambulation with RW. She also required up to min A for balance and safety in standing. Pt will benefit from continued OT acutely. Recommend d/c  to [facility] to progress pt back to her indep baseline prior to d/c home with family.     Recommendations for follow up therapy are one component of a multi-disciplinary discharge planning process, led by the attending physician.  Recommendations may be updated based on patient status, additional functional criteria and insurance authorization.   Follow Up Recommendations  Acute inpatient rehab (3hours/day) ([facility].)    Assistance Recommended at Discharge Set up Supervision/Assistance  Functional Status Assessment  Patient has had a recent decline in their functional status and demonstrates the ability to make significant improvements in function in a reasonable and predictable amount of time.  Equipment Recommendations  BSC/3in1 (youth RW)    Recommendations for Other Services       Precautions / Restrictions  Precautions Precautions: Fall Restrictions Weight Bearing Restrictions: No      Mobility Bed Mobility Overal bed mobility: Needs Assistance Bed Mobility: Supine to Sit;Sit to Supine     Supine to sit: Min guard Sit to supine: Supervision   General bed mobility comments: Increased time and effort to complete. close guard to transition fully to EOB.    Transfers Overall transfer level: Needs assistance Equipment used: Rolling walker (2 wheels) Transfers: Sit to/from Stand Sit to Stand: Min assist           General transfer comment: Min      Balance Overall balance assessment: Needs assistance Sitting-balance support: Feet supported Sitting balance-Leahy Scale: Fair     Standing balance support: No upper extremity supported;During functional activity Standing balance-Leahy Scale: Fair Standing balance comment: able to stand at the sink & wash face without UE support                           ADL either performed or assessed with clinical judgement   ADL Overall ADL's : Needs assistance/impaired Eating/Feeding: Independent;Sitting   Grooming: Wash/dry face;Min guard;Standing Grooming Details (indicate cue type and reason): at the sink Upper Body Bathing: Supervision/ safety;Sitting   Lower Body Bathing: Minimal assistance;Sit to/from stand   Upper Body Dressing : Supervision/safety;Sitting;Set up   Lower Body Dressing: Min guard;Set up;Sit to/from stand Lower Body Dressing Details (indicate cue type and reason): able to don socks while sitting EOB Toilet Transfer: Minimal assistance;Ambulation;Rolling walker (2 wheels) Toilet Transfer Details (indicate cue type and reason): youth walker Toileting- Clothing Manipulation and Hygiene: Min guard;Sitting/lateral lean       Functional mobility during ADLs: Minimal assistance;Rolling walker (  2 wheels) General ADL Comments: Pt was limited by nausea this session. Limited to ambulation wtih RW from bed to  the sink for grooming and back. Wiht reports of dizziness upon sitting     Vision   Additional Comments: per chart, pt has L visual field deficit. unable to assess at eval due to nausea.     Perception     Praxis      Pertinent Vitals/Pain Pain Assessment: No/denies pain Faces Pain Scale: Hurts a little bit Pain Location: generalized with movement Pain Descriptors / Indicators: Discomfort Pain Intervention(s): Monitored during session     Hand Dominance     Extremity/Trunk Assessment Upper Extremity Assessment Upper Extremity Assessment: Defer to OT evaluation   Lower Extremity Assessment Lower Extremity Assessment: Generalized weakness   Cervical / Trunk Assessment Cervical / Trunk Assessment: Kyphotic   Communication Communication Communication: Prefers language other than English (daughter in the room to translate)   Cognition Arousal/Alertness: Awake/alert Behavior During Therapy: WFL for tasks assessed/performed Overall Cognitive Status: Within Functional Limits for tasks assessed                                 General Comments: difficult to fully assess due to language barrier.     General Comments  VSS on RA, daughters present and translating    Exercises     Shoulder Instructions      Home Living Family/patient expects to be discharged to:: Private residence Living Arrangements: Children Available Help at Discharge: Available PRN/intermittently (daughter is off of work next week; family coming to town for holidays & can assist) Type of Home: House Home Access: Stairs to enter Secretary/administrator of Steps: 3 Entrance Stairs-Rails: Right;Left Home Layout: Two level;Able to live on main level with bedroom/bathroom Alternate Level Stairs-Number of Steps: flight   Bathroom Shower/Tub: Chief Strategy Officer: Standard Bathroom Accessibility: No   Home Equipment: None   Additional Comments: pt's daughter works during the  day. She confirmed they can work out 24/7 with family at d/c.      Prior Functioning/Environment Prior Level of Function : Independent/Modified Independent             Mobility Comments: no AD. Pt's daughter reports she is typically able to function alone at home during the day while family works. ADLs Comments: generally indep        OT Problem List: Decreased strength;Decreased range of motion;Decreased activity tolerance;Decreased coordination;Decreased safety awareness;Decreased knowledge of use of DME or AE;Decreased knowledge of precautions;Pain      OT Treatment/Interventions: Self-care/ADL training;Therapeutic exercise;DME and/or AE instruction;Balance training;Patient/family education;Therapeutic activities    OT Goals(Current goals can be found in the care plan section) Acute Rehab OT Goals Patient Stated Goal: did not state OT Goal Formulation: With patient Time For Goal Achievement: 07/12/21 Potential to Achieve Goals: Good ADL Goals Pt Will Perform Grooming: with modified independence;standing Pt Will Perform Lower Body Bathing: with modified independence;sit to/from stand Pt Will Perform Lower Body Dressing: with modified independence;sit to/from stand Pt Will Transfer to Toilet: with modified independence;ambulating Pt/caregiver will Perform Home Exercise Program: Increased ROM;Increased strength;Both right and left upper extremity;With written HEP provided  OT Frequency: Min 2X/week   Barriers to D/C: Decreased caregiver support      AM-PAC OT "6 Clicks" Daily Activity     Outcome Measure Help from another person eating meals?: None Help from another person taking care of personal  grooming?: A Little Help from another person toileting, which includes using toliet, bedpan, or urinal?: A Little Help from another person bathing (including washing, rinsing, drying)?: A Little Help from another person to put on and taking off regular upper body clothing?: A  Little Help from another person to put on and taking off regular lower body clothing?: A Little 6 Click Score: 19   End of Session Equipment Utilized During Treatment: Rolling walker (2 wheels) Nurse Communication: Mobility status  Activity Tolerance:   Patient left: in bed;with call bell/phone within reach;with family/visitor present  OT Visit Diagnosis: Unsteadiness on feet (R26.81);Other abnormalities of gait and mobility (R26.89);Muscle weakness (generalized) (M62.81);Pain                Time: 7681-1572 OT Time Calculation (min): 18 min Charges:  OT General Charges $OT Visit: 1 Visit OT Evaluation $OT Eval Moderate Complexity: 1 Mod   Dequane Strahan A Nayelis Bonito 06/28/2021, 10:19 AM

## 2021-06-28 NOTE — Assessment & Plan Note (Addendum)
·   Notable hyperglycemia on initial chemistry  Obtaining fingerstick to confirm, repeat chemistry  Obtaining hemoglobin A1c

## 2021-06-28 NOTE — Progress Notes (Signed)
PROGRESS NOTE  Kim [name]  DOB: 1946/01/27  PCP: Curt Jews, PA-C [id number]  DOA: 06/26/2021  LOS: 0 days  Hospital Day: 3  Chief Complaint  Patient presents with   Altered Mental Status   Brief narrative: Kim [name] is a 75 y.o. female with PMH significant for SLE, HTN, HLD, GERD, restless leg syndrome, osteoporosis. Patient presented to [facility] ED on 12/17 with complaint of sudden onset of confusion, dizziness, nausea, vomiting  In the ED, symptoms are concerning for posterior circulation stroke. Teleneurology was consulted. Patient was felt to be outside of tPA window. CT head did not show any acute intracranial process but showed old bilateral basal ganglia and lacunar infarcts. CT angio of head and neck did not show any hemodynamically significant stenosis but showed a 2 mm size infundibulum from the origin of the right posterior communicating artery, possibly occluded.  It also showed multifocal bilateral small artery stenosis Patient was given aspirin 325 mg, Plavix 300 mg and admitted for stroke work-up Admitted to hospitalist service on 12/18. MRI brain showed an acute left superior cerebellar artery territory infarct with associated hemosiderin deposition, likely petechial hemorrhage and mild edema causing mild local mass-effect Neurology consulted.  Subjective: Patient was seen and examined this morning.  Pleasant elderly female of [place] Asian origin.  Propped up in bed.  Looks weak.  Daughter at bedside Chart reviewed Blood pressure has been running elevated in last 24 hours as high as 202/104.    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 -Stroke work-up ordered. -Echo with EF 60 to 65%, moderate LVH, grade 1 diastolic dysfunction.   -PT eval obtained.  Rehab recommended. -Continue neuro check and telemetry monitoring -PTA, patient was not on any antiplatelet/anticoagulant.  She has been started on aspirin and Plavix -Antiemetic for symptom control.  Accelerated hypertension -Blood pressure in last 24 hours was elevated up to [id number] systolic.  Given as needed medicines. -Home meds include amlodipine 5 mg daily, hydralazine 50 mg twice daily, valsartan 300 mg daily -Permissive hypertension allowed for first 48 hours.  Will resume blood pressure medicine gradually.  Hyperlipidemia -Continue Crestor -Lipid panel 12/19 with HDL 62, LDL 73  Hyperglycemia -1 episode of blood sugar level elevated to 211 on admission -No history of diabetes mellitus, A1c 5.9 on 2/19 -Blood sugar level has normalized.  Hypokalemia -Potassium level low at 3.1 today.  Replacement ordered. Recent Labs  Lab 06/26/21 2055 06/28/21 0032 06/28/21 0412  K 2.9* 4.8 3.1*  MG  --  2.2 1.9   Hyponatremia -Between 130 and 135.  Continue to monitor Recent Labs  Lab 06/26/21 2055 06/28/21 0032 06/28/21 0412  NA 130* 132* 133*   Systemic lupus erythematosus -Continue azathioprine and hydroxychloroquine.    Restless leg syndrome Continue ropinirole  Chronic arthritis -Continue Cymbalta and tramadol   Mobility: PT/OT/ST eval Living condition: Lives at home Goals of [name]:   Code Status: Full Code  Nutritional status: Body mass index is 23.03 kg/m.      Diet:  Diet Order             Diet Heart Room service appropriate? Yes; Fluid consistency: Thin  Diet effective now  DVT prophylaxis:  enoxaparin (LOVENOX) injection 30 mg Start: 06/28/21 1000   Antimicrobials: None Fluid: None Consultants: Neurology Family Communication: Daughter at  bedside  Status is: Observation  Continue in-hospital [name] because: Needs to continue stroke work-up, rehab placement recommended Level of [name]: Telemetry Medical   Dispo: The patient is from: Home              Anticipated d/c is to: Rehab              Patient currently is not medically stable to d/c.   Difficult to place patient No     Infusions:     Scheduled Meds:  aspirin  325 mg Oral Daily   azaTHIOprine  50 mg Oral Daily   clopidogrel  75 mg Oral Daily   DULoxetine  20 mg Oral Daily   enoxaparin (LOVENOX) injection  30 mg Subcutaneous Q24H   hydroxychloroquine  200 mg Oral Daily   insulin aspart  0-15 Units Subcutaneous Q6H   potassium chloride  40 mEq Oral Once   rOPINIRole  1 mg Oral QHS   rosuvastatin  20 mg Oral Daily   traMADol  50 mg Oral BID    PRN meds: acetaminophen **OR** acetaminophen, hydrALAZINE, lip balm, ondansetron **OR** ondansetron (ZOFRAN) IV, polyethylene glycol   Antimicrobials: Anti-infectives (From admission, onward)    Start     Dose/Rate Route Frequency Ordered Stop   06/28/21 1000  hydroxychloroquine (PLAQUENIL) tablet 200 mg        200 mg Oral Daily 06/27/21 2319         Objective: Vitals:   06/28/21 0253 06/28/21 0900  BP: (!) 142/95 (!) 141/75  Pulse: 82 83  Resp: 18 16  Temp: 98.4 F (36.9 C) 98 F (36.7 C)  SpO2: 98% 97%   No intake or output data in the 24 hours ending 06/28/21 1539 Filed Weights   06/26/21 2100 06/27/21 2216  Weight: 52.2 kg 53.5 kg   Weight change: 1.3 kg Body mass index is 23.03 kg/m.   Physical Exam: General exam: Pleasant, elderly female.  Not in physical distress Skin: No rashes, lesions or ulcers. HEENT: Atraumatic, normocephalic, no obvious bleeding Lungs: Clear to auscultate bilaterally CVS: Regular rate and rhythm, no murmur GI/Abd soft, nontender, nondistended, bowel sound present CNS: Alert, awake, knows she is in the hospital, not oriented to time which is her baseline per  daughter Psychiatry: Sad affect Extremities: No pedal edema, no calf tenderness  Data Review: I have personally reviewed the laboratory data and studies available.  F/u labs ordered Unresulted Labs (From admission, onward)     Start     Ordered   06/29/21 0500  CBC with Differential/Platelet  Daily,   R     Question:  Specimen collection method  Answer:  Lab=Lab collect   06/28/21 1539   06/29/21 0500  Basic metabolic panel  Daily,   R     Question:  Specimen collection method  Answer:  Lab=Lab collect   06/28/21 1539            Signed, Lorin Glass, MD [facility] 06/28/2021

## 2021-06-28 NOTE — Progress Notes (Signed)
Inpatient Rehab Admissions Coordinator Note:   Per PT/OT patient was screened for CIR candidacy by Sumiye Hirth Luvenia Starch, CCC-SLP. Note pt is under observation status and that family is requesting [employer].   Wolfgang Phoenix, MS, CCC-SLP Admissions Coordinator [phone] 06/28/21 12:59 PM

## 2021-06-28 NOTE — Assessment & Plan Note (Signed)
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 Derry Lory with neurology who states that this can be followed up on as an outpatient.

## 2021-06-28 NOTE — Assessment & Plan Note (Signed)
·   Continue home regimen of ropinirole

## 2021-06-28 NOTE — Assessment & Plan Note (Addendum)
·   Patient presenting with acute onset of vertigo with vomiting and unsteady gait  Upon initial evaluation to [facility] there was concern for possible posterior circulation stroke  On my evaluation symptoms seem to have improved substantially but worsen with attempts at ambulation.     MRI brain confirming right cerebellar stroke with petechial hemorrhage and some mass effect  Case discussed with Dr. Vallarie Mare - stroke team to follow the patient today   Patient receiving ASA/Plavix for DAPT  Performing serial neurologic checks  Permissive hypertension.  Monitoring on telemetry  Echocardiogram ordered for the morning   Serial neurologic checks  PT, OT evaluations ordered  SLP evaluation ordered  As needed antiemetics for symptoms.

## 2021-06-28 NOTE — Evaluation (Signed)
Physical Therapy Evaluation Patient Details Name: Kim [name] MRN: [id number] DOB: May 30, 1946 Today's Date: 06/28/2021  History of Present Illness  75 year old Punjabi-speaking female who presented to [facility] ED with sudden onset of confusion and dizziness, transferred to [facility]. 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 Pt with past medical history of SLE (follows with [facility] rheumatology), hypertension, restless leg syndrome and osteoporosis   Clinical Impression  Pt admitted with above diagnosis. Pt currently with functional limitations due to the deficits listed below (see PT Problem List). At the time of PT eval pt was able to perform transfers and ambulation with gross min assist for balance support and safety with RW for support. Daughter present and able to assist with translation per their request. Daughter reports if pt can progress to a level where pt would not require physical assist they will be able to arrange up to 24 hour assistance/supervision upon return home. Acutely, pt will benefit from skilled PT to increase their independence and safety with mobility to allow discharge to the venue listed below.        Recommendations for follow up therapy are one component of a multi-disciplinary discharge planning process, led by the attending physician.  Recommendations may be updated based on patient status, additional functional criteria and insurance authorization.  Follow Up Recommendations Acute inpatient rehab (3hours/day) (Pt's daughter requesting AIR in [place] closer proximity to family)    Assistance Recommended at Discharge Frequent or constant Supervision/Assistance  Functional Status Assessment Patient has had a recent decline in their functional status and demonstrates the ability to make significant improvements in function in a reasonable and predictable amount of time.  Equipment Recommendations  Rolling walker (2  wheels) (youth size)    Recommendations for Other Services       Precautions / Restrictions Precautions Precautions: Fall Restrictions Weight Bearing Restrictions: No      Mobility  Bed Mobility Overal bed mobility: Needs Assistance Bed Mobility: Supine to Sit;Sit to Supine     Supine to sit: Min guard Sit to supine: Supervision   General bed mobility comments: Increased time and effort to complete. close guard to transition fully to EOB.    Transfers Overall transfer level: Needs assistance Equipment used: Rolling walker (2 wheels) Transfers: Sit to/from Stand Sit to Stand: Min assist           General transfer comment: Assist for power up to full stand and to gain/maintain standing balance.    Ambulation/Gait Ambulation/Gait assistance: Min assist Gait Distance (Feet): 30 Feet Assistive device: Rolling walker (2 wheels) Gait Pattern/deviations: Shuffle;Trunk flexed;Narrow base of support Gait velocity: Decreased Gait velocity interpretation: <1.31 ft/sec, indicative of household ambulator   General Gait Details: Slow and unsteady with frequent assist for balance support and walker management.  Stairs            Wheelchair Mobility    Modified Rankin (Stroke Patients Only) Modified Rankin (Stroke Patients Only) Pre-Morbid Rankin Score: No symptoms Modified Rankin: Moderately severe disability     Balance Overall balance assessment: Needs assistance Sitting-balance support: Feet supported Sitting balance-Leahy Scale: Fair     Standing balance support: No upper extremity supported;During functional activity Standing balance-Leahy Scale: Fair Standing balance comment: able to stand at the sink & wash face without UE support                             Pertinent  Vitals/Pain Pain Assessment: No/denies pain    Home Living Family/patient expects to be discharged to:: Private residence Living Arrangements: Children Available Help at  Discharge: Available PRN/intermittently (daughter is off of work next week; family coming to town for holidays & can assist) Type of Home: House Home Access: Stairs to enter Entrance Stairs-Rails: Doctor, general practice of Steps: 3 Alternate Level Stairs-Number of Steps: flight Home Layout: Two level;Able to live on main level with bedroom/bathroom Home Equipment: None Additional Comments: pt's daughter works during the day. She confirmed they can work out 24/7 with family at d/c.    Prior Function Prior Level of Function : Independent/Modified Independent             Mobility Comments: no AD. Pt's daughter reports she is typically able to function alone at home during the day while family works. ADLs Comments: generally indep     Hand Dominance        Extremity/Trunk Assessment   Upper Extremity Assessment Upper Extremity Assessment: Defer to OT evaluation    Lower Extremity Assessment Lower Extremity Assessment: Generalized weakness (Difficult to assess due to language barrier and following commands for MMT)    Cervical / Trunk Assessment Cervical / Trunk Assessment: Kyphotic  Communication   Communication: Prefers language other than English (daughter in the room to translate)  Cognition Arousal/Alertness: Awake/alert Behavior During Therapy: WFL for tasks assessed/performed Overall Cognitive Status: Within Functional Limits for tasks assessed                                 General Comments: difficult to fully assess due to language barrier.        General Comments      Exercises     Assessment/Plan    PT Assessment Patient needs continued PT services  PT Problem List Decreased strength;Decreased range of motion;Decreased activity tolerance;Decreased balance;Decreased mobility;Decreased knowledge of use of DME;Decreased safety awareness;Decreased knowledge of precautions;Pain       PT Treatment Interventions DME  instruction;Gait training;Functional mobility training;Stair training;Therapeutic activities;Therapeutic exercise;Neuromuscular re-education;Patient/family education    PT Goals (Current goals can be found in the Care Plan section)  Acute Rehab PT Goals Patient Stated Goal: Pt did not state goals. Pt's daughter's goal is for pt to be able to eventually return home. PT Goal Formulation: With family Time For Goal Achievement: 07/12/21 Potential to Achieve Goals: Good    Frequency Min 4X/week   Barriers to discharge        Co-evaluation               AM-PAC PT "6 Clicks" Mobility  Outcome Measure Help needed turning from your back to your side while in a flat bed without using bedrails?: A Little Help needed moving from lying on your back to sitting on the side of a flat bed without using bedrails?: A Little Help needed moving to and from a bed to a chair (including a wheelchair)?: A Little Help needed standing up from a chair using your arms (e.g., wheelchair or bedside chair)?: A Little Help needed to walk in hospital room?: A Little Help needed climbing 3-5 steps with a railing? : Total 6 Click Score: 16    End of Session Equipment Utilized During Treatment: Gait belt Activity Tolerance: Patient tolerated treatment well Patient left: in bed;with call bell/phone within reach;with bed alarm set;with family/visitor present Nurse Communication: Mobility status PT Visit Diagnosis: Unsteadiness on feet (R26.81);Other symptoms  and signs involving the nervous system (R29.898)    Time: 1157-2620 PT Time Calculation (min) (ACUTE ONLY): 31 min   Charges:   PT Evaluation $PT Eval Moderate Complexity: 1 Mod PT Treatments $Gait Training: 8-22 mins        Conni Slipper, PT, DPT Acute Rehabilitation Services Pager: [phone] Office: [phone]   Marylynn Pearson 06/28/2021, 12:01 PM

## 2021-06-28 NOTE — Assessment & Plan Note (Signed)
·   Notable hypokalemia on initial chemistry  We will repeat chemistry and replace if necessary

## 2021-06-28 NOTE — Progress Notes (Signed)
°  Echocardiogram 2D Echocardiogram has been performed.  Janalyn Harder 06/28/2021, 1:54 PM

## 2021-06-28 NOTE — Assessment & Plan Note (Signed)
·   Notable modest hyponatremia on initial chemistry  Possibly chronic  Obtain repeat chemistry to confirm.  If persisting, will work-up hyponatremia while considering trial of intravenous fluids

## 2021-06-28 NOTE — Consult Note (Addendum)
Stroke Neurology Consultation Note  Consult Requested by: Dr. Pietro Cassis  Reason for Consult: Sudden onset confusion and dizziness  Consult Date:  06/28/21  The history was obtained from the patient, patient's daughter and chart review.  During history and examination, all items were  able to obtain unless otherwise noted.  History of Present Illness:  Kim [name] is an 75 y.o. Asian female with PMH of SLE, HTN, HLD, GERD, restless leg syndrome and osteoporosis presents with sudden onset dizziness, nausea and vomiting and lethargy.  She denies any history of extremity weakness but states recruitment was off and she was walking like a drunk.  She denies any diplopia, headache, focal extremity weakness or numbness.  She has no prior history of strokes TIAs or significant neurological problems.  She denies any history of atrial fibrillation or syncopal event but does admit to palpitations and irregular heart rhythm.  She is followed by Dr. Otho Perl cardiologist in [facility] for hypertension and mild aortic stenosis and cardiomyopathy.  Date last known well: Date: 06/26/2021 Time last known well: Time: 15:30 tPA Given: No: presented outside window MRS:  2 NIHSS:  9 on arrival  Past Medical History:  Diagnosis Date   Arthritis    Depression    Essential hypertension 06/28/2021   Mixed hyperlipidemia 06/28/2021     History reviewed. No pertinent surgical history.  Family History  Problem Relation Age of Onset   Heart disease Neg Hx      Social History:  reports that she has never smoked. She has never used smokeless tobacco. She reports that she does not drink alcohol and does not use drugs.  Review of Systems: A full ROS was attempted today and was able to be performed.  Systems assessed include - Constitutional,  Respiratory, Cardiovascular, Neurological, Behavioral/Psych,  - with pertinent responses as per HPI.  Allergies:  Allergies  Allergen Reactions   Gabapentin Other (See  Comments)    unknown unknown    Labetalol Other (See Comments)    Bradycardia   Lisinopril Other (See Comments)   Nebivolol Other (See Comments)    Bradycardia,     Medications: Prior to Admission:  Medications Prior to Admission  Medication Sig Dispense Refill Last Dose   amLODipine (NORVASC) 5 MG tablet Take 5 mg by mouth daily.   06/26/2021   azaTHIOprine (IMURAN) 50 MG tablet Take 50 mg by mouth daily.   06/26/2021   DULoxetine (CYMBALTA) 20 MG capsule Take 20 mg by mouth daily.   06/26/2021   hydrALAZINE (APRESOLINE) 50 MG tablet Take 50 mg by mouth in the morning and at bedtime.   06/26/2021   hydroxychloroquine (PLAQUENIL) 200 MG tablet Take 200 mg by mouth daily.   06/26/2021   rOPINIRole (REQUIP) 1 MG tablet Take 1 mg by mouth at bedtime.   06/26/2021   rosuvastatin (CRESTOR) 20 MG tablet Take 20 mg by mouth daily.   06/26/2021   traMADol (ULTRAM) 50 MG tablet Take 50 mg by mouth 2 (two) times daily.   06/26/2021   valsartan (DIOVAN) 320 MG tablet Take 320 mg by mouth daily.   06/26/2021   Vitamin D, Ergocalciferol, (DRISDOL) 1.25 MG (50000 UNIT) CAPS capsule Take 50,000 Units by mouth every 7 (seven) days.   06/24/2021   acetaminophen (TYLENOL) 500 MG tablet Take 2 tablets (1,000 mg total) by mouth every 6 (six) hours as needed. (Patient not taking: Reported on 06/27/2021) 30 tablet 0 Not Taking   amoxicillin-clavulanate (AUGMENTIN) 875-125 MG tablet Take 1  tablet by mouth 2 (two) times daily. One po bid x 7 days (Patient not taking: Reported on 06/27/2021) 14 tablet 0 Not Taking   HYDROcodone-acetaminophen (NORCO/VICODIN) 5-325 MG per tablet Take 1 tablet by mouth every 4 (four) hours as needed for pain. (Patient not taking: Reported on 06/27/2021) 20 tablet 0 Not Taking   LORazepam (ATIVAN) 1 MG tablet Take 1 tablet (1 mg total) by mouth at bedtime as needed for anxiety. (Patient not taking: Reported on 06/27/2021) 10 tablet 0 Not Taking   Scheduled:  aspirin  325 mg Oral  Daily   azaTHIOprine  50 mg Oral Daily   clopidogrel  75 mg Oral Daily   DULoxetine  20 mg Oral Daily   enoxaparin (LOVENOX) injection  30 mg Subcutaneous Q24H   hydroxychloroquine  200 mg Oral Daily   insulin aspart  0-15 Units Subcutaneous Q6H   rOPINIRole  1 mg Oral QHS   rosuvastatin  20 mg Oral Daily   traMADol  50 mg Oral BID   Continuous: [id number] **OR** acetaminophen, hydrALAZINE, lip balm, ondansetron **OR** ondansetron (ZOFRAN) IV, polyethylene glycol  Test Results: CBC:  Recent Labs  Lab 06/28/21 0032 06/28/21 0412  WBC 4.6 5.2  NEUTROABS 3.2 3.9  HGB 13.1 11.6*  HCT 37.9 34.5*  MCV 92.4 94.0  PLT 271 [id number]   Basic Metabolic Panel:  Recent Labs  Lab 06/28/21 0032 06/28/21 0412  NA 132* 133*  K 4.8 3.1*  CL 101 101  CO2 20* 23  GLUCOSE 80 83  BUN 14 15  CREATININE 0.78 0.70  CALCIUM 9.2 9.2  MG 2.2 1.9   Liver Function Tests: Recent Labs  Lab 06/26/21 2055 06/28/21 0032 06/28/21 0412  AST 32 59* 32  ALT 21 25 19   ALKPHOS 70 73 63  BILITOT 0.5 1.8* 0.6  PROT 8.6* 7.8 7.4  ALBUMIN 4.5 3.9 3.6   No results for input(s): LIPASE, AMYLASE in the last 168 hours. No results for input(s): AMMONIA in the last 168 hours. Coagulation Studies:  Recent Labs    06/26/21 2055  LABPROT 12.8  INR 1.0   Cardiac Enzymes: No results for input(s): CKTOTAL, CKMB, CKMBINDEX, TROPONINI in the last 168 hours. BNP: Invalid input(s): POCBNP CBG:  Recent Labs  Lab 06/26/21 2100 06/28/21 0033 06/28/21 1235  GLUCAP 211* 83 97   Urinalysis:  Recent Labs  Lab 06/26/21 2231  COLORURINE YELLOW  LABSPEC 1.015  PHURINE 7.5  GLUCOSEU 250*  HGBUR TRACE*  BILIRUBINUR NEGATIVE  KETONESUR NEGATIVE  PROTEINUR NEGATIVE  NITRITE NEGATIVE  LEUKOCYTESUR NEGATIVE   Microbiology:  Results for orders placed or performed during the hospital encounter of 06/26/21  Resp Panel by RT-PCR (Flu A&B, Covid) Nasopharyngeal Swab     Status: None   Collection Time:  06/26/21  9:30 PM   Specimen: Nasopharyngeal Swab; Nasopharyngeal(NP) swabs in vial transport medium  Result Value Ref Range Status   SARS Coronavirus 2 by RT PCR NEGATIVE NEGATIVE Final    Comment: (NOTE) SARS-CoV-2 target nucleic acids are NOT DETECTED.  The SARS-CoV-2 RNA is generally detectable in upper respiratory specimens during the acute phase of infection. The lowest concentration of SARS-CoV-2 viral copies this assay can detect is 138 copies/mL. A negative result does not preclude SARS-Cov-2 infection and should not be used as the sole basis for treatment or other patient management decisions. A negative result may occur with  improper specimen collection/handling, submission of specimen other than nasopharyngeal swab, presence of viral mutation(s) within the areas targeted  by this assay, and inadequate number of viral copies(<138 copies/mL). A negative result must be combined with clinical observations, patient history, and epidemiological information. The expected result is Negative.  Fact Sheet for Patients:  EntrepreneurPulse.com.au  Fact Sheet for Healthcare Providers:  IncredibleEmployment.be  This test is no t yet approved or cleared by the [place] FDA and  has been authorized for detection and/or diagnosis of SARS-CoV-2 by FDA under an Emergency Use Authorization (EUA). This EUA will remain  in effect (meaning this test can be used) for the duration of the COVID-19 declaration under Section 564(b)(1) of the Act, 21 U.S.C.section 360bbb-3(b)(1), unless the authorization is terminated  or revoked sooner.       Influenza A by PCR NEGATIVE NEGATIVE Final   Influenza B by PCR NEGATIVE NEGATIVE Final    Comment: (NOTE) The Xpert Xpress SARS-CoV-2/FLU/RSV plus assay is intended as an aid in the diagnosis of influenza from Nasopharyngeal swab specimens and should not be used as a sole basis for treatment. Nasal washings  and aspirates are unacceptable for Xpert Xpress SARS-CoV-2/FLU/RSV testing.  Fact Sheet for Patients: EntrepreneurPulse.com.au  Fact Sheet for Healthcare Providers: IncredibleEmployment.be  This test is not yet approved or cleared by the [place] FDA and has been authorized for detection and/or diagnosis of SARS-CoV-2 by FDA under an Emergency Use Authorization (EUA). This EUA will remain in effect (meaning this test can be used) for the duration of the COVID-19 declaration under Section 564(b)(1) of the Act, 21 U.S.C. section 360bbb-3(b)(1), unless the authorization is terminated or revoked.  Performed at [facility], [address] [place], [place] [address]    Lipid Panel:     Component Value Date/Time   CHOL 143 06/28/2021 0032   TRIG 40 06/28/2021 0032   HDL 62 06/28/2021 0032   CHOLHDL 2.3 06/28/2021 0032   VLDL 8 06/28/2021 0032   LDLCALC 73 06/28/2021 0032   HgbA1c:  Lab Results  Component Value Date   HGBA1C 5.9 (H) 06/28/2021   Urine Drug Screen:     Component Value Date/Time   LABOPIA NONE DETECTED 06/26/2021 2231   COCAINSCRNUR NONE DETECTED 06/26/2021 2231   LABBENZ NONE DETECTED 06/26/2021 2231   AMPHETMU NONE DETECTED 06/26/2021 2231   THCU NONE DETECTED 06/26/2021 2231   LABBARB NONE DETECTED 06/26/2021 2231    Alcohol Level:  Recent Labs  Lab 06/26/21 2055  ETH <10    CT Angio Head W or Wo Contrast  Result Date: 06/26/2021 CLINICAL DATA:  Neuro deficit, stroke suspected, dizziness, confusion EXAM: CT ANGIOGRAPHY HEAD AND NECK TECHNIQUE: Multidetector CT imaging of the head and neck was performed using the standard protocol during bolus administration of intravenous contrast. Multiplanar CT image reconstructions and MIPs were obtained to evaluate the vascular anatomy. Carotid stenosis measurements (when applicable) are obtained utilizing NASCET criteria, using the distal internal carotid  diameter as the denominator. CONTRAST:  148mL OMNIPAQUE IOHEXOL 350 MG/ML SOLN COMPARISON:  No prior CTA, correlation is made with CT head 06/26/2021 and 04/29/2020 FINDINGS: CT HEAD FINDINGS For noncontrast findings, please see same day CT head. CTA NECK FINDINGS Aortic arch: Standard branching. Imaged portion shows no evidence of aneurysm or dissection. No significant stenosis of the major arch vessel origins. Right carotid system: No evidence of dissection, stenosis (50% or greater) or occlusion. Calcified plaque at the bifurcation and in the proximal right ICA is not hemodynamically significant. Retropharyngeal course of the right ICA. Left carotid system: No evidence of dissection, stenosis (  50% or greater) or occlusion. Calcifications at the bifurcation and in the proximal left ICA are not hemodynamically significant. 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 The bilateral extracranial vertebral arteries are patent, without significant stenosis, dissection, or occlusion. Skeleton: Straightening and mild reversal of the normal cervical lordosis. No acute osseous abnormality. Other neck: Negative. Upper chest: Apical pleural-parenchymal scarring. No focal pulmonary opacity or pleural effusion. Review of the MIP images confirms the above findings CTA HEAD FINDINGS Anterior circulation: Both internal carotid arteries are patent to the termini, with mild calcifications in the cavernous and supraclinoid segments that do not cause significant stenosis. Two mm posteriorly directed outpouching from the right ICA terminus (series 506, image 214), favored to represent an infundibulum from the origin of the right posterior communicating artery, which is not otherwise visualized, possibly occluded. A1 segments patent. Normal anterior communicating artery. Anterior cerebral arteries are patent to their distal  aspects. No M1 stenosis or occlusion. Normal MCA bifurcations. Focal moderate calcified stenosis in a left M2 branch (series 506, image 205). Distal MCA branches otherwise perfused and symmetric. Posterior circulation: Vertebral arteries are patent to the vertebrobasilar junction, with mild calcified stenosis of the right V4, distal to the PICA takeoff. Posterior inferior cerebral arteries patent bilaterally. Basilar patent to its distal aspect. Superior cerebellar arteries patent bilaterally. Mild narrowing of the right P1 (series 506, image 195). Moderate narrowing of the proximal left P2. Poor opacification of the more inferior left P3. The bilateral posterior communicating arteries are not definitively visualized. Venous sinuses: As permitted by contrast timing, patent. Anatomic variants: None significant Review of the MIP images confirms the above findings IMPRESSION: 1. Two mm outpouching from the right ICA terminus, which is favored to represent an infundibulum from the origin of the right posterior communicating artery, which is not visualized, possibly occluded, versus a very small aneurysm. 2. Focal moderate calcified stenosis in a left M2 branch. 3. Moderate narrowing in the left P2, with poor opacification of a left P3 branch. 4. Incidental note is made of duplicated right V1 segments, which merge at the level of C5-C6. 5.  No hemodynamically significant stenosis in the neck. Electronically Signed   By: Merilyn Baba M.D.   On: 06/26/2021 22:41   CT Angio Neck W and/or Wo Contrast  Result Date: 06/26/2021 CLINICAL DATA:  Neuro deficit, stroke suspected, dizziness, confusion EXAM: CT ANGIOGRAPHY HEAD AND NECK TECHNIQUE: Multidetector CT imaging of the head and neck was performed using the standard protocol during bolus administration of intravenous contrast. Multiplanar CT image reconstructions and MIPs were obtained to evaluate the vascular anatomy. Carotid stenosis measurements (when applicable)  are obtained utilizing NASCET criteria, using the distal internal carotid diameter as the denominator. CONTRAST:  138mL OMNIPAQUE IOHEXOL 350 MG/ML SOLN COMPARISON:  No prior CTA, correlation is made with CT head 06/26/2021 and 04/29/2020 FINDINGS: CT HEAD FINDINGS For noncontrast findings, please see same day CT head. CTA NECK FINDINGS Aortic arch: Standard branching. Imaged portion shows no evidence of aneurysm or dissection. No significant stenosis of the major arch vessel origins. Right carotid system: No evidence of dissection, stenosis (50% or greater) or occlusion. Calcified plaque at the bifurcation and in the proximal right ICA is not hemodynamically significant. Retropharyngeal course of the right ICA. Left carotid system: No evidence of dissection, stenosis (50% or greater) or occlusion. Calcifications at the bifurcation and in the proximal left  ICA are not hemodynamically significant. 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 The bilateral extracranial vertebral arteries are patent, without significant stenosis, dissection, or occlusion. Skeleton: Straightening and mild reversal of the normal cervical lordosis. No acute osseous abnormality. Other neck: Negative. Upper chest: Apical pleural-parenchymal scarring. No focal pulmonary opacity or pleural effusion. Review of the MIP images confirms the above findings CTA HEAD FINDINGS Anterior circulation: Both internal carotid arteries are patent to the termini, with mild calcifications in the cavernous and supraclinoid segments that do not cause significant stenosis. Two mm posteriorly directed outpouching from the right ICA terminus (series 506, image 214), favored to represent an infundibulum from the origin of the right posterior communicating artery, which is not otherwise visualized, possibly occluded. A1 segments patent. Normal anterior  communicating artery. Anterior cerebral arteries are patent to their distal aspects. No M1 stenosis or occlusion. Normal MCA bifurcations. Focal moderate calcified stenosis in a left M2 branch (series 506, image 205). Distal MCA branches otherwise perfused and symmetric. Posterior circulation: Vertebral arteries are patent to the vertebrobasilar junction, with mild calcified stenosis of the right V4, distal to the PICA takeoff. Posterior inferior cerebral arteries patent bilaterally. Basilar patent to its distal aspect. Superior cerebellar arteries patent bilaterally. Mild narrowing of the right P1 (series 506, image 195). Moderate narrowing of the proximal left P2. Poor opacification of the more inferior left P3. The bilateral posterior communicating arteries are not definitively visualized. Venous sinuses: As permitted by contrast timing, patent. Anatomic variants: None significant Review of the MIP images confirms the above findings IMPRESSION: 1. Two mm outpouching from the right ICA terminus, which is favored to represent an infundibulum from the origin of the right posterior communicating artery, which is not visualized, possibly occluded, versus a very small aneurysm. 2. Focal moderate calcified stenosis in a left M2 branch. 3. Moderate narrowing in the left P2, with poor opacification of a left P3 branch. 4. Incidental note is made of duplicated right V1 segments, which merge at the level of C5-C6. 5.  No hemodynamically significant stenosis in the neck. Electronically Signed   By: Merilyn Baba M.D.   On: 06/26/2021 22:41   MR BRAIN WO CONTRAST  Result Date: 06/28/2021 CLINICAL DATA:  Neuro deficit, stroke suspected EXAM: MRI HEAD WITHOUT CONTRAST TECHNIQUE: Multiplanar, multiecho pulse sequences of the brain and surrounding structures were obtained without intravenous contrast. COMPARISON:  No prior MRI, correlation is made with CT head 06/26/2021 FINDINGS: Brain: Restricted diffusion in the  superomedial left cerebellum, which measures 2.4 x 2.7 x 1.3 cm (series 5, image 61 and series 7, image 45). This area is associated with increased T2 signal, likely mild edema, and hemosiderin deposition. This causes mild local mass effect without significant narrowing of the fourth ventricle. No midline shift. No hydrocephalus or extra-axial collection. T2 hyperintense signal in the periventricular white matter and pons, likely the sequela of moderate chronic small vessel ischemic disease. Lacunar infarct in the left greater than right basal ganglia and left corona radiata. Vascular: Normal flow voids. Skull and upper cervical spine: Normal marrow signal. Sinuses/Orbits: Negative. Other: None. IMPRESSION: Acute left SCA territory infarct, with associated with hemosiderin deposition, likely petechial hemorrhage, and mild edema, which causes mild local mass effect without significant narrowing of the fourth ventricle. These results were called by telephone at the time of interpretation on 06/28/2021 at 3:50 am to provider [facility] ,  who verbally acknowledged these results. Electronically Signed   By: Merilyn Baba M.D.   On: 06/28/2021 03:56   DG Chest Port 1 View  Result Date: 06/26/2021 CLINICAL DATA:  Dizziness with altered mental status. EXAM: PORTABLE CHEST 1 VIEW COMPARISON:  None. FINDINGS: There is no evidence of acute infiltrate, pleural effusion or pneumothorax. The cardiac silhouette is mildly enlarged. There is marked severity tortuosity of the descending thoracic aorta. Degenerative changes are seen throughout the thoracic spine. IMPRESSION: Mild cardiomegaly, without evidence of acute cardiopulmonary disease. Electronically Signed   By: Virgina Norfolk M.D.   On: 06/26/2021 23:24   ECHOCARDIOGRAM COMPLETE  Result Date: 06/28/2021    ECHOCARDIOGRAM REPORT   Patient Name:   Kim [name] Date of Exam: 06/28/2021 Medical Rec #:  [id number]     Height:       60.0 in Accession #:     [id number]    Weight:       117.9 lb Date of Birth:  1945/11/01     BSA:          1.491 m Patient Age:    54 years      BP:           010/75 mmHg Patient Gender: F             HR:           77 bpm. Exam Location:  Inpatient Procedure: 2D Echo, 3D Echo, Cardiac Doppler and Color Doppler Indications:    Stroke  History:        Patient has no prior history of Echocardiogram examinations.                 Stroke, Signs/Symptoms:Dizziness/Lightheadedness and Altered                 Mental Status; Risk Factors:Hypertension and Dyslipidemia.  Sonographer:    Roseanna Rainbow RDCS Referring Phys: [id number] [place]  Sonographer Comments: Technically difficult study due to poor echo windows, suboptimal parasternal window and suboptimal apical window. IMPRESSIONS  1. Left ventricular ejection fraction, by estimation, is 60 to 65%. Left ventricular ejection fraction by 3D volume is 60 %. The left ventricle has normal function. The left ventricle has no regional wall motion abnormalities. There is moderate left ventricular hypertrophy. Left ventricular diastolic parameters are consistent with Grade I diastolic dysfunction (impaired relaxation).  2. Right ventricular systolic function is normal. The right ventricular size is normal. Tricuspid regurgitation signal is inadequate for assessing PA pressure.  3. The mitral valve is normal in structure. Trivial mitral valve regurgitation.  4. The aortic valve was not well visualized. Aortic valve regurgitation is mild. No aortic stenosis is present.  5. Aortic dilatation noted. There is dilatation of the ascending aorta, measuring 43 mm.  6. The inferior vena cava is normal in size with greater than 50% respiratory variability, suggesting right atrial pressure of 3 mmHg. FINDINGS  Left Ventricle: Left ventricular ejection fraction, by estimation, is 60 to 65%. Left ventricular ejection fraction by 3D volume is 60 %. The left ventricle has normal function. The left ventricle has no  regional wall motion abnormalities. The left ventricular internal cavity size was small. There is moderate left ventricular hypertrophy. Left ventricular diastolic parameters are consistent with Grade I diastolic dysfunction (impaired relaxation). Right Ventricle: The right ventricular size is normal. No increase in right ventricular wall thickness. Right ventricular systolic function is normal. Tricuspid regurgitation signal is inadequate for assessing PA pressure. Left Atrium:  Left atrial size was normal in size. Right Atrium: Right atrial size was normal in size. Pericardium: Trivial pericardial effusion is present. Mitral Valve: The mitral valve is normal in structure. Trivial mitral valve regurgitation. Tricuspid Valve: The tricuspid valve is normal in structure. Tricuspid valve regurgitation is trivial. Aortic Valve: The aortic valve was not well visualized. Aortic valve regurgitation is mild. No aortic stenosis is present. Aortic valve mean gradient measures 4.0 mmHg. Aortic valve peak gradient measures 7.0 mmHg. Aortic valve area, by VTI measures 2.14  cm. Pulmonic Valve: The pulmonic valve was not well visualized. Pulmonic valve regurgitation is not visualized. Aorta: Aortic dilatation noted and the aortic root is normal in size and structure. There is dilatation of the ascending aorta, measuring 43 mm. Venous: The inferior vena cava is normal in size with greater than 50% respiratory variability, suggesting right atrial pressure of 3 mmHg. IAS/Shunts: The interatrial septum was not well visualized.  LEFT VENTRICLE PLAX 2D LVIDd:         3.50 cm         Diastology LVIDs:         2.50 cm         LV e' medial:    3.16 cm/s LV PW:         1.30 cm         LV E/e' medial:  16.4 LV IVS:        1.30 cm         LV e' lateral:   5.50 cm/s LVOT diam:     1.90 cm         LV E/e' lateral: 9.4 LV SV:         50 LV SV Index:   34 LVOT Area:     2.84 cm        3D Volume EF                                LV 3D EF:     Left                                             ventricul LV Volumes (MOD)                            ar LV vol d, MOD    52.3 ml                    ejection A2C:                                        fraction LV vol d, MOD    50.3 ml                    by 3D A4C:                                        volume is LV vol s, MOD    13.9 ml  60 %. A2C: LV vol s, MOD    20.7 ml A4C:                           3D Volume EF: LV SV MOD A2C:   38.4 ml       3D EF:        60 % LV SV MOD A4C:   50.3 ml       LV EDV:       89 ml LV SV MOD BP:    32.4 ml       LV ESV:       36 ml                                LV SV:        54 ml RIGHT VENTRICLE             IVC RV S prime:     10.60 cm/s  IVC diam: 1.60 cm TAPSE (M-mode): 1.7 cm LEFT ATRIUM             Index        RIGHT ATRIUM          Index LA diam:        3.70 cm 2.48 cm/m   RA Area:     9.37 cm LA Vol (A2C):   30.1 ml 20.18 ml/m  RA Volume:   15.30 ml 10.26 ml/m LA Vol (A4C):   17.7 ml 11.87 ml/m LA Biplane Vol: 22.9 ml 15.36 ml/m  AORTIC VALVE AV Area (Vmax):    2.29 cm AV Area (Vmean):   2.16 cm AV Area (VTI):     2.14 cm AV Vmax:           132.50 cm/s AV Vmean:          86.800 cm/s AV VTI:            0.236 m AV Peak Grad:      7.0 mmHg AV Mean Grad:      4.0 mmHg LVOT Vmax:         107.00 cm/s LVOT Vmean:        66.000 cm/s LVOT VTI:          0.178 m LVOT/AV VTI ratio: 0.76  AORTA Ao Root diam: 3.40 cm Ao Asc diam:  4.20 cm MITRAL VALVE MV Area (PHT): 4.68 cm     SHUNTS MV Decel Time: 162 msec     Systemic VTI:  0.18 m MV E velocity: 51.90 cm/s   Systemic Diam: 1.90 cm MV A velocity: 104.00 cm/s MV E/A ratio:  0.50 Oswaldo Milian MD Electronically signed by Oswaldo Milian MD Signature Date/Time: 06/28/2021/2:14:20 PM    Final    CT HEAD CODE STROKE WO CONTRAST  Addendum Date: 06/26/2021   ADDENDUM REPORT: 06/26/2021 21:22 ADDENDUM: Code stroke imaging results were communicated on 06/26/2021 at 9:22 pm to provider Deno Etienne via  telephone, who verbally acknowledged these results. Electronically Signed   By: Merilyn Baba M.D.   On: 06/26/2021 21:22   Result Date: 06/26/2021 CLINICAL DATA:  Code stroke.  Dizziness, confusion EXAM: CT HEAD WITHOUT CONTRAST TECHNIQUE: Contiguous axial images were obtained from the base of the skull through the vertex without intravenous contrast. COMPARISON:  04/29/2020 FINDINGS: Brain: No evidence of acute infarction, hemorrhage, cerebral edema, mass, mass effect, or midline shift.  Ventricles and sulci are normal for age. No extra-axial fluid collection. Small low-density foci in the bilateral basal ganglia, likely lacunar infarcts, which are favored to be unchanged from the prior exam. Vascular: No hyperdense vessel or unexpected calcification. Skull: Normal. Negative for fracture or focal lesion. Sinuses/Orbits: No acute finding. Other: The mastoid air cells are well aerated. ASPECTS [facility] Stroke Program Early CT Score) - Ganglionic level infarction (caudate, lentiform nuclei, internal capsule, insula, M1-M3 cortex): 7 - Supraganglionic infarction (M4-M6 cortex): 3 Total score (0-10 with 10 being normal): 10 IMPRESSION: 1. No acute intracranial process. Small low-density foci in the bilateral basal ganglia, likely lacunar infarcts, which are favored to be unchanged from the prior exam 2. ASPECTS is 10 Code stroke imaging results were communicated on 06/26/2021 at 9:14 pm to provider Dr. Lorrin Goodell via secure text paging. Electronically Signed: By: Merilyn Baba M.D. On: 06/26/2021 21:15     EKG: normal EKG, normal sinus rhythm, unchanged from previous tracings.   Physical Examination: Temp:  [98 F (36.7 C)-98.4 F (36.9 C)] 98 F (36.7 C) (12/19 0900) Pulse Rate:  [51-83] 83 (12/19 0900) Resp:  [14-20] 16 (12/19 0900) BP: (122-202)/(64-104) 141/75 (12/19 0900) SpO2:  [97 %-99 %] 97 % (12/19 0900) Weight:  [53.5 kg] 53.5 kg (12/18 2216)  General - Well nourished, well developed,  elderly Jackson lady in no apparent distress.   Mental Status -  Level of arousal and orientation to time, place, and person were intact. Language was assessed and found intact. Attention span and concentration were normal. Recent and remote memory were intact  Cranial Nerves II - XII - II - Visual field intact OU. III, IV, VI - Extraocular movements intact.  Slow saccades on lateral gaze left greater than right.  No nystagmus V - Facial sensation intact bilaterally. VII - Facial movement intact bilaterally. VIII - Hearing & vestibular intact bilaterally. XII - Tongue protrusion intact.  Motor Strength - The patients strength was normal in all extremities and pronator drift was absent.  Bulk was normal and fasciculations were absent.    Sensory - Light touch was assessed and was symmetrical.    Coordination - The patient had normal movements in the hands and feet with no ataxia or dysmetria.  Tremor was absent.  Gait and Station - deferred.   Assessment:  [name] is a 75 y.o. female with history of PMH of SLE, HTN, HLD, GERD, presenting with sudden onset dizziness, nausea, vomiting and lethargy. She did not receive IV t-PA due to presenting outside of the window.   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 ASPECTS 10.    CTA head & neck 2 mm outpouching from right ICA terminus, moderate calcified stenosis in lft M2, moderate narrowing of left P2 MRI  Acute left SCA territory infarct with mild edema and mild mass effect 2D Echo EF [id number], grade 1 diastolic dysfunction, moderate LVH, interatrial septum not well visualized LDL 73 HgbA1c 5.9  SCDs for VTE prophylaxis Diet Order             Diet Heart Room service appropriate? Yes; Fluid consistency: Thin  Diet effective now                   No antithrombotic prior to admission, now on aspirin 81 mg and  Plavix 75 mg daily into 3 weeks and then aspirin alone Therapy recommendations:  CIR Disposition:  likely to CIR  Hypertension Stable Permissive hypertension (OK if < 220/120) but gradually normalize in 5-7 days Long-term BP goal normotensive  Hyperlipidemia Home meds:  rosuvastatin 20 mg daily, resumed in hospital LDL 73, goal < 70 Continue statin at discharge  Other Stroke Risk Factors Advanced age Congestive heart failure  Other Active Problems SLE Treatment per primary team  Hospital day # 0   Thank you for this consultation and allowing [place] to participate in the care of this patient.  Cortney E Ernestina Columbia , MSN, AGACNP-BC [facility] See Amion for schedule and pager information 06/28/2021 3:46 PM   STROKE MD NOTE :  I have personally obtained history,examined this patient, reviewed notes, independently viewed imaging studies, participated in medical decision making and plan of care.ROS completed by me personally and pertinent positives fully documented  I have made any additions or clarifications directly to the above note. Agree with note above.  She presented with sudden onset of dizziness, ataxia nausea and vomiting secondary to left superior cerebellar artery infarct likely of embolic etiology with source to be determined.  Neurological exam significant only for truncal imbalance.  Recommend continue ongoing stroke work-up and likely loop recorder insertion at discharge to look for paroxysmal A. fib.  Aspirin and Plavix for 3 weeks followed by Plavix alone and aggressive risk factor modification.  Continuing ongoing therapies and she will likely need inpatient rehab.  Long discussion with the patient and daughter at the bedside and answered questions.  Discussed with Dr.Dahal.  Greater than 50% time during this 80-minute consultation visit was spent on counseling and coordination of care about her embolic stroke and discussion about evaluation, prevention and  treatment and answering questions.  Delia Heady, MD Medical Director [facility] Stroke Center Pager: [phone] 06/28/2021 5:02 PM   To contact Stroke Continuity provider, please refer to [email or web address]. After hours, contact General Neurology

## 2021-06-28 NOTE — Plan of Care (Signed)

## 2021-06-28 NOTE — TOC Initial Note (Signed)
Transition of Care [facility]) - Initial/Assessment Note    Patient Details  Name: Kim [name] MRN: [id number] Date of Birth: December 13, 1945  [facility]) CM/SW Contact:    Pollie Friar, RN Phone Number: 06/28/2021, 2:57 PM  Clinical Narrative:                 CM met with the patient and her daughter at the bedside. Daughter speaks fluent [place]. CM went over the recommendations for inpatient rehab. Daughter is interested but prefers the [facility] as this location is closer to their home. CM inquired about supervision after a rehab stay and daughter states she can have family fill in and provide supervision while she is at work.  Information faxed to [facility]: [phone]. CM await decision from [facility].  TOC following.  Expected Discharge Plan: IP Rehab Facility Barriers to Discharge: Continued Medical Work up   Patient Goals and CMS Choice   CMS Medicare.gov Compare Post Acute Care list provided to:: Patient Represenative (must comment) Choice offered to / list presented to : Adult Children  Expected Discharge Plan and Services Expected Discharge Plan: [place]   Discharge Planning Services: CM Consult Post Acute Care Choice: IP Rehab Living arrangements for the past 2 months: Single Family Home                                      Prior Living Arrangements/Services Living arrangements for the past 2 months: Single Family Home Lives with:: Adult Children Patient language and need for interpreter reviewed:: Yes Do you feel safe going back to the place where you live?: Yes      Need for Family Participation in Patient Care: Yes (Comment) Care giver support system in place?: Yes (comment)   Criminal Activity/Legal Involvement Pertinent to Current Situation/Hospitalization: No - Comment as needed  Activities of Daily Living Home Assistive Devices/Equipment: None ADL Screening (condition at time of admission) Patient's cognitive ability adequate to  safely complete daily activities?: Yes Is the patient deaf or have difficulty hearing?: No Does the patient have difficulty seeing, even when wearing glasses/contacts?: No Does the patient have difficulty concentrating, remembering, or making decisions?: Yes Patient able to express need for assistance with ADLs?: Yes Does the patient have difficulty dressing or bathing?: Yes Independently performs ADLs?: Yes (appropriate for developmental age) Does the patient have difficulty walking or climbing stairs?: No Weakness of Legs: Both Weakness of Arms/Hands: Both  Permission Sought/Granted                  Emotional Assessment Appearance:: Appears stated age         Psych Involvement: No (comment)  Admission diagnosis:  Dizziness [R42] Stroke-like symptoms [R29.90] Acute focal neurological deficit [R29.818] Patient Active Problem List   Diagnosis Date Noted   Acute stroke due to occlusion of left cerebellar artery ([place]) 06/28/2021   Restless leg syndrome 06/28/2021   Essential hypertension 06/28/2021   Mixed hyperlipidemia 06/28/2021   Hypokalemia 06/28/2021   Hyponatremia 06/28/2021   Abnormal computed tomography angiography (CTA) 06/28/2021   Systemic lupus erythematosus ([place]) 06/28/2021   Hyperglycemia 06/27/2021   PCP:  Curt Jews, PA-C Pharmacy:   [id number] [id number]: [phone]:: [phone]    Social Determinants of Health (SDOH) Interventions    Readmission Risk Interventions No  flowsheet data found.

## 2021-06-28 NOTE — Assessment & Plan Note (Addendum)
•   Permissive hypertension in the setting of acute stroke.  PRN intravenous antihypertensives for excessively elevated blood pressure higher than 220/115

## 2021-06-28 NOTE — Assessment & Plan Note (Signed)
·   Continue home regimen statin therapy  Lipid panel ordered for the morning

## 2021-06-28 NOTE — Assessment & Plan Note (Signed)
·   Continue home regimen of daily hydroxychloroquine.

## 2021-06-29 LAB — CBC WITH DIFFERENTIAL/PLATELET
Abs Immature Granulocytes: 0.02 10*3/uL (ref 0.00–0.07)
Basophils Absolute: 0 10*3/uL (ref 0.0–0.1)
Basophils Relative: 1 %
Eosinophils Absolute: 0.2 10*3/uL (ref 0.0–0.5)
Eosinophils Relative: 3 %
HCT: 32.5 % — ABNORMAL LOW (ref 36.0–46.0)
Hemoglobin: 11 g/dL — ABNORMAL LOW (ref 12.0–15.0)
Immature Granulocytes: 1 %
Lymphocytes Relative: 16 %
Lymphs Abs: 0.7 10*3/uL (ref 0.7–4.0)
MCH: 31.5 pg (ref 26.0–34.0)
MCHC: 33.8 g/dL (ref 30.0–36.0)
MCV: 93.1 fL (ref 80.0–100.0)
Monocytes Absolute: 0.7 10*3/uL (ref 0.1–1.0)
Monocytes Relative: 15 %
Neutro Abs: 2.8 10*3/uL (ref 1.7–7.7)
Neutrophils Relative %: 64 %
Platelets: 264 10*3/uL (ref 150–400)
RBC: 3.49 MIL/uL — ABNORMAL LOW (ref 3.87–5.11)
RDW: 13 % (ref 11.5–15.5)
WBC: 4.4 10*3/uL (ref 4.0–10.5)
nRBC: 0 % (ref 0.0–0.2)

## 2021-06-29 LAB — BASIC METABOLIC PANEL
Anion gap: 8 (ref 5–15)
BUN: 15 mg/dL (ref 8–23)
CO2: 24 mmol/L (ref 22–32)
Calcium: 8.8 mg/dL — ABNORMAL LOW (ref 8.9–10.3)
Chloride: 97 mmol/L — ABNORMAL LOW (ref 98–111)
Creatinine, Ser: 0.66 mg/dL (ref 0.44–1.00)
GFR, Estimated: >60 mL/min (ref >60)
Glucose, Bld: 96 mg/dL (ref 70–99)
Potassium: 3.7 mmol/L (ref 3.5–5.1)
Sodium: 129 mmol/L — ABNORMAL LOW (ref 135–145)

## 2021-06-29 LAB — RESP PANEL BY RT-PCR (FLU A&B, COVID) ARPGX2
Influenza A by PCR: NEGATIVE
Influenza B by PCR: NEGATIVE
SARS Coronavirus 2 by RT PCR: NEGATIVE

## 2021-06-29 LAB — GLUCOSE, CAPILLARY
Glucose-Capillary: 101 mg/dL — ABNORMAL HIGH (ref 70–99)
Glucose-Capillary: 116 mg/dL — ABNORMAL HIGH (ref 70–99)
Glucose-Capillary: 88 mg/dL (ref 70–99)

## 2021-06-29 MED ORDER — HYDRALAZINE HCL 50 MG PO TABS
50.0000 mg | ORAL_TABLET | Freq: Two times a day (BID) | ORAL | Status: DC
  Administered 2021-06-29 – 2021-06-30 (×3): 50 mg via ORAL
  Filled 2021-06-29 (×3): qty 1

## 2021-06-29 MED ORDER — INSULIN ASPART 100 UNIT/ML IJ SOLN: 0.0000 [IU] | Freq: Three times a day (TID) | INTRAMUSCULAR | Status: DC

## 2021-06-29 MED ORDER — IRBESARTAN 300 MG PO TABS
300.0000 mg | ORAL_TABLET | Freq: Every day | ORAL | Status: DC
  Administered 2021-06-29 – 2021-06-30 (×2): 300 mg via ORAL
  Filled 2021-06-29 (×2): qty 1

## 2021-06-29 MED ORDER — AMLODIPINE BESYLATE 5 MG PO TABS
5.0000 mg | ORAL_TABLET | Freq: Every day | ORAL | Status: DC
  Administered 2021-06-29 – 2021-06-30 (×2): 5 mg via ORAL
  Filled 2021-06-29 (×2): qty 1

## 2021-06-29 MED ORDER — INSULIN ASPART 100 UNIT/ML IJ SOLN: 0.0000 [IU] | Freq: Every day | INTRAMUSCULAR | Status: DC

## 2021-06-29 NOTE — Progress Notes (Signed)
Tele called to say pt HR was dropping into the upper 40s.  Was 92 when they called me.  I did go check her and I got HR of 52 when I awakened her.  She was asymptomatic and was annoyed that I had to awaken her.  No signs of distress. Following commands.  Did send a secure chat message to Dr Pola Corn to report this.

## 2021-06-29 NOTE — Progress Notes (Signed)
PROGRESS NOTE  Kim Kim [name]  DOB: 1946/04/14  PCP: Kim Kim [name] [id number]  DOA: 06/26/2021  LOS: 1 day  Hospital Day: 4  Chief Complaint  Patient presents with   Altered Mental Status   Brief narrative: Kim Kim [name] is a 75 y.o. female with PMH significant for SLE, HTN, HLD, GERD, restless leg syndrome, osteoporosis. Patient presented to [facility] ED on 12/17 with complaint of sudden onset of confusion, dizziness, nausea, vomiting  In the ED, symptoms are concerning for posterior circulation stroke. Teleneurology was consulted. Patient was felt to be outside of tPA window. CT head did not show any acute intracranial process but showed old bilateral basal ganglia and lacunar infarcts. CT angio of head and neck did not show any hemodynamically significant stenosis but showed a 2 mm size infundibulum from the origin of the right posterior communicating artery, possibly occluded.  It also showed multifocal bilateral small artery stenosis Patient was given aspirin 325 mg, Plavix 300 mg and admitted for stroke work-up Admitted to hospitalist service on 12/18. MRI brain showed an acute left superior cerebellar artery territory infarct with associated hemosiderin deposition, likely petechial hemorrhage and mild edema causing mild local mass-effect Neurology consulted.  Subjective: Patient was seen and examined this morning.   Sitting up at the edge of the bed.  Not in distress.  No new symptoms.  We had a lengthy conversation in her native language of Hindi.   Family not at bedside.   Loop recorder placement planned for tomorrow.    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 -Stroke work-up completed -Echo with EF 60 to 65%, moderate LVH, grade 1 diastolic dysfunction.   -PT eval obtained.  Rehab recommended. -Continue neuro check and telemetry monitoring -PTA, patient was not on any antiplatelet/anticoagulant.  Per neurology recommendation, she has been started on aspirin and Plavix.  DAPT to continue for 3 weeks followed by aspirin alone. -Antiemetic for symptom control.  Accelerated hypertension -Blood pressure in last 24 hours was elevated up to [id number] systolic.   -Home meds include amlodipine 5 mg daily, hydralazine 50 mg twice daily, valsartan 300 mg daily -All resumed.    Hyperlipidemia -Continue Crestor -Lipid panel 12/19 with HDL 62, LDL 73  Hyponatremia -Sodium level down to 129 today.  I switched her from heart healthy to regular diet.  Continue to monitor. Recent Labs  Lab 06/26/21 2055 06/28/21 0032 06/28/21 0412 06/29/21 0259  NA 130* 132* 133* 129*   Systemic lupus erythematosus -Continue azathioprine and hydroxychloroquine.    Restless leg syndrome Continue ropinirole  Chronic arthritis -Continue Cymbalta and tramadol   Mobility: PT/OT/ST eval Living condition: Lives at home Goals of Kim [name]:   Code Status: Full Code  Nutritional status: Body mass index is 23.03 kg/m.      Diet:  Diet Order             Diet regular Room service appropriate? Yes; Fluid consistency: Thin  Diet effective now                  DVT prophylaxis:  enoxaparin (LOVENOX) injection 40 mg Start: 06/29/21 1000 Place and maintain sequential compression device Start: 06/28/21 1543   Antimicrobials: None Fluid: None Consultants: Neurology  Family Communication: Daughter not at bedside  Status is: Observation  Continue in-hospital Kim [name] because: Needs to continue stroke work-up, rehab placement recommended Level of Kim [name]: Telemetry Medical   Dispo: The patient is from:  Home              Anticipated d/c is to: Rehab likely tomorrow              Patient currently is not medically stable to d/c.   Difficult to place patient No     Infusions:     Scheduled Meds:  amLODipine  5 mg Oral Daily   aspirin  325 mg Oral Daily   azaTHIOprine  50 mg Oral Daily   clopidogrel  75 mg Oral Daily   DULoxetine  20 mg Oral Daily   enoxaparin (LOVENOX) injection  40 mg Subcutaneous Q24H   hydrALAZINE  50 mg Oral BID   hydroxychloroquine  200 mg Oral Daily   insulin aspart  0-9 Units Subcutaneous TID WC   irbesartan  300 mg Oral Daily   rOPINIRole  1 mg Oral QHS   rosuvastatin  20 mg Oral Daily   traMADol  50 mg Oral BID    PRN meds: acetaminophen **OR** acetaminophen, hydrALAZINE, lip balm, ondansetron **OR** ondansetron (ZOFRAN) IV, polyethylene glycol   Antimicrobials: Anti-infectives (From admission, onward)    Start     Dose/Rate Route Frequency Ordered Stop   06/28/21 1000  hydroxychloroquine (PLAQUENIL) tablet 200 mg        200 mg Oral Daily 06/27/21 2319         Objective: Vitals:   06/29/21 0320 06/29/21 0902  BP: (!) 151/78 134/84  Pulse: (!) 54 69  Resp: 20   Temp: 97.7 F (36.5 C) 98.2 F (36.8 C)  SpO2: 97% 97%    Intake/Output Summary (Last 24 hours) at 06/29/2021 1405 Last data filed at 06/29/2021 0900 Gross per 24 hour  Intake 240 ml  Output --  Net 240 ml   Filed Weights   06/26/21 2100 06/27/21 2216  Weight: 52.2 kg 53.5 kg   Weight change:  Body mass index is 23.03 kg/m.   Physical Exam: General exam: Pleasant, elderly female.  Not in physical distress Skin: No rashes, lesions or ulcers. HEENT: Atraumatic, normocephalic, no obvious bleeding Lungs: Clear to auscultation bilaterally CVS: Regular rate and rhythm, no murmur GI/Abd soft, nontender, nondistended, bowel sound present CNS: Alert, awake, knows she is in the hospital, not oriented to time which is her baseline per daughter Psychiatry: Sad  affect Extremities: No pedal edema, no calf tenderness  Data Review: I have personally reviewed the laboratory data and studies available.  F/u labs ordered Unresulted Labs (From admission, onward)     Start     Ordered   06/29/21 0500  CBC with Differential/Platelet  Daily,   R     Question:  Specimen collection method  Answer:  Lab=Lab collect   06/28/21 1539   06/29/21 0500  Basic metabolic panel  Daily,   R     Question:  Specimen collection method  Answer:  Lab=Lab collect   06/28/21 1539            Signed, Lorin Glass, MD [facility] 06/29/2021

## 2021-06-29 NOTE — Progress Notes (Signed)
[facility] IP rehab admissions - stopped by patient room.  Gave patient/family member a Physiological scientist.  The plan is for inpatient rehab admission at [facility] per family request and per case manager, Tresa Endo.  I will be the back up plan in case needed.  Call for questions. [phone]

## 2021-06-29 NOTE — Evaluation (Signed)
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 No pertinent surgical history. HPI:  75yo female admitted 06/26/21 with AMS, dizziness, n/v. PMH: arthritis, depression, HLD, HTN, GERD, RLS, osteoporosis. MRI = right cerebellar stroke with petechial hemorrhage and some mass effect. Confusion to time at baseline   Assessment / Plan / Recommendation Clinical Impression  Pt's grandson was present during this assessment, assisting with interpreting, as pt speaks [place] and very little [place]. He reports pt has a middle school education. Chart review indicates pt had difficulty with orientation to time prior to admit. Portions of the Mini-Mental State Examination were administered. Subtests not administered were attention/calculation, reading/writing due to language barrier. Pt demonstrated good immediate and delayed recall of 3 unrelated words. She was able to name objects, repeat phrases, and follow multi-step commands. Speech is fully intelligible. Pt's grandson reports that he has not observed any changes from her baseline level of function. At this time, no further ST intervention is recommended. Lucila Maine was  encouraged to talk with his aunt (with whom the patient lives) and notify physician if needs arise. ST signing off.    SLP Assessment  SLP Recommendation/Assessment: Patient does not need any further Speech Language Pathology Services    Recommendations for follow up therapy are one component of a multi-disciplinary discharge planning process, led by the attending physician.  Recommendations may be updated based on patient status, additional functional criteria and insurance authorization.    Follow Up Recommendations  No SLP follow up    Assistance Recommended at Discharge  None     SLP Evaluation Cognition  Overall Cognitive Status: History of cognitive impairments - at baseline Arousal/Alertness: Awake/alert Orientation Level: Oriented to person;Oriented to place;Disoriented to time;Disoriented to situation Attention: Focused;Sustained Focused Attention: Appears intact Sustained Attention: Appears intact Memory: Appears intact       Comprehension  Auditory Comprehension Overall Auditory Comprehension: Appears within functional limits for tasks assessed    Expression Expression Primary Mode of Expression: Verbal Verbal Expression Overall Verbal Expression: Appears within functional limits for tasks assessed   Oral / Motor  Oral Motor/Sensory Function Overall Oral Motor/Sensory Function: Within functional limits Motor Speech Overall Motor Speech: Appears within functional limits for tasks assessed            Jaliya Siegmann B. Murvin Natal, [facility], CCC-SLP Speech Language Pathologist Office: [phone]  Leigh Aurora 06/29/2021, 12:30 PM

## 2021-06-29 NOTE — TOC Progression Note (Signed)
Transition of Care [facility]) - Progression Note    Patient Details  Name: Kim [name] MRN: [id number] Date of Birth: 14-Jan-1946  [facility] CM/SW Contact  Kermit Balo, RN Phone Number: 06/29/2021, 3:17 PM  Clinical Narrative:    [facility] has offered the patient a bed for tomorrow admission. MD updated. Covid ordered.  CM will call daughter for update.    Expected Discharge Plan: IP Rehab Facility Barriers to Discharge: Continued Medical Work up  Expected Discharge Plan and Services Expected Discharge Plan: IP Rehab Facility   Discharge Planning Services: CM Consult Post Acute Care Choice: IP Rehab Living arrangements for the past 2 months: Single Family Home                                       Social Determinants of Health (SDOH) Interventions    Readmission Risk Interventions No flowsheet data found.

## 2021-06-29 NOTE — Progress Notes (Signed)
Stroke Team Progress Note  SUBJECTIVE Patient is sitting up in bed and her nurse is at the bedside.  She still complains of dizziness but it appears to be improving.  She has been evaluated by therapist recommend inpatient rehab.  2D echo shows normal ejection fraction without cardiac source of embolism.  Vital signs are stable.  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 CT angio of head and neck did not show any hemodynamically significant stenosis but showed a 2 mm size infundibulum from the origin of the right posterior communicating artery, possibly occluded.  It also showed multifocal bilateral small artery stenosis MRI brain showed an acute left superior cerebellar artery territory infarct with associated hemosiderin deposition, likely petechial hemorrhage and mild edema causing mild local mass-effect 2DEcho : Normal ejection fraction 60-65%. LDL cholesterol 73 mg percent Hemoglobin A1c 5.9 Physical Exam:     Well nourished, well developed, elderly [name] in no apparent distress.     Mental Status -  Level of arousal and orientation to time, place, and person were intact. Language was assessed and found intact. Attention span and concentration were normal. Recent and remote memory were intact   Cranial Nerves II - XII - II - Visual field intact OU. III, IV, VI - Extraocular movements intact.  Slow saccades on lateral gaze left greater than right.  No nystagmus V - Facial sensation intact bilaterally. VII - Facial movement intact bilaterally. VIII  - Hearing & vestibular intact bilaterally. XII - Tongue protrusion intact.   Motor Strength - The patients strength was normal in all extremities and pronator drift was absent.  Bulk was normal and fasciculations were absent.     Sensory - Light touch was assessed and was symmetrical.     Coordination - The patient had normal movements in the hands and feet with no ataxia or dysmetria.  Tremor was absent.   Gait and Station - deferred.  ASSESSMENT [name] is a 75 y.o. 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  Vascular risk factors of mild hyperlipidemia only.    Hospital day # 1  TREATMENT/PLAN  I have personally examined this patient, reviewed notes, independently viewed imaging studies, participated in medical decision making and plan of care.ROS completed by me personally and pertinent positives fully documented  I have made any additions or clarifications directly to the above note.  Recommend aspirin and Plavix for 3 days followed by aspirin alone. Crestor 20 mg daily for elevated lipids. Continue ongoing physical occupational therapy. Transferred to inpatient rehab when bed available in the next few days. Continue telemetry monitoring and loop recorder insertion on day of discharge to rehab. Discussed with Dr. Pola Corn I spent 25  minutes in total face-to-face time with the patient, more than 50% of which was spent in counseling and coordination of care, reviewing test results, reviewing medication and discussing or reviewing the diagnosis of cryptogenic stroke, the prognosis and treatment options.   Delia Heady, MD Medical Director [facility] Pager: [phone] 06/29/2021 2:58 PM

## 2021-06-29 NOTE — Plan of Care (Signed)
  Problem: Activity: Goal: Risk for activity intolerance will decrease Outcome: Progressing   Problem: Nutrition: Goal: Adequate nutrition will be maintained Outcome: Progressing   Problem: Pain Managment: Goal: General experience of comfort will improve Outcome: Progressing   Problem: Safety: Goal: Ability to remain free from injury will improve Outcome: Progressing   Problem: Skin Integrity: Goal: Risk for impaired skin integrity will decrease Outcome: Progressing   

## 2021-06-29 NOTE — Progress Notes (Signed)
Physical Therapy Treatment Patient Details Name: Kim [name] MRN: [id number] DOB: [birth year] Today's Date: 06/29/2021   History of Present Illness Pt is a 75 year old Punjabi-speaking female who presented to med [facility] ED with sudden onset of confusion and dizziness, transferred to [facility]. 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 Pt with past medical history of SLE (follows with [facility] rheumatology), hypertension, restless leg syndrome and osteoporosis    PT Comments    Pt progressing towards physical therapy goals. Was able to perform transfers and ambulation with gross min assist for balance support and safety with RW. Pt able to tolerate increased activity this session and reports minimal fatigue at end of session. Will continue to follow and progress as able per POC.     Recommendations for follow up therapy are one component of a multi-disciplinary discharge planning process, led by the attending physician.  Recommendations may be updated based on patient status, additional functional criteria and insurance authorization.  Follow Up Recommendations  Acute inpatient rehab (3hours/day) (Pt's daughter requesting AIR in [place] 2 closer proximity to family)     Assistance Recommended at Discharge Frequent or constant Supervision/Assistance  Equipment Recommendations  Rolling walker (2 wheels) (youth size)    Recommendations for Other Services       Precautions / Restrictions Precautions Precautions: Fall Restrictions Weight Bearing Restrictions: No     Mobility  Bed Mobility Overal bed mobility: Needs Assistance Bed Mobility: Supine to Sit     Supine to sit: Min guard     General bed mobility comments: Increased time and effort to complete. close guard to transition fully to EOB.    Transfers Overall transfer level: Needs assistance Equipment used: Rolling walker (2 wheels) Transfers: Sit to/from Stand Sit to Stand:  Min assist           General transfer comment: Assist for balance support and safety. Pt unsteady with transitioning hands from bed to walker or rails ini bathroom to walker.    Ambulation/Gait Ambulation/Gait assistance: Min assist Gait Distance (Feet): 200 Feet Assistive device: Rolling walker (2 wheels) Gait Pattern/deviations: Shuffle;Trunk flexed;Narrow base of support Gait velocity: Decreased Gait velocity interpretation: <1.31 ft/sec, indicative of household ambulator   General Gait Details: Slow and unsteady with frequent assist for balance support and walker management. Able to progress gait distance this session.   Stairs             Wheelchair Mobility    Modified Rankin (Stroke Patients Only) Modified Rankin (Stroke Patients Only) Pre-Morbid Rankin Score: No symptoms Modified Rankin: Moderately severe disability     Balance Overall balance assessment: Needs assistance Sitting-balance support: Feet supported Sitting balance-Leahy Scale: Fair     Standing balance support: No upper extremity supported;During functional activity Standing balance-Leahy Scale: Fair Standing balance comment: able to stand at the sink & wash face without UE support                            Cognition Arousal/Alertness: Awake/alert Behavior During Therapy: WFL for tasks assessed/performed Overall Cognitive Status: History of cognitive impairments - at baseline                                 General Comments: difficult to fully assess due to language barrier.        Exercises      General Comments  Pertinent Vitals/Pain Pain Assessment: No/denies pain    Home Living     Available Help at Discharge: Available PRN/intermittently Type of Home: House                  Prior Function            PT Goals (current goals can now be found in the care plan section) Acute Rehab PT Goals Patient Stated Goal: Pt did not state  goals. Pt's daughter's goal is for pt to be able to eventually return home. PT Goal Formulation: With family Time For Goal Achievement: 07/12/21 Potential to Achieve Goals: Good Progress towards PT goals: Progressing toward goals    Frequency    Min 4X/week      PT Plan Current plan remains appropriate    Co-evaluation              AM-PAC PT "6 Clicks" Mobility   Outcome Measure  Help needed turning from your back to your side while in a flat bed without using bedrails?: A Little Help needed moving from lying on your back to sitting on the side of a flat bed without using bedrails?: A Little Help needed moving to and from a bed to a chair (including a wheelchair)?: A Little Help needed standing up from a chair using your arms (e.g., wheelchair or bedside chair)?: A Little Help needed to walk in hospital room?: A Little Help needed climbing 3-5 steps with a railing? : Total 6 Click Score: 16    End of Session Equipment Utilized During Treatment: Gait belt Activity Tolerance: Patient tolerated treatment well Patient left: in bed;with call bell/phone within reach;with bed alarm set;with family/visitor present Nurse Communication: Mobility status PT Visit Diagnosis: Unsteadiness on feet (R26.81);Other symptoms and signs involving the nervous system (R29.898)     Time: 4854-6270 PT Time Calculation (min) (ACUTE ONLY): 30 min  Charges:  $Gait Training: 23-37 mins                     Conni Slipper, PT, DPT Acute Rehabilitation Services Pager: [phone] Office: [phone]    Marylynn Pearson 06/29/2021, 4:12 PM

## 2021-06-30 DIAGNOSIS — I639 Cerebral infarction, unspecified: Secondary | ICD-10-CM

## 2021-06-30 LAB — CBC WITH DIFFERENTIAL/PLATELET
Abs Immature Granulocytes: 0.02 10*3/uL (ref 0.00–0.07)
Basophils Absolute: 0 10*3/uL (ref 0.0–0.1)
Basophils Relative: 1 %
Eosinophils Absolute: 0.2 10*3/uL (ref 0.0–0.5)
Eosinophils Relative: 6 %
HCT: 31.1 % — ABNORMAL LOW (ref 36.0–46.0)
Hemoglobin: 10.8 g/dL — ABNORMAL LOW (ref 12.0–15.0)
Immature Granulocytes: 1 %
Lymphocytes Relative: 18 %
Lymphs Abs: 0.6 10*3/uL — ABNORMAL LOW (ref 0.7–4.0)
MCH: 32.3 pg (ref 26.0–34.0)
MCHC: 34.7 g/dL (ref 30.0–36.0)
MCV: 93.1 fL (ref 80.0–100.0)
Monocytes Absolute: 0.7 10*3/uL (ref 0.1–1.0)
Monocytes Relative: 21 %
Neutro Abs: 1.8 10*3/uL (ref 1.7–7.7)
Neutrophils Relative %: 53 %
Platelets: 249 10*3/uL (ref 150–400)
RBC: 3.34 MIL/uL — ABNORMAL LOW (ref 3.87–5.11)
RDW: 12.6 % (ref 11.5–15.5)
WBC: 3.3 10*3/uL — ABNORMAL LOW (ref 4.0–10.5)
nRBC: 0 % (ref 0.0–0.2)

## 2021-06-30 LAB — BASIC METABOLIC PANEL
Anion gap: 5 (ref 5–15)
BUN: 11 mg/dL (ref 8–23)
CO2: 24 mmol/L (ref 22–32)
Calcium: 8.5 mg/dL — ABNORMAL LOW (ref 8.9–10.3)
Chloride: 98 mmol/L (ref 98–111)
Creatinine, Ser: 0.72 mg/dL (ref 0.44–1.00)
GFR, Estimated: >60 mL/min (ref >60)
Glucose, Bld: 89 mg/dL (ref 70–99)
Potassium: 3.6 mmol/L (ref 3.5–5.1)
Sodium: 127 mmol/L — ABNORMAL LOW (ref 135–145)

## 2021-06-30 MED ORDER — SODIUM CHLORIDE 1 G PO TABS: 1.0000 g | ORAL_TABLET | Freq: Two times a day (BID) | ORAL | Status: AC

## 2021-06-30 MED ORDER — SODIUM CHLORIDE 1 G PO TABS
1.0000 g | ORAL_TABLET | Freq: Two times a day (BID) | ORAL | Status: DC
  Administered 2021-06-30: 09:00:00 1 g via ORAL
  Filled 2021-06-30 (×2): qty 1

## 2021-06-30 MED ORDER — ASPIRIN EC 81 MG PO TBEC: 81.0000 mg | DELAYED_RELEASE_TABLET | Freq: Every day | ORAL | 2 refills | Status: DC

## 2021-06-30 MED ORDER — CLOPIDOGREL BISULFATE 75 MG PO TABS: 75.0000 mg | ORAL_TABLET | Freq: Every day | ORAL | Status: AC

## 2021-06-30 MED ORDER — ACETAMINOPHEN 325 MG PO TABS
650.0000 mg | ORAL_TABLET | Freq: Four times a day (QID) | ORAL | Status: DC | PRN
Start: 2021-06-30 — End: 2021-11-29

## 2021-06-30 MED ORDER — POLYETHYLENE GLYCOL 3350 17 G PO PACK: 17.0000 g | PACK | Freq: Every day | ORAL | 0 refills | Status: AC | PRN

## 2021-06-30 NOTE — Progress Notes (Signed)
Physical Therapy Treatment Patient Details Name: Kim [name] MRN: [id number] DOB: 01-Nov-1945 Today's Date: 06/30/2021   History of Present Illness Pt is a 75 year old [place]jabi-speaking female who presented to med [facility] ED with sudden onset of confusion and dizziness, transferred to [facility]. 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 Pt with past medical history of SLE (follows with [facility] rheumatology), hypertension, restless leg syndrome and osteoporosis.    PT Comments    Pt progressing towards physical therapy goals. Was able to perform transfers and ambulation with gross min assist for balance support and safety. Pt appears fatigued today and overall gait speed was slower than it has been in previous sessions. Despite this, pt motivated to participate with PT and eager to sit OOB in chair. Will continue to follow.     Recommendations for follow up therapy are one component of a multi-disciplinary discharge planning process, led by the attending physician.  Recommendations may be updated based on patient status, additional functional criteria and insurance authorization.  Follow Up Recommendations  Acute inpatient rehab (3hours/day) (Pt's daughter requesting AIR in [place] 2 closer proximity to family)     Assistance Recommended at Discharge Frequent or constant Supervision/Assistance  Equipment Recommendations  Rolling walker (2 wheels) (youth size)    Recommendations for Other Services       Precautions / Restrictions Precautions Precautions: Fall Restrictions Weight Bearing Restrictions: No     Mobility  Bed Mobility Overal bed mobility: Needs Assistance Bed Mobility: Supine to Sit     Supine to sit: Min guard     General bed mobility comments: Increased time and effort to complete. close guard to transition fully to EOB.    Transfers Overall transfer level: Needs assistance Equipment used: Rolling walker (2  wheels) Transfers: Sit to/from Stand Sit to Stand: Min assist           General transfer comment: Assist for balance support and safety. Pt unsteady with transitioning hands from bed to walker or rails in bathroom to walker.    Ambulation/Gait Ambulation/Gait assistance: Min assist Gait Distance (Feet): 25 Feet Assistive device: Rolling walker (2 wheels) Gait Pattern/deviations: Shuffle;Trunk flexed;Narrow base of support Gait velocity: Decreased     General Gait Details: In room only due to fatigue. Occasional assist required for balance. Gait speed was especially slow this session and pt appeared fatigued. Reports her R side feels "heavy".   Stairs             Wheelchair Mobility    Modified Rankin (Stroke Patients Only) Modified Rankin (Stroke Patients Only) Pre-Morbid Rankin Score: No symptoms Modified Rankin: Moderately severe disability     Balance Overall balance assessment: Needs assistance Sitting-balance support: Feet supported Sitting balance-Leahy Scale: Fair     Standing balance support: No upper extremity supported;During functional activity Standing balance-Leahy Scale: Fair Standing balance comment: able to stand at the sink & wash face with intermittent single UE support                            Cognition Arousal/Alertness: Awake/alert Behavior During Therapy: WFL for tasks assessed/performed Overall Cognitive Status: History of cognitive impairments - at baseline                                 General Comments: difficult to fully assess due to language barrier.  Exercises      General Comments        Pertinent Vitals/Pain Pain Assessment: No/denies pain Faces Pain Scale: Hurts a little bit Pain Location: generalized with movement Pain Descriptors / Indicators: Discomfort;Grimacing Pain Intervention(s): Limited activity within patient's tolerance;Monitored during session;Repositioned    Home  Living                          Prior Function            PT Goals (current goals can now be found in the care plan section) Acute Rehab PT Goals Patient Stated Goal: Pt did not state goals. Pt's daughter's goal is for pt to be able to eventually return home. PT Goal Formulation: With family Time For Goal Achievement: 07/12/21 Potential to Achieve Goals: Good Progress towards PT goals: Progressing toward goals    Frequency    Min 4X/week      PT Plan Current plan remains appropriate    Co-evaluation              AM-PAC PT "6 Clicks" Mobility   Outcome Measure  Help needed turning from your back to your side while in a flat bed without using bedrails?: A Little Help needed moving from lying on your back to sitting on the side of a flat bed without using bedrails?: A Little Help needed moving to and from a bed to a chair (including a wheelchair)?: A Little Help needed standing up from a chair using your arms (e.g., wheelchair or bedside chair)?: A Little Help needed to walk in hospital room?: A Little Help needed climbing 3-5 steps with a railing? : Total 6 Click Score: 16    End of Session Equipment Utilized During Treatment: Gait belt Activity Tolerance: Patient tolerated treatment well Patient left: in bed;with call bell/phone within reach;with bed alarm set;with family/visitor present Nurse Communication: Mobility status PT Visit Diagnosis: Unsteadiness on feet (R26.81);Other symptoms and signs involving the nervous system (R29.898)     Time: 7673-4193 PT Time Calculation (min) (ACUTE ONLY): 25 min  Charges:  $Gait Training: 23-37 mins                     Kim [name], PT, DPT Acute Rehabilitation Services Pager: [phone] Office: [phone]    Kim [name] 06/30/2021, 2:40 PM

## 2021-06-30 NOTE — Progress Notes (Signed)
Stroke Team Progress Note  SUBJECTIVE Patient is sitting up in toilet chair and her nurse is at the bedside.   She has been accepted for inpatient rehab in [facility] today vital signs are stable.  Neurological exam is unchanged.  Vital signs stable   OBJECTIVE Most recent Vital Signs: Temp: 98.3 F (36.8 C) (12/21 1203) Temp Source: Oral (12/21 1203) BP: 121/75 (12/21 1203) Pulse Rate: 63 (12/21 1203) Respiratory Rate: 18 O2 Saturdation: 97%  CBG (last 3)  Recent Labs    06/29/21 0008 06/29/21 0620 06/29/21 1254  GLUCAP 101* 88 116*       Studies:  CT head did not show any acute intracranial process but showed old bilateral basal ganglia and lacunar infarcts. CT angio of head and neck did not show any hemodynamically significant stenosis but showed a 2 mm size infundibulum from the origin of the right posterior communicating artery, possibly occluded.  It also showed multifocal bilateral small artery stenosis MRI brain showed an acute left superior cerebellar artery territory infarct with associated hemosiderin deposition, likely petechial hemorrhage and mild edema causing mild local mass-effect 2DEcho : Normal ejection fraction 60-65%. LDL cholesterol 73 mg percent Hemoglobin A1c 5.9 Physical Exam:     Well nourished, well developed, elderly [name] in no apparent distress.     Mental Status -  Level of arousal and orientation to time, place, and person were intact. Language was assessed and found intact. Attention span and concentration were normal. Recent and remote memory were intact   Cranial Nerves II - XII - II - Visual field intact OU. III, IV, VI - Extraocular movements intact.  Slow saccades on lateral gaze left greater than right.  No nystagmus V - Facial sensation intact bilaterally. VII - Facial movement intact bilaterally. VIII - Hearing & vestibular intact bilaterally. XII - Tongue protrusion intact.   Motor Strength - The patients  strength was normal in all extremities and pronator drift was absent.  Bulk was normal and fasciculations were absent.     Sensory - Light touch was assessed and was symmetrical.     Coordination - The patient had normal movements in the hands and feet with no ataxia or dysmetria.  Tremor was absent.   Gait and Station - deferred.  ASSESSMENT [name] is a 75 y.o. 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  Vascular risk factors of mild hyperlipidemia only.    Hospital day # 2  TREATMENT/PLAN    Recommend aspirin and Plavix for 3 days followed by aspirin alone. Crestor 20 mg daily for elevated lipids. Continue ongoing physical occupational therapy. Transferred to inpatient rehab today   loop recorder insertion prior to discharge to rehab. Discussed with Dr. Pola Corn.  Follow-up as an outpatient stroke clinic in 2 months. I spent 25  minutes in total face-to-face time with the patient, more than 50% of which was spent in counseling and coordination of care, reviewing test results, reviewing medication and discussing or reviewing the diagnosis of cryptogenic stroke, the prognosis and treatment options.   Delia Heady, MD Medical Director [facility] Pager: [phone] 06/30/2021 2:12 PM

## 2021-06-30 NOTE — TOC Transition Note (Signed)
Transition of Care [facility]) - CM/SW Discharge Note   Patient Details  Name: Kim [name] MRN: [id number] Date of Birth: 08-06-1945  [facility] CM/SW Contact:  Kermit Balo, RN Phone Number: 06/30/2021, 10:18 AM   Clinical Narrative:    Patient is discharging to [facility] rehab today. She will transport via ambulance. Bedside RN updated and discharge packet is at the desk.   Number for report: [phone]   Final next level of care: IP Rehab Facility Barriers to Discharge: No Barriers Identified   Patient Goals and CMS Choice   CMS Medicare.gov Compare Post Acute Care list provided to:: Patient Represenative (must comment) Choice offered to / list presented to : Adult Children  Discharge Placement                       Discharge Plan and Services   Discharge Planning Services: CM Consult Post Acute Care Choice: IP Rehab                               Social Determinants of Health (SDOH) Interventions     Readmission Risk Interventions No flowsheet data found.

## 2021-06-30 NOTE — Progress Notes (Addendum)
Pt discharging to [facility] [place] inpatient rehab facility.  Copy of instructions printed and sent with transporters for facility.  Pt d/c'd with belongings, transported by [facility]. Picked up by [facility] at 1328.       Will be reaching out to [facility] rehab to give report at [phone] at this time of 1343.    Able to give report to nurse [facility].

## 2021-06-30 NOTE — Consult Note (Addendum)
ELECTROPHYSIOLOGY CONSULT NOTE  Patient ID: Kim [name] MRN: [id number], DOB/AGE: [birth year]   Admit date: 06/26/2021 Date of Consult: 06/30/2021  Primary Physician: Judd Lien, PA-C Primary Cardiologist: Dr. Judithe Modest, [facility] Reason for Consultation: Cryptogenic stroke; recommendations regarding Implantable Loop Recorder, requested by Dr. Pearlean Brownie  The pt speaks Punjabi, her daughter provides translation  History of Present Illness Kim [name] was admitted on 06/26/2021 with sudden onset dizziness, nausea and vomiting and lethargy, found with stroke  PMHx includes:of SLE, HTN, HLD, GERD, restless leg syndrome and osteoporosis   She follows with her cardiologist, Dr. 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  she has undergone workup for stroke including echocardiogram and carotid angio.  The patient has been monitored on telemetry which has demonstrated sinus rhythm with no arrhythmias.   Neurology has deferred TEE   Echocardiogram this admission demonstrated.   IMPRESSIONS   1. Left ventricular ejection fraction, by estimation, is 60 to 65%. Left  ventricular ejection fraction by 3D volume is 60 %. The left ventricle has  normal function. The left ventricle has no regional wall motion  abnormalities. There is moderate left  ventricular hypertrophy. Left ventricular diastolic parameters are  consistent with Grade I diastolic dysfunction (impaired relaxation).   2. Right ventricular systolic function is normal. The right ventricular  size is normal. Tricuspid regurgitation signal is inadequate for assessing  PA pressure.   3. The mitral valve is normal in structure. Trivial mitral valve  regurgitation.   4. The aortic valve was not well visualized.  Aortic valve regurgitation  is mild. No aortic stenosis is present.   5. Aortic dilatation noted. There is dilatation of the ascending aorta,  measuring 43 mm.   6. The inferior vena cava is normal in size with greater than 50%  respiratory variability, suggesting right atrial pressure of 3 mmHg.   Lab work is reviewed. Hyponatremia is deferred to her attending team    Prior to admission, the patient denies chest pain, shortness of breath, dizziness, palpitations, or syncope.  They are recovering from their stroke with plans to rehab at discharge.    Past Medical History:  Diagnosis Date   Arthritis    Depression    Essential hypertension 06/28/2021   Mixed hyperlipidemia 06/28/2021     Surgical History: History reviewed. No pertinent surgical history.   Medications Prior to Admission  Medication Sig Dispense Refill Last Dose   amLODipine (NORVASC) 5 MG tablet Take 5 mg by mouth daily.   06/26/2021   azaTHIOprine (IMURAN) 50 MG tablet Take 50 mg by mouth daily.   06/26/2021   DULoxetine (CYMBALTA) 20 MG capsule Take 20 mg by mouth daily.   06/26/2021   hydrALAZINE (APRESOLINE) 50 MG tablet Take 50 mg by mouth in the morning and at bedtime.   06/26/2021   hydroxychloroquine (PLAQUENIL) 200 MG tablet Take 200 mg by mouth daily.   06/26/2021   rOPINIRole (REQUIP) 1 MG tablet Take 1 mg by mouth at bedtime.   06/26/2021   rosuvastatin (CRESTOR) 20 MG tablet Take 20 mg by mouth daily.   06/26/2021   traMADol (ULTRAM) 50 MG tablet Take 50 mg by mouth 2 (two) times daily.   06/26/2021   valsartan (DIOVAN) 320 MG tablet Take 320 mg by mouth daily.  06/26/2021   Vitamin D, Ergocalciferol, (DRISDOL) 1.25 MG (50000 UNIT) CAPS capsule Take 50,000 Units by mouth every 7 (seven) days.   06/24/2021   acetaminophen (TYLENOL) 500 MG tablet Take 2 tablets (1,000 mg total) by mouth every 6 (six) hours as needed. (Patient not taking: Reported on 06/27/2021) 30 tablet 0 Not Taking    amoxicillin-clavulanate (AUGMENTIN) 875-125 MG tablet Take 1 tablet by mouth 2 (two) times daily. One po bid x 7 days (Patient not taking: Reported on 06/27/2021) 14 tablet 0 Not Taking   HYDROcodone-acetaminophen (NORCO/VICODIN) 5-325 MG per tablet Take 1 tablet by mouth every 4 (four) hours as needed for pain. (Patient not taking: Reported on 06/27/2021) 20 tablet 0 Not Taking   LORazepam (ATIVAN) 1 MG tablet Take 1 tablet (1 mg total) by mouth at bedtime as needed for anxiety. (Patient not taking: Reported on 06/27/2021) 10 tablet 0 Not Taking    Inpatient Medications:   amLODipine  5 mg Oral Daily   aspirin  325 mg Oral Daily   azaTHIOprine  50 mg Oral Daily   clopidogrel  75 mg Oral Daily   DULoxetine  20 mg Oral Daily   enoxaparin (LOVENOX) injection  40 mg Subcutaneous Q24H   hydrALAZINE  50 mg Oral BID   hydroxychloroquine  200 mg Oral Daily   irbesartan  300 mg Oral Daily   rOPINIRole  1 mg Oral QHS   rosuvastatin  20 mg Oral Daily   sodium chloride  1 g Oral BID WC   traMADol  50 mg Oral BID    Allergies:  Allergies  Allergen Reactions   Gabapentin Other (See Comments)    unknown unknown    Labetalol Other (See Comments)    Bradycardia   Lisinopril Other (See Comments)   Nebivolol Other (See Comments)    Bradycardia,    Social History   Socioeconomic History   Marital status: Married    Spouse name: Not on file   Number of children: Not on file   Years of education: Not on file   Highest education level: Not on file  Occupational History   Not on file  Tobacco Use   Smoking status: Never   Smokeless tobacco: Never  Vaping Use   Vaping Use: Never used  Substance and Sexual Activity   Alcohol use: No   Drug use: No   Sexual activity: Not on file  Other Topics Concern   Not on file  Social History Narrative   Not on file   Social Determinants of Health   Financial Resource Strain: Not on file  Food Insecurity: Not on file  Transportation Needs:  Not on file  Physical Activity: Not on file  Stress: Not on file  Social Connections: Not on file  Intimate Partner Violence: Not on file     Family History  Problem Relation Age of Onset   Heart disease Neg Hx       Review of Systems: All other systems reviewed and are otherwise negative except as noted above.  Physical Exam: Vitals:   06/29/21 2005 06/29/21 2355 06/30/21 0350 06/30/21 0858  BP: 135/82 (!) 152/93 115/72 (!) 141/77  Pulse: 80 64 (!) 58 66  Resp: 20 17 16 18   Temp: 97.6 F (36.4 C) 97.7 F (36.5 C) 98.1 F (36.7 C)   TempSrc: Oral Oral Oral   SpO2: 100% 100% 97% 97%  Weight:      Height:        GEN- The  patient is elderly, thin, somewhat chronically ill appearing   Head- normocephalic, atraumatic Eyes-  Sclera clear, conjunctiva pink Ears- hearing intact Oropharynx- clear Neck- supple Lungs- CTA b/l, normal work of breathing Heart- RRR, no murmurs, rubs or gallops  GI- soft, NT, ND Extremities- no clubbing, cyanosis, or edema MS- no significant deformity or atrophy Skin- no rash or lesion Psych- euthymic mood, full affect   Labs:   Lab Results  Component Value Date   WBC 3.3 (L) 06/30/2021   HGB 10.8 (L) 06/30/2021   HCT 31.1 (L) 06/30/2021   MCV 93.1 06/30/2021   PLT 249 06/30/2021    Recent Labs  Lab 06/28/21 0412 06/29/21 0259 06/30/21 0253  NA 133*   < > 127*  K 3.1*   < > 3.6  CL 101   < > 98  CO2 23   < > 24  BUN 15   < > 11  CREATININE 0.70   < > 0.72  CALCIUM 9.2   < > 8.5*  PROT 7.4  --   --   BILITOT 0.6  --   --   ALKPHOS 63  --   --   ALT 19  --   --   AST 32  --   --   GLUCOSE 83   < > 89   < > = values in this interval not displayed.   No results found for: CKTOTAL, CKMB, CKMBINDEX, TROPONINI Lab Results  Component Value Date   CHOL 143 06/28/2021   Lab Results  Component Value Date   HDL 62 06/28/2021   Lab Results  Component Value Date   LDLCALC 73 06/28/2021   Lab Results  Component Value Date    TRIG 40 06/28/2021   Lab Results  Component Value Date   CHOLHDL 2.3 06/28/2021   No results found for: LDLDIRECT  No results found for: DDIMER   Radiology/Studies:  CT Angio Head W or Wo Contrast Result Date: 06/26/2021 CLINICAL DATA:  Neuro deficit, stroke suspected, dizziness, confusion EXAM: CT ANGIOGRAPHY HEAD AND NECK TECHNIQUE: Multidetector CT imaging of the head and neck was performed using the standard protocol during bolus administration of intravenous contrast. Multiplanar CT image reconstructions and MIPs were obtained to evaluate the vascular anatomy. Carotid stenosis measurements (when applicable) are obtained utilizing NASCET criteria, using the distal internal carotid diameter as the denominator. CONTRAST:  165mL OMNIPAQUE IOHEXOL 350 MG/ML SOLN COMPARISON:  No prior CTA, correlation is made with CT head 06/26/2021 and 04/29/2020 FINDINGS: CT HEAD FINDINGS For noncontrast findings, please see same day CT head. CTA NECK FINDINGS Aortic arch: Standard branching. Imaged portion shows no evidence of aneurysm or dissection. No significant stenosis of the major arch vessel origins. Right carotid system: No evidence of dissection, stenosis (50% or greater) or occlusion. Calcified plaque at the bifurcation and in the proximal right ICA is not hemodynamically significant. Retropharyngeal course of the right ICA. Left carotid system: No evidence of dissection, stenosis (50% or greater) or occlusion. Calcifications at the bifurcation and in the proximal left ICA are not hemodynamically significant. 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 The bilateral extracranial vertebral arteries are patent, without significant stenosis, dissection, or occlusion. Skeleton: Straightening and mild reversal of the normal cervical lordosis. No acute osseous abnormality. Other neck: Negative. Upper  chest: Apical pleural-parenchymal scarring. No focal pulmonary opacity or pleural effusion. Review of  the MIP images confirms the above findings CTA HEAD FINDINGS Anterior circulation: Both internal carotid arteries are patent to the termini, with mild calcifications in the cavernous and supraclinoid segments that do not cause significant stenosis. Two mm posteriorly directed outpouching from the right ICA terminus (series 506, image 214), favored to represent an infundibulum from the origin of the right posterior communicating artery, which is not otherwise visualized, possibly occluded. A1 segments patent. Normal anterior communicating artery. Anterior cerebral arteries are patent to their distal aspects. No M1 stenosis or occlusion. Normal MCA bifurcations. Focal moderate calcified stenosis in a left M2 branch (series 506, image 205). Distal MCA branches otherwise perfused and symmetric. Posterior circulation: Vertebral arteries are patent to the vertebrobasilar junction, with mild calcified stenosis of the right V4, distal to the PICA takeoff. Posterior inferior cerebral arteries patent bilaterally. Basilar patent to its distal aspect. Superior cerebellar arteries patent bilaterally. Mild narrowing of the right P1 (series 506, image 195). Moderate narrowing of the proximal left P2. Poor opacification of the more inferior left P3. The bilateral posterior communicating arteries are not definitively visualized. Venous sinuses: As permitted by contrast timing, patent. Anatomic variants: None significant Review of the MIP images confirms the above findings IMPRESSION: 1. Two mm outpouching from the right ICA terminus, which is favored to represent an infundibulum from the origin of the right posterior communicating artery, which is not visualized, possibly occluded, versus a very small aneurysm. 2. Focal moderate calcified stenosis in a left M2 branch. 3. Moderate narrowing in the left P2, with poor opacification of  a left P3 branch. 4. Incidental note is made of duplicated right V1 segments, which merge at the level of C5-C6. 5.  No hemodynamically significant stenosis in the neck. Electronically Signed   By: Merilyn Baba M.D.   On: 06/26/2021 22:41    MR BRAIN WO CONTRAST Result Date: 06/28/2021 CLINICAL DATA:  Neuro deficit, stroke suspected EXAM: MRI HEAD WITHOUT CONTRAST TECHNIQUE: Multiplanar, multiecho pulse sequences of the brain and surrounding structures were obtained without intravenous contrast. COMPARISON:  No prior MRI, correlation is made with CT head 06/26/2021 FINDINGS: Brain: Restricted diffusion in the superomedial left cerebellum, which measures 2.4 x 2.7 x 1.3 cm (series 5, image 61 and series 7, image 45). This area is associated with increased T2 signal, likely mild edema, and hemosiderin deposition. This causes mild local mass effect without significant narrowing of the fourth ventricle. No midline shift. No hydrocephalus or extra-axial collection. T2 hyperintense signal in the periventricular white matter and pons, likely the sequela of moderate chronic small vessel ischemic disease. Lacunar infarct in the left greater than right basal ganglia and left corona radiata. Vascular: Normal flow voids. Skull and upper cervical spine: Normal marrow signal. Sinuses/Orbits: Negative. Other: None. IMPRESSION: Acute left SCA territory infarct, with associated with hemosiderin deposition, likely petechial hemorrhage, and mild edema, which causes mild local mass effect without significant narrowing of the fourth ventricle. These results were called by telephone at the time of interpretation on 06/28/2021 at 3:50 am to provider [facility] , who verbally acknowledged these results. Electronically Signed   By: Merilyn Baba M.D.   On: 06/28/2021 03:56   CT HEAD CODE STROKE WO CONTRAST Addendum Date: 06/26/2021   ADDENDUM REPORT: 06/26/2021 21:22 ADDENDUM: Code stroke imaging results were communicated on  06/26/2021 at 9:22 pm to provider Deno Etienne via telephone, who verbally acknowledged these results. Electronically Signed   By: Merilyn Baba M.D.   On: 06/26/2021 21:22  Result Date: 06/26/2021 CLINICAL DATA:  Code stroke.  Dizziness, confusion EXAM: CT HEAD WITHOUT CONTRAST TECHNIQUE: Contiguous axial images were obtained from the base of the skull through the vertex without intravenous contrast. COMPARISON:  04/29/2020 FINDINGS: Brain: No evidence of acute infarction, hemorrhage, cerebral edema, mass, mass effect, or midline shift. Ventricles and sulci are normal for age. No extra-axial fluid collection. Small low-density foci in the bilateral basal ganglia, likely lacunar infarcts, which are favored to be unchanged from the prior exam. Vascular: No hyperdense vessel or unexpected calcification. Skull: Normal. Negative for fracture or focal lesion. Sinuses/Orbits: No acute finding. Other: The mastoid air cells are well aerated. ASPECTS [facility] Stroke Program Early CT Score) - Ganglionic level infarction (caudate, lentiform nuclei, internal capsule, insula, M1-M3 cortex): 7 - Supraganglionic infarction (M4-M6 cortex): 3 Total score (0-10 with 10 being normal): 10 IMPRESSION: 1. No acute intracranial process. Small low-density foci in the bilateral basal ganglia, likely lacunar infarcts, which are favored to be unchanged from the prior exam 2. ASPECTS is 10 Code stroke imaging results were communicated on 06/26/2021 at 9:14 pm to provider Dr. Lorrin Goodell via secure text paging. Electronically Signed: By: Merilyn Baba M.D. On: 06/26/2021 21:15    12-lead ECG SR/SB All prior EKG's in EPIC reviewed with no documented atrial fibrillation  Telemetry SR  Assessment and Plan:  1. Cryptogenic stroke The patient presents with cryptogenic stroke.   I spoke at length with the patient's daughter today (Johal Paramjit) via telephone who translated via speaker phone with the patient about rational for monitoring  for afib with either a 30 day event monitor or an implantable loop recorder.  Risks, benefits, and alteratives to implantable loop recorder were discussed with the patient today.   At this time, the patient does not want to pursue any monitoring, they Tasnim Balentine follow up with Dr. Otho Perl (or [place]) out patient.    I have provided our office information in the AVS    Renee Dyane Dustman, PA-C 06/30/2021  I have seen and examined this patient with Tommye Standard.  Agree with above, note added to reflect my findings.  She presented to the hospital with dizziness, nausea, vomiting, lethargy, found to have cryptogenic stroke.  No cause has thus far been found.  GEN: Well nourished, well developed, in no acute distress  HEENT: normal  Neck: no JVD, carotid bruits, or masses Cardiac: RRR; no murmurs, rubs, or gallops,no edema  Respiratory:  clear to auscultation bilaterally, normal work of breathing GI: soft, nontender, nondistended, + BS MS: no deformity or atrophy  Skin: warm and dry Neuro:  Strength and sensation are intact Psych: euthymic mood, full affect   Cryptogenic stroke: No causes have been found.  Discussed with the patient further monitoring via Linq monitor.  She would like to avoid further procedures.  She Giavanni Odonovan follow-up with her primary cardiologist.  Tehila Sokolow M. Allysia Ingles MD 06/30/2021 2:48 PM

## 2021-06-30 NOTE — Discharge Summary (Signed)
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 female with PMH significant for SLE, HTN, HLD, GERD, restless leg syndrome, osteoporosis. Patient presented to [place] ED on 12/17 with complaint of sudden onset of confusion, dizziness, nausea, vomiting  In the ED, symptoms are concerning for posterior circulation stroke. Teleneurology was consulted. Patient was felt to be outside of tPA window. CT head did not show any acute intracranial process but showed old bilateral basal ganglia and lacunar infarcts. CT angio of head and neck did not show any hemodynamically significant stenosis but showed a 2 mm size infundibulum from the origin of the right posterior communicating artery, possibly occluded.  It also showed multifocal bilateral small artery stenosis Patient was given aspirin 325 mg, Plavix 300 mg and admitted for stroke work-up Admitted to hospitalist service on 12/18. MRI brain showed an acute left superior cerebellar artery territory infarct with associated hemosiderin deposition, likely petechial hemorrhage and mild edema causing mild local mass-effect Neurology consulted.  Subjective: Patient was seen and examined this morning.   Lying on bed.  Not in distress.  No new symptoms.  No family at  bedside.  Assessment/Plan: Acute stroke due to occlusion of left superior cerebellar artery Multifocal bilateral small intracranial arterial stenosis -Presented with acute onset of dizziness, confusion, vomiting, unsteady gait -Clinical concern of posterior circulation stroke -MRI brain showed left superior cerebellar artery territory infarct with petechial hemorrhage and some mass effect -Neurology consulted. -Stroke work-up completed -Echo with EF 60 to 65%, moderate LVH, grade 1 diastolic dysfunction.   -PT eval obtained.  Rehab recommended. -Continue neuro check and telemetry monitoring -PTA, patient was not on any antiplatelet/anticoagulant.  Per neurology recommendation, she has been started on aspirin and Plavix.  DAPT to continue for 3 weeks followed by aspirin alone. -Antiemetic for symptom control. -Patient was recommended loop recorder placement but family refused it for now.  Accelerated hypertension -Blood pressure improved after resumption of home meds.  Continue the same post discharge: amlodipine 5 mg daily, hydralazine 50 mg twice daily, valsartan 300 mg daily  Hyperlipidemia -Continue Crestor -Lipid panel 12/19 with HDL 62, LDL 73  Hyponatremia -Sodium level running down.  Probably because of low oral intake.  Not on diuretics.  Dietary sodium restriction removed.  Also started the patient on sodium chloride 1 g twice daily for next 3 days. -Repeat BMP in 3 days.  If continues, nephrology consultation as an outpatient. Recent Labs  Lab 06/26/21 2055 06/28/21 0032 06/28/21 0412 06/29/21 0259 06/30/21 0253  NA 130* 132* 133* 129* 127*   Systemic lupus erythematosus -Continue azathioprine and hydroxychloroquine.    Restless leg syndrome Continue ropinirole  Chronic arthritis -Continue Cymbalta and tramadol   Mobility: PT/OT/ST eval appreciated Living condition: Lives at home Goals of care:   Code Status: Full Code  Nutritional status: Body mass index is  23.03 kg/m.       Discharge Medications:   Allergies as of 06/30/2021       Reactions   Gabapentin Other (See Comments)   unknown unknown   Labetalol Other (See Comments)   Bradycardia   Lisinopril Other (See Comments)   Nebivolol Other (See Comments)   Bradycardia,        Medication List     STOP taking these medications    amoxicillin-clavulanate 875-125 MG tablet Commonly known as: Augmentin   HYDROcodone-acetaminophen 5-325 MG tablet Commonly known as: NORCO/VICODIN   LORazepam 1 MG tablet Commonly known as: Ativan       TAKE these medications    acetaminophen 325 MG tablet Commonly known as: TYLENOL Take 2 tablets (650 mg total) by mouth every 6 (six) hours as needed for mild pain (or Fever >/= 101). What changed:  medication strength how much to take reasons to take this   amLODipine 5 MG tablet Commonly known as: NORVASC Take 5 mg by mouth daily.   aspirin EC 81 MG tablet Take 1 tablet (81 mg total) by mouth daily. Swallow whole.   azaTHIOprine 50 MG tablet Commonly known as: IMURAN Take 50 mg by mouth daily.   clopidogrel 75 MG tablet Commonly known as: PLAVIX Take 1 tablet (75 mg total) by mouth daily for 21 days. Start taking on: July 01, 2021   DULoxetine 20 MG capsule Commonly known as: CYMBALTA Take 20 mg by mouth daily.   hydrALAZINE 50 MG tablet Commonly known as: APRESOLINE Take 50 mg by mouth in the morning and at bedtime.   hydroxychloroquine 200 MG tablet Commonly known as: PLAQUENIL Take 200 mg by mouth daily.   polyethylene glycol 17 g packet Commonly known as: MIRALAX / GLYCOLAX Take 17 g by mouth daily as needed for mild constipation.   rOPINIRole 1 MG tablet Commonly known as: REQUIP Take 1 mg by mouth at bedtime.   rosuvastatin 20 MG tablet Commonly known as: CRESTOR Take 20 mg by mouth daily.   sodium chloride 1 g tablet Take 1 tablet (1 g total) by mouth 2 (two) times daily with a meal for 3  days.   traMADol 50 MG tablet Commonly known as: ULTRAM Take 50 mg by mouth 2 (two) times daily.   valsartan 320 MG tablet Commonly known as: DIOVAN Take 320 mg by mouth daily.   Vitamin D (Ergocalciferol) 1.25 MG (50000 UNIT) Caps capsule Commonly known as: DRISDOL Take 50,000 Units by mouth every 7 (seven) days.        Wound care:     Discharge Instructions:   Discharge Instructions     Call MD for:  difficulty breathing, headache or visual disturbances   Complete by: As directed    Call MD for:  extreme fatigue   Complete by: As directed    Call MD for:  hives   Complete by: As directed    Call MD for:  persistant dizziness or light-headedness   Complete by: As directed    Call MD for:  persistant nausea and vomiting   Complete by: As directed    Call MD for:  severe uncontrolled pain   Complete by: As directed    Call MD for:  temperature >100.4   Complete by: As directed    Diet general   Complete by: As directed    Discharge instructions   Complete by: As directed    General discharge instructions:  Follow with Primary MD Diamantina Providence  H, PA-C in 7 days   Get CBC/BMP checked in next visit within 1 week by PCP or SNF MD. (We routinely change or add medications that can affect your baseline labs and fluid status, therefore we recommend that you get the mentioned basic workup next visit with your PCP, your PCP may decide not to get them or add new tests based on their clinical decision)  On your next visit with your PCP, please get your medicines reviewed and adjusted.  Please request your PCP  to go over all hospital tests, procedures, radiology results at the follow up, please get all Hospital records sent to your PCP by signing hospital release before you go home.  Activity: As tolerated with Full fall precautions use walker/cane & assistance as needed  Avoid using any recreational substances like cigarette, tobacco, alcohol, or non-prescribed drug.  If  you experience worsening of your admission symptoms, develop shortness of breath, life threatening emergency, suicidal or homicidal thoughts you must seek medical attention immediately by calling 911 or calling your MD immediately  if symptoms less severe.  You must read complete instructions/literature along with all the possible adverse reactions/side effects for all the medicines you take and that have been prescribed to you. Take any new medicine only after you have completely understood and accepted all the possible adverse reactions/side effects.   Do not drive, operate heavy machinery, perform activities at heights, swimming or participation in water activities or provide baby sitting services if your were admitted for syncope or siezures until you have seen by Primary MD or a Neurologist and advised to do so again.  Do not drive when taking Pain medications.  Do not take more than prescribed Pain, Sleep and Anxiety Medications  Wear Seat belts while driving.  Please note You were cared for by a hospitalist during your hospital stay. If you have any questions about your discharge medications or the care you received while you were in the hospital after you are discharged, you can call the unit and asked to speak with the hospitalist on call if the hospitalist that took care of you is not available. Once you are discharged, your primary care physician will handle any further medical issues. Please note that NO REFILLS for any discharge medications will be authorized once you are discharged, as it is imperative that you return to your primary care physician (or establish a relationship with a primary care physician if you do not have one) for your aftercare needs so that they can reassess your need for medications and monitor your lab values.   Increase activity slowly   Complete by: As directed        Follow ups:    Follow-up Information     Diamond NickelFolk, Thomas G., MD Follow up.   Specialty:  Cardiology Why: follow up with Dr. Judithe ModestFolk as discussed to discuss heart rhythm monitoring options/recommendations Contact information: [address] [place] [address] [phone]         Regan Lemmingamnitz, Will Martin, MD Follow up.   Specialty: Cardiology Why: cardiologist/team that saw you in the hospital Contact information: [address] [place] [address] [phone]         Irving CopasWebb, Meghan H, PA-C Follow up.   Specialty: Physician Assistant Contact information: [address] [place] [address] [phone]                 Discharge Exam:   Vitals:  06/29/21 2005 06/29/21 2355 06/30/21 0350 06/30/21 0858  BP: 135/82 (!) 152/93 115/72 (!) 141/77  Pulse: 80 64 (!) 58 66  Resp: 20 17 16 18   Temp: 97.6 F (36.4 C) 97.7 F (36.5 C) 98.1 F (36.7 C) 97.9 F (36.6 C)  TempSrc: Oral Oral Oral Oral  SpO2: 100% 100% 97% 97%  Weight:      Height:        Body mass index is 23.03 kg/m.  General exam: Pleasant, elderly female.  Not in physical distress Skin: No rashes, lesions or ulcers. HEENT: Atraumatic, normocephalic, no obvious bleeding Lungs: Clear to auscultation bilaterally CVS: Regular rate and rhythm, no murmur GI/Abd soft, nontender, nondistended, bowel sound present CNS: Alert, awake, knows she is in the hospital, not oriented to time which is her baseline per daughter Psychiatry: Sad affect Extremities: No pedal edema, no calf tenderness  Time coordinating discharge: 35 minutes   The results of significant diagnostics from this hospitalization (including imaging, microbiology, ancillary and laboratory) are listed below for reference.    Procedures and Diagnostic Studies:   CT Angio Head W or Wo Contrast  Result Date: 06/26/2021 CLINICAL DATA:  Neuro deficit, stroke suspected, dizziness, confusion EXAM: CT ANGIOGRAPHY HEAD AND NECK TECHNIQUE: Multidetector CT imaging of the head and neck was performed  using the standard protocol during bolus administration of intravenous contrast. Multiplanar CT image reconstructions and MIPs were obtained to evaluate the vascular anatomy. Carotid stenosis measurements (when applicable) are obtained utilizing NASCET criteria, using the distal internal carotid diameter as the denominator. CONTRAST:  174mL OMNIPAQUE IOHEXOL 350 MG/ML SOLN COMPARISON:  No prior CTA, correlation is made with CT head 06/26/2021 and 04/29/2020 FINDINGS: CT HEAD FINDINGS For noncontrast findings, please see same day CT head. CTA NECK FINDINGS Aortic arch: Standard branching. Imaged portion shows no evidence of aneurysm or dissection. No significant stenosis of the major arch vessel origins. Right carotid system: No evidence of dissection, stenosis (50% or greater) or occlusion. Calcified plaque at the bifurcation and in the proximal right ICA is not hemodynamically significant. Retropharyngeal course of the right ICA. Left carotid system: No evidence of dissection, stenosis (50% or greater) or occlusion. Calcifications at the bifurcation and in the proximal left ICA are not hemodynamically significant. 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 The bilateral extracranial vertebral arteries are patent, without significant stenosis, dissection, or occlusion. Skeleton: Straightening and mild reversal of the normal cervical lordosis. No acute osseous abnormality. Other neck: Negative. Upper chest: Apical pleural-parenchymal scarring. No focal pulmonary opacity or pleural effusion. Review of the MIP images confirms the above findings CTA HEAD FINDINGS Anterior circulation: Both internal carotid arteries are patent to the termini, with mild calcifications in the cavernous and supraclinoid segments that do not cause significant stenosis. Two mm posteriorly directed outpouching from the right ICA  terminus (series 506, image 214), favored to represent an infundibulum from the origin of the right posterior communicating artery, which is not otherwise visualized, possibly occluded. A1 segments patent. Normal anterior communicating artery. Anterior cerebral arteries are patent to their distal aspects. No M1 stenosis or occlusion. Normal MCA bifurcations. Focal moderate calcified stenosis in a left M2 branch (series 506, image 205). Distal MCA branches otherwise perfused and symmetric. Posterior circulation: Vertebral arteries are patent to the vertebrobasilar junction, with mild calcified stenosis of the right V4, distal to the PICA takeoff. Posterior inferior cerebral  arteries patent bilaterally. Basilar patent to its distal aspect. Superior cerebellar arteries patent bilaterally. Mild narrowing of the right P1 (series 506, image 195). Moderate narrowing of the proximal left P2. Poor opacification of the more inferior left P3. The bilateral posterior communicating arteries are not definitively visualized. Venous sinuses: As permitted by contrast timing, patent. Anatomic variants: None significant Review of the MIP images confirms the above findings IMPRESSION: 1. Two mm outpouching from the right ICA terminus, which is favored to represent an infundibulum from the origin of the right posterior communicating artery, which is not visualized, possibly occluded, versus a very small aneurysm. 2. Focal moderate calcified stenosis in a left M2 branch. 3. Moderate narrowing in the left P2, with poor opacification of a left P3 branch. 4. Incidental note is made of duplicated right V1 segments, which merge at the level of C5-C6. 5.  No hemodynamically significant stenosis in the neck. Electronically Signed   By: Wiliam KeAlison  Vasan M.D.   On: 06/26/2021 22:41   CT Angio Neck W and/or Wo Contrast  Result Date: 06/26/2021 CLINICAL DATA:  Neuro deficit, stroke suspected, dizziness, confusion EXAM: CT ANGIOGRAPHY HEAD AND  NECK TECHNIQUE: Multidetector CT imaging of the head and neck was performed using the standard protocol during bolus administration of intravenous contrast. Multiplanar CT image reconstructions and MIPs were obtained to evaluate the vascular anatomy. Carotid stenosis measurements (when applicable) are obtained utilizing NASCET criteria, using the distal internal carotid diameter as the denominator. CONTRAST:  100mL OMNIPAQUE IOHEXOL 350 MG/ML SOLN COMPARISON:  No prior CTA, correlation is made with CT head 06/26/2021 and 04/29/2020 FINDINGS: CT HEAD FINDINGS For noncontrast findings, please see same day CT head. CTA NECK FINDINGS Aortic arch: Standard branching. Imaged portion shows no evidence of aneurysm or dissection. No significant stenosis of the major arch vessel origins. Right carotid system: No evidence of dissection, stenosis (50% or greater) or occlusion. Calcified plaque at the bifurcation and in the proximal right ICA is not hemodynamically significant. Retropharyngeal course of the right ICA. Left carotid system: No evidence of dissection, stenosis (50% or greater) or occlusion. Calcifications at the bifurcation and in the proximal left ICA are not hemodynamically significant. 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 The bilateral extracranial vertebral arteries are patent, without significant stenosis, dissection, or occlusion. Skeleton: Straightening and mild reversal of the normal cervical lordosis. No acute osseous abnormality. Other neck: Negative. Upper chest: Apical pleural-parenchymal scarring. No focal pulmonary opacity or pleural effusion. Review of the MIP images confirms the above findings CTA HEAD FINDINGS Anterior circulation: Both internal carotid arteries are patent to the termini, with mild calcifications in the cavernous and supraclinoid segments that do not cause  significant stenosis. Two mm posteriorly directed outpouching from the right ICA terminus (series 506, image 214), favored to represent an infundibulum from the origin of the right posterior communicating artery, which is not otherwise visualized, possibly occluded. A1 segments patent. Normal anterior communicating artery. Anterior cerebral arteries are patent to their distal aspects. No M1 stenosis or occlusion. Normal MCA bifurcations. Focal moderate calcified stenosis in a left M2 branch (series 506, image 205). Distal MCA branches otherwise perfused and symmetric. Posterior circulation: Vertebral arteries are patent to the vertebrobasilar junction, with mild calcified stenosis of the right V4, distal to the PICA takeoff. Posterior inferior cerebral arteries patent bilaterally. Basilar patent to its distal aspect. Superior cerebellar arteries patent bilaterally.  Mild narrowing of the right P1 (series 506, image 195). Moderate narrowing of the proximal left P2. Poor opacification of the more inferior left P3. The bilateral posterior communicating arteries are not definitively visualized. Venous sinuses: As permitted by contrast timing, patent. Anatomic variants: None significant Review of the MIP images confirms the above findings IMPRESSION: 1. Two mm outpouching from the right ICA terminus, which is favored to represent an infundibulum from the origin of the right posterior communicating artery, which is not visualized, possibly occluded, versus a very small aneurysm. 2. Focal moderate calcified stenosis in a left M2 branch. 3. Moderate narrowing in the left P2, with poor opacification of a left P3 branch. 4. Incidental note is made of duplicated right V1 segments, which merge at the level of C5-C6. 5.  No hemodynamically significant stenosis in the neck. Electronically Signed   By: Merilyn Baba M.D.   On: 06/26/2021 22:41   DG Chest Port 1 View  Result Date: 06/26/2021 CLINICAL DATA:  Dizziness with  altered mental status. EXAM: PORTABLE CHEST 1 VIEW COMPARISON:  None. FINDINGS: There is no evidence of acute infiltrate, pleural effusion or pneumothorax. The cardiac silhouette is mildly enlarged. There is marked severity tortuosity of the descending thoracic aorta. Degenerative changes are seen throughout the thoracic spine. IMPRESSION: Mild cardiomegaly, without evidence of acute cardiopulmonary disease. Electronically Signed   By: Virgina Norfolk M.D.   On: 06/26/2021 23:24   CT HEAD CODE STROKE WO CONTRAST  Addendum Date: 06/26/2021   ADDENDUM REPORT: 06/26/2021 21:22 ADDENDUM: Code stroke imaging results were communicated on 06/26/2021 at 9:22 pm to provider Deno Etienne via telephone, who verbally acknowledged these results. Electronically Signed   By: Merilyn Baba M.D.   On: 06/26/2021 21:22   Result Date: 06/26/2021 CLINICAL DATA:  Code stroke.  Dizziness, confusion EXAM: CT HEAD WITHOUT CONTRAST TECHNIQUE: Contiguous axial images were obtained from the base of the skull through the vertex without intravenous contrast. COMPARISON:  04/29/2020 FINDINGS: Brain: No evidence of acute infarction, hemorrhage, cerebral edema, mass, mass effect, or midline shift. Ventricles and sulci are normal for age. No extra-axial fluid collection. Small low-density foci in the bilateral basal ganglia, likely lacunar infarcts, which are favored to be unchanged from the prior exam. Vascular: No hyperdense vessel or unexpected calcification. Skull: Normal. Negative for fracture or focal lesion. Sinuses/Orbits: No acute finding. Other: The mastoid air cells are well aerated. ASPECTS [facility] Stroke Program Early CT Score) - Ganglionic level infarction (caudate, lentiform nuclei, internal capsule, insula, M1-M3 cortex): 7 - Supraganglionic infarction (M4-M6 cortex): 3 Total score (0-10 with 10 being normal): 10 IMPRESSION: 1. No acute intracranial process. Small low-density foci in the bilateral basal ganglia, likely  lacunar infarcts, which are favored to be unchanged from the prior exam 2. ASPECTS is 10 Code stroke imaging results were communicated on 06/26/2021 at 9:14 pm to provider Dr. Lorrin Goodell via secure text paging. Electronically Signed: By: Merilyn Baba M.D. On: 06/26/2021 21:15     Labs:   Basic Metabolic Panel: Recent Labs  Lab 06/26/21 2055 06/28/21 0032 06/28/21 0412 06/29/21 0259 06/30/21 0253  NA 130* 132* 133* 129* 127*  K 2.9* 4.8 3.1* 3.7 3.6  CL 97* 101 101 97* 98  CO2 18* 20* 23 24 24   GLUCOSE 219* 80 83 96 89  BUN 18 14 15 15 11   CREATININE 0.65 0.78 0.70 0.66 0.72  CALCIUM 9.5 9.2 9.2 8.8* 8.5*  MG  --  2.2 1.9  --   --  GFR Estimated Creatinine Clearance: 43 mL/min (by C-G formula based on SCr of 0.72 mg/dL). Liver Function Tests: Recent Labs  Lab 06/26/21 2055 06/28/21 0032 06/28/21 0412  AST 32 59* 32  ALT 21 25 19   ALKPHOS 70 73 63  BILITOT 0.5 1.8* 0.6  PROT 8.6* 7.8 7.4  ALBUMIN 4.5 3.9 3.6   No results for input(s): LIPASE, AMYLASE in the last 168 hours. No results for input(s): AMMONIA in the last 168 hours. Coagulation profile Recent Labs  Lab 06/26/21 2055  INR 1.0    CBC: Recent Labs  Lab 06/26/21 2055 06/28/21 0032 06/28/21 0412 06/29/21 0259 06/30/21 0253  WBC 7.6 4.6 5.2 4.4 3.3*  NEUTROABS 6.1 3.2 3.9 2.8 1.8  HGB 11.8* 13.1 11.6* 11.0* 10.8*  HCT 33.9* 37.9 34.5* 32.5* 31.1*  MCV 92.4 92.4 94.0 93.1 93.1  PLT 310 271 284 264 249   Cardiac Enzymes: No results for input(s): CKTOTAL, CKMB, CKMBINDEX, TROPONINI in the last 168 hours. BNP: Invalid input(s): POCBNP CBG: Recent Labs  Lab 06/28/21 1636 06/28/21 2141 06/29/21 0008 06/29/21 0620 06/29/21 1254  GLUCAP 106* 106* 101* 88 116*   D-Dimer No results for input(s): DDIMER in the last 72 hours. Hgb A1c Recent Labs    06/28/21 0032  HGBA1C 5.9*   Lipid Profile Recent Labs    06/28/21 0032  CHOL 143  HDL 62  LDLCALC 73  TRIG 40  CHOLHDL 2.3    Thyroid function studies No results for input(s): TSH, T4TOTAL, T3FREE, THYROIDAB in the last 72 hours.  Invalid input(s): FREET3 Anemia work up No results for input(s): VITAMINB12, FOLATE, FERRITIN, TIBC, IRON, RETICCTPCT in the last 72 hours. Microbiology Recent Results (from the past 240 hour(s))  Resp Panel by RT-PCR (Flu A&B, Covid) Nasopharyngeal Swab     Status: None   Collection Time: 06/26/21  9:30 PM   Specimen: Nasopharyngeal Swab; Nasopharyngeal(NP) swabs in vial transport medium  Result Value Ref Range Status   SARS Coronavirus 2 by RT PCR NEGATIVE NEGATIVE Final    Comment: (NOTE) SARS-CoV-2 target nucleic acids are NOT DETECTED.  The SARS-CoV-2 RNA is generally detectable in upper respiratory specimens during the acute phase of infection. The lowest concentration of SARS-CoV-2 viral copies this assay can detect is 138 copies/mL. A negative result does not preclude SARS-Cov-2 infection and should not be used as the sole basis for treatment or other patient management decisions. A negative result may occur with  improper specimen collection/handling, submission of specimen other than nasopharyngeal swab, presence of viral mutation(s) within the areas targeted by this assay, and inadequate number of viral copies(<138 copies/mL). A negative result must be combined with clinical observations, patient history, and epidemiological information. The expected result is Negative.  Fact Sheet for Patients:  EntrepreneurPulse.com.au  Fact Sheet for Healthcare Providers:  IncredibleEmployment.be  This test is no t yet approved or cleared by the [place] FDA and  has been authorized for detection and/or diagnosis of SARS-CoV-2 by FDA under an Emergency Use Authorization (EUA). This EUA will remain  in effect (meaning this test can be used) for the duration of the COVID-19 declaration under Section 564(b)(1) of the Act,  21 U.S.C.section 360bbb-3(b)(1), unless the authorization is terminated  or revoked sooner.       Influenza A by PCR NEGATIVE NEGATIVE Final   Influenza B by PCR NEGATIVE NEGATIVE Final    Comment: (NOTE) The Xpert Xpress SARS-CoV-2/FLU/RSV plus assay is intended as an aid in the diagnosis of influenza from  Nasopharyngeal swab specimens and should not be used as a sole basis for treatment. Nasal washings and aspirates are unacceptable for Xpert Xpress SARS-CoV-2/FLU/RSV testing.  Fact Sheet for Patients: BloggerCourse.com  Fact Sheet for Healthcare Providers: SeriousBroker.it  This test is not yet approved or cleared by the [place] FDA and has been authorized for detection and/or diagnosis of SARS-CoV-2 by FDA under an Emergency Use Authorization (EUA). This EUA will remain in effect (meaning this test can be used) for the duration of the COVID-19 declaration under Section 564(b)(1) of the Act, 21 U.S.C. section 360bbb-3(b)(1), unless the authorization is terminated or revoked.  Performed at [facility], [address] [place], [place] [address]   Resp Panel by RT-PCR (Flu A&B, Covid) Nasopharyngeal Swab     Status: None   Collection Time: 06/29/21  3:17 PM   Specimen: Nasopharyngeal Swab; Nasopharyngeal(NP) swabs in vial transport medium  Result Value Ref Range Status   SARS Coronavirus 2 by RT PCR NEGATIVE NEGATIVE Final    Comment: (NOTE) SARS-CoV-2 target nucleic acids are NOT DETECTED.  The SARS-CoV-2 RNA is generally detectable in upper respiratory specimens during the acute phase of infection. The lowest concentration of SARS-CoV-2 viral copies this assay can detect is 138 copies/mL. A negative result does not preclude SARS-Cov-2 infection and should not be used as the sole basis for treatment or other patient management decisions. A negative result may occur with  improper specimen  collection/handling, submission of specimen other than nasopharyngeal swab, presence of viral mutation(s) within the areas targeted by this assay, and inadequate number of viral copies(<138 copies/mL). A negative result must be combined with clinical observations, patient history, and epidemiological information. The expected result is Negative.  Fact Sheet for Patients:  BloggerCourse.com  Fact Sheet for Healthcare Providers:  SeriousBroker.it  This test is no t yet approved or cleared by the [place] FDA and  has been authorized for detection and/or diagnosis of SARS-CoV-2 by FDA under an Emergency Use Authorization (EUA). This EUA will remain  in effect (meaning this test can be used) for the duration of the COVID-19 declaration under Section 564(b)(1) of the Act, 21 U.S.C.section 360bbb-3(b)(1), unless the authorization is terminated  or revoked sooner.       Influenza A by PCR NEGATIVE NEGATIVE Final   Influenza B by PCR NEGATIVE NEGATIVE Final    Comment: (NOTE) The Xpert Xpress SARS-CoV-2/FLU/RSV plus assay is intended as an aid in the diagnosis of influenza from Nasopharyngeal swab specimens and should not be used as a sole basis for treatment. Nasal washings and aspirates are unacceptable for Xpert Xpress SARS-CoV-2/FLU/RSV testing.  Fact Sheet for Patients: BloggerCourse.com  Fact Sheet for Healthcare Providers: SeriousBroker.it  This test is not yet approved or cleared by the [place] FDA and has been authorized for detection and/or diagnosis of SARS-CoV-2 by FDA under an Emergency Use Authorization (EUA). This EUA will remain in effect (meaning this test can be used) for the duration of the COVID-19 declaration under Section 564(b)(1) of the Act, 21 U.S.C. section 360bbb-3(b)(1), unless the authorization is terminated or revoked.  Performed at [facility], [address], [place], [place] [address]      Signed: Lorin Glass  [facility] 06/30/2021, 11:46 AM

## 2021-07-30 ENCOUNTER — Emergency Department (HOSPITAL_COMMUNITY): Payer: Medicare Other

## 2021-07-30 ENCOUNTER — Other Ambulatory Visit: Payer: Self-pay

## 2021-07-30 ENCOUNTER — Inpatient Hospital Stay (HOSPITAL_COMMUNITY)
Admission: EM | Admit: 2021-07-30 | Discharge: 2021-08-02 | DRG: 041 | Disposition: A | Discharge status: Home Health | Payer: Medicare Other | Attending: Family Medicine | Admitting: Family Medicine

## 2021-07-30 DIAGNOSIS — G459 Transient cerebral ischemic attack, unspecified: Secondary | ICD-10-CM | POA: Diagnosis not present

## 2021-07-30 DIAGNOSIS — R29700 NIHSS score 0: Secondary | ICD-10-CM | POA: Diagnosis present

## 2021-07-30 DIAGNOSIS — I77810 Thoracic aortic ectasia: Secondary | ICD-10-CM | POA: Diagnosis present

## 2021-07-30 DIAGNOSIS — I63412 Cerebral infarction due to embolism of left middle cerebral artery: Secondary | ICD-10-CM | POA: Diagnosis not present

## 2021-07-30 DIAGNOSIS — Z7982 Long term (current) use of aspirin: Secondary | ICD-10-CM

## 2021-07-30 DIAGNOSIS — Z20822 Contact with and (suspected) exposure to covid-19: Secondary | ICD-10-CM | POA: Diagnosis present

## 2021-07-30 DIAGNOSIS — Z888 Allergy status to other drugs, medicaments and biological substances status: Secondary | ICD-10-CM

## 2021-07-30 DIAGNOSIS — R29810 Facial weakness: Secondary | ICD-10-CM | POA: Diagnosis present

## 2021-07-30 DIAGNOSIS — Z79899 Other long term (current) drug therapy: Secondary | ICD-10-CM

## 2021-07-30 DIAGNOSIS — I10 Essential (primary) hypertension: Secondary | ICD-10-CM | POA: Diagnosis not present

## 2021-07-30 DIAGNOSIS — G2581 Restless legs syndrome: Secondary | ICD-10-CM

## 2021-07-30 DIAGNOSIS — K219 Gastro-esophageal reflux disease without esophagitis: Secondary | ICD-10-CM | POA: Diagnosis present

## 2021-07-30 DIAGNOSIS — M810 Age-related osteoporosis without current pathological fracture: Secondary | ICD-10-CM | POA: Diagnosis present

## 2021-07-30 DIAGNOSIS — E782 Mixed hyperlipidemia: Secondary | ICD-10-CM | POA: Diagnosis not present

## 2021-07-30 DIAGNOSIS — D638 Anemia in other chronic diseases classified elsewhere: Secondary | ICD-10-CM | POA: Diagnosis present

## 2021-07-30 DIAGNOSIS — G8321 Monoplegia of upper limb affecting right dominant side: Secondary | ICD-10-CM | POA: Diagnosis present

## 2021-07-30 DIAGNOSIS — E871 Hypo-osmolality and hyponatremia: Secondary | ICD-10-CM | POA: Diagnosis present

## 2021-07-30 DIAGNOSIS — F32A Depression, unspecified: Secondary | ICD-10-CM | POA: Diagnosis present

## 2021-07-30 DIAGNOSIS — Z79891 Long term (current) use of opiate analgesic: Secondary | ICD-10-CM

## 2021-07-30 DIAGNOSIS — E876 Hypokalemia: Secondary | ICD-10-CM | POA: Diagnosis present

## 2021-07-30 DIAGNOSIS — M329 Systemic lupus erythematosus, unspecified: Secondary | ICD-10-CM

## 2021-07-30 DIAGNOSIS — R471 Dysarthria and anarthria: Secondary | ICD-10-CM | POA: Diagnosis present

## 2021-07-30 DIAGNOSIS — Q2112 Patent foramen ovale: Secondary | ICD-10-CM

## 2021-07-30 DIAGNOSIS — Z8673 Personal history of transient ischemic attack (TIA), and cerebral infarction without residual deficits: Secondary | ICD-10-CM

## 2021-07-30 DIAGNOSIS — M1990 Unspecified osteoarthritis, unspecified site: Secondary | ICD-10-CM | POA: Diagnosis present

## 2021-07-30 LAB — CBG MONITORING, ED: Glucose-Capillary: 114 mg/dL — ABNORMAL HIGH (ref 70–99)

## 2021-07-30 LAB — COMPREHENSIVE METABOLIC PANEL
ALT: 14 U/L (ref 0–44)
AST: 21 U/L (ref 15–41)
Albumin: 3.7 g/dL (ref 3.5–5.0)
Alkaline Phosphatase: 65 U/L (ref 38–126)
Anion gap: 6 (ref 5–15)
BUN: 12 mg/dL (ref 8–23)
CO2: 25 mmol/L (ref 22–32)
Calcium: 9 mg/dL (ref 8.9–10.3)
Chloride: 103 mmol/L (ref 98–111)
Creatinine, Ser: 0.65 mg/dL (ref 0.44–1.00)
GFR, Estimated: >60 mL/min (ref >60)
Glucose, Bld: 120 mg/dL — ABNORMAL HIGH (ref 70–99)
Potassium: 3.7 mmol/L (ref 3.5–5.1)
Sodium: 134 mmol/L — ABNORMAL LOW (ref 135–145)
Total Bilirubin: 0.4 mg/dL (ref 0.3–1.2)
Total Protein: 7.4 g/dL (ref 6.5–8.1)

## 2021-07-30 LAB — CBC WITH DIFFERENTIAL/PLATELET
Abs Immature Granulocytes: 0.01 10*3/uL (ref 0.00–0.07)
Basophils Absolute: 0 10*3/uL (ref 0.0–0.1)
Basophils Relative: 1 %
Eosinophils Absolute: 0.1 10*3/uL (ref 0.0–0.5)
Eosinophils Relative: 4 %
HCT: 35.5 % — ABNORMAL LOW (ref 36.0–46.0)
Hemoglobin: 11.7 g/dL — ABNORMAL LOW (ref 12.0–15.0)
Immature Granulocytes: 0 %
Lymphocytes Relative: 19 %
Lymphs Abs: 0.7 10*3/uL (ref 0.7–4.0)
MCH: 32.1 pg (ref 26.0–34.0)
MCHC: 33 g/dL (ref 30.0–36.0)
MCV: 97.5 fL (ref 80.0–100.0)
Monocytes Absolute: 0.6 10*3/uL (ref 0.1–1.0)
Monocytes Relative: 16 %
Neutro Abs: 2.1 10*3/uL (ref 1.7–7.7)
Neutrophils Relative %: 60 %
Platelets: 344 10*3/uL (ref 150–400)
RBC: 3.64 MIL/uL — ABNORMAL LOW (ref 3.87–5.11)
RDW: 13.2 % (ref 11.5–15.5)
WBC: 3.5 10*3/uL — ABNORMAL LOW (ref 4.0–10.5)
nRBC: 0 % (ref 0.0–0.2)

## 2021-07-30 LAB — RESP PANEL BY RT-PCR (FLU A&B, COVID) ARPGX2
Influenza A by PCR: NEGATIVE
Influenza B by PCR: NEGATIVE
SARS Coronavirus 2 by RT PCR: NEGATIVE

## 2021-07-30 IMAGING — DX DG CHEST 1V PORT
1 series · 1 of 1 positions shown · non-contrast
Comparison: Chest x-ray dated [DATE]

CLINICAL DATA: Concern for pneumonia

EXAM:
PORTABLE CHEST 1 VIEW

[chest ap]
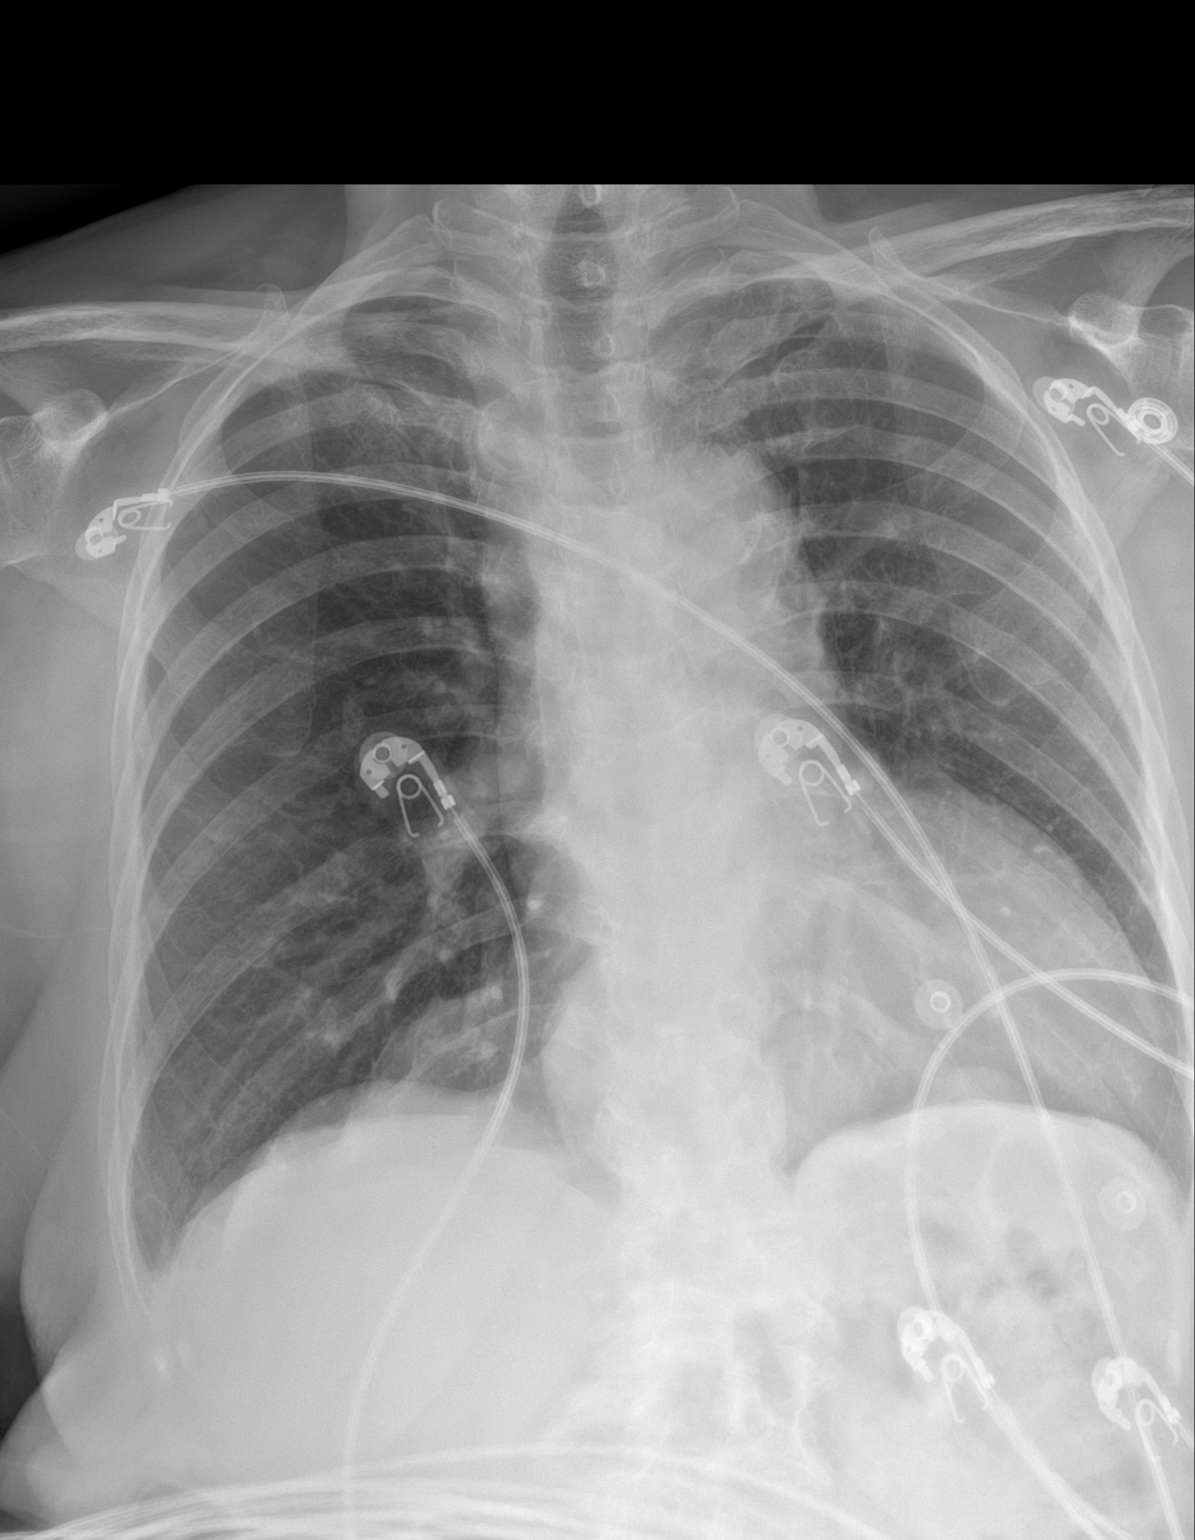

[1 of 1 positions shown; findings below may reference images not displayed]

FINDINGS: Cardiac and mediastinal contours are unchanged. Small hiatal hernia.
Eventration of the diaphragm. Opacity of the right costophrenic
angle is compatible with fat containing Morgagni hernia when
compared with prior CT. Visualized lungs are clear, left
costophrenic angle excluded from the field of view. No large pleural
effusion or pneumothorax.
IMPRESSION: No active disease.

## 2021-07-30 IMAGING — CT CT HEAD W/O CM
4 of 5 series · 15 of 47 positions shown, 17 images · non-contrast
Comparison: [DATE]

CLINICAL DATA: TIA.  Facial droop.



[Series 3: head without · axial · non-contrast · 0.43mm/px · z∈[+40,+140]mm · 5 of 30 slices shown, 7 images]
[im 5/30  brain]
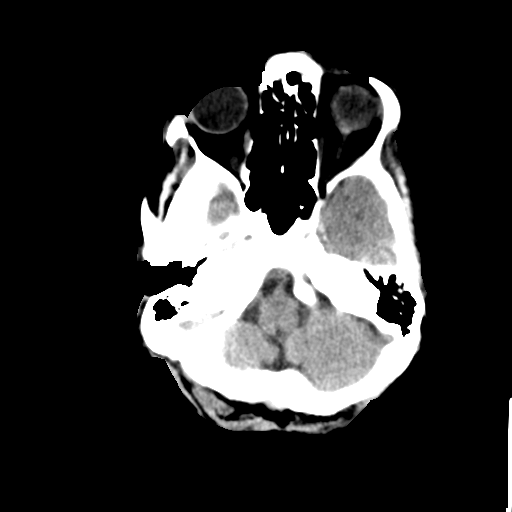
[im 5/30  bone]
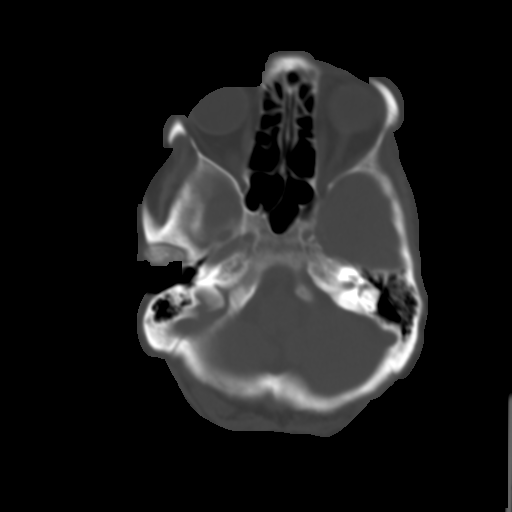
[im 10/30  brain]
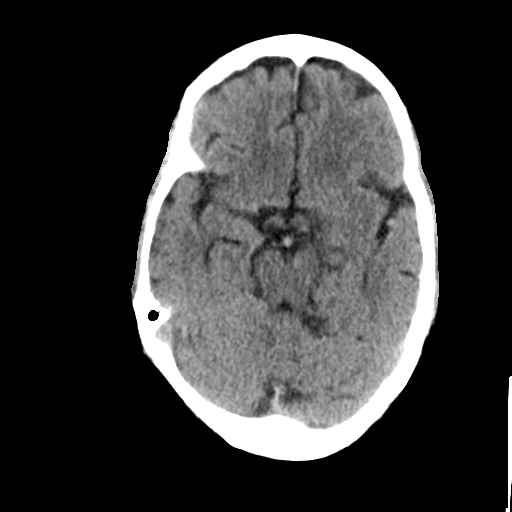
[im 15/30  brain]
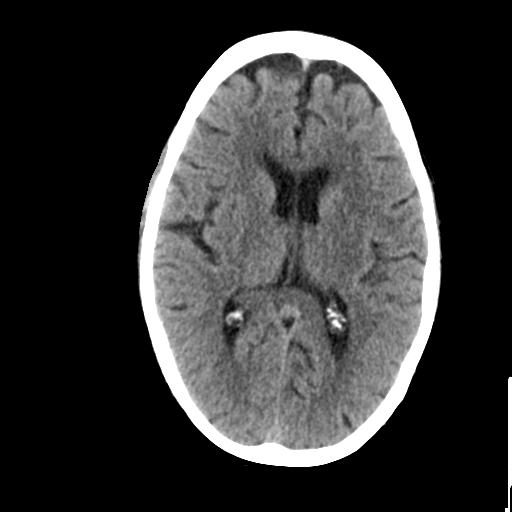
[im 20/30  brain]
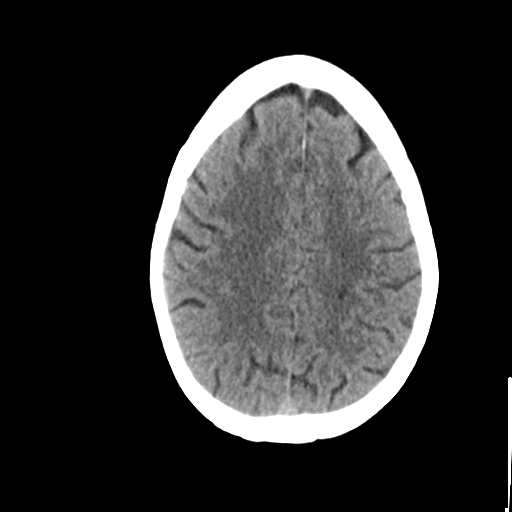
[im 25/30  brain]
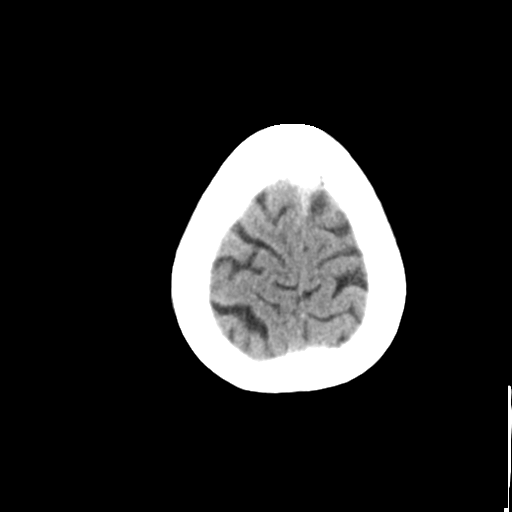
[im 25/30  bone]
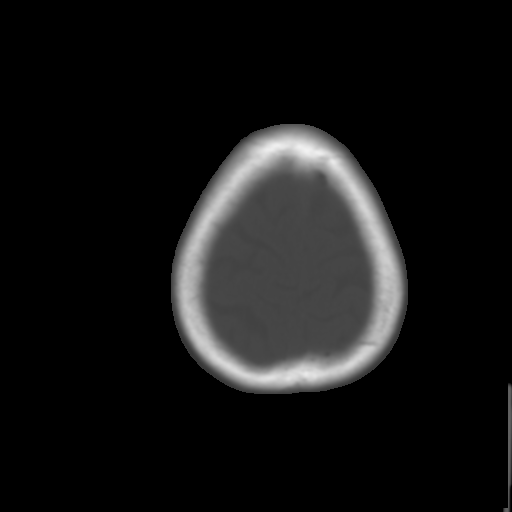

[Series 5: head without cor · coronal · non-contrast · 0.30mm/px · 3 of 73 slices shown]
[im 25/73  brain]
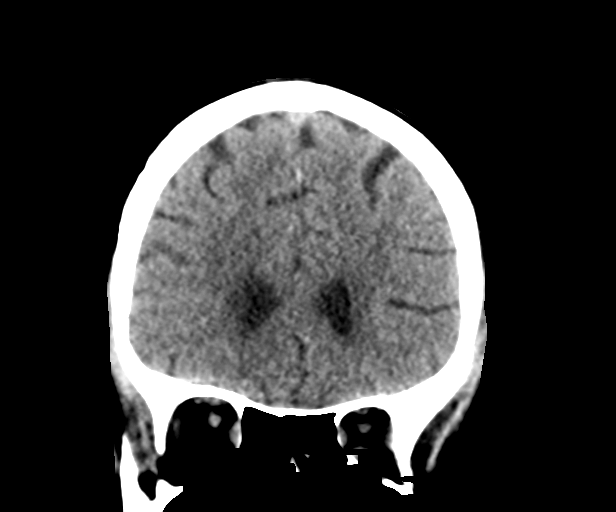
[im 33/73  brain]
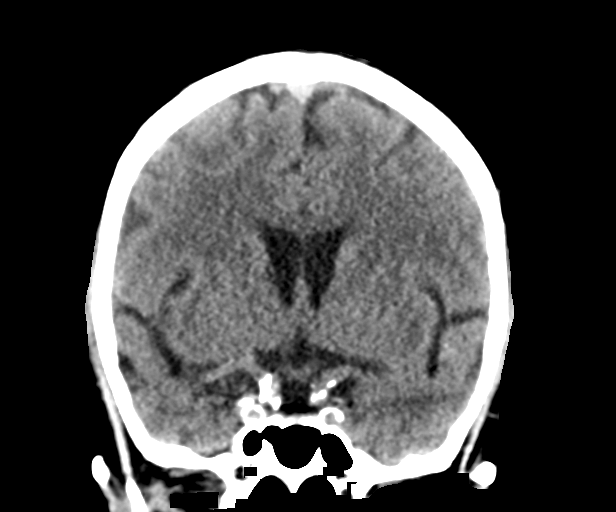
[im 41/73  brain]
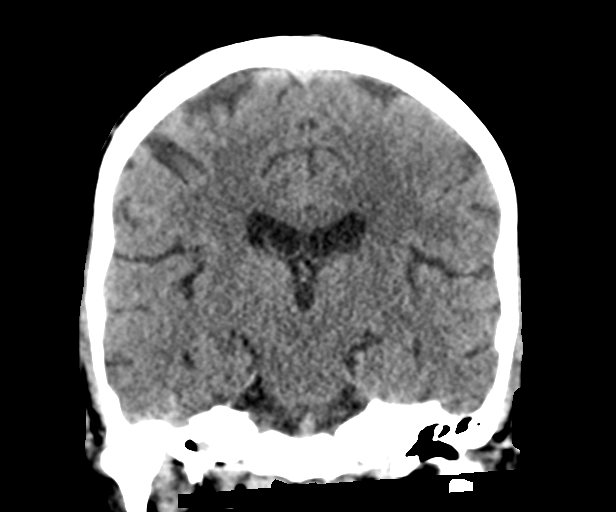

[Series 6: head without sag · sagittal · non-contrast · 0.33mm/px · 3 of 63 slices shown]
[im 21/63  brain]
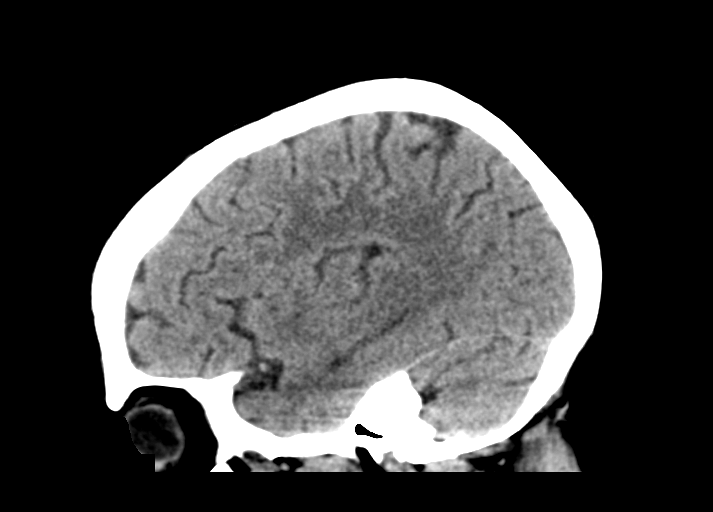
[im 32/63  brain]
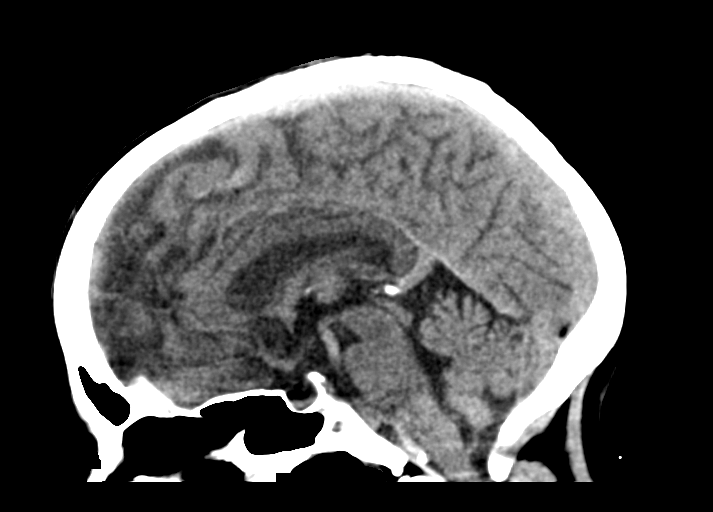
[im 42/63  brain]
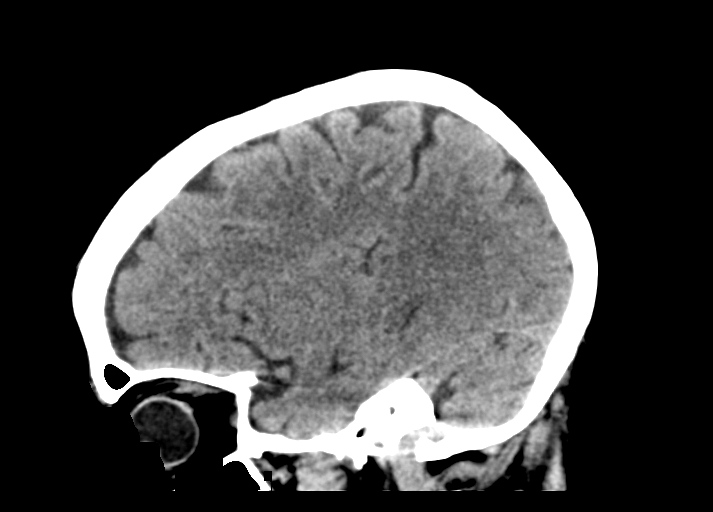

[Series 7: true axial · axial · 0.33mm/px · z∈[+70,+155]mm · 4 of 29 slices shown]
[im 6/29  brain]
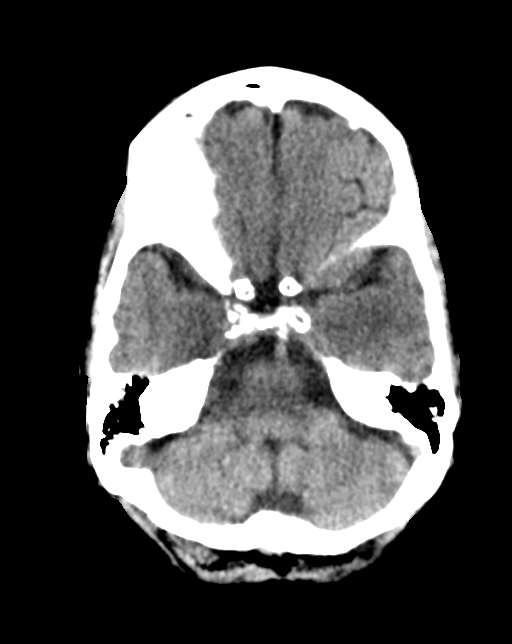
[im 12/29  brain]
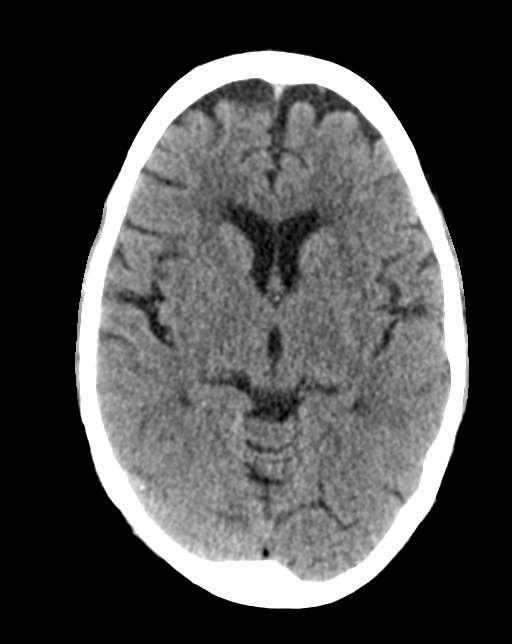
[im 17/29  brain]
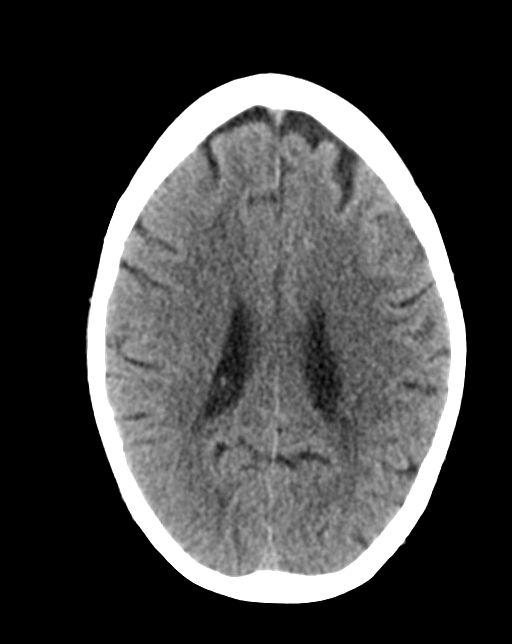
[im 23/29  brain]
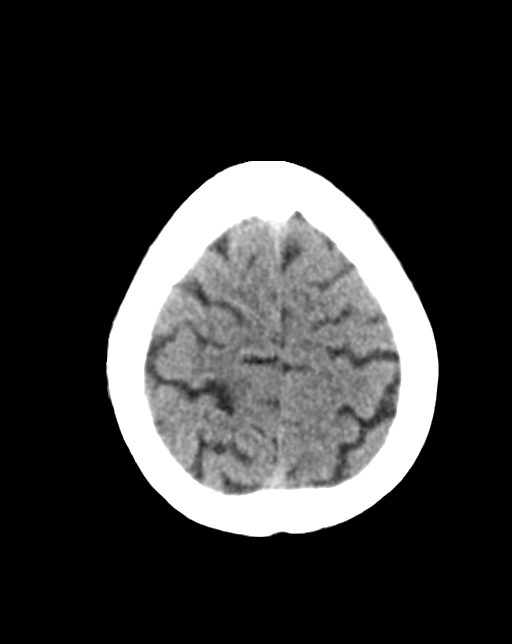

[15 of 47 positions shown; findings below may reference images not displayed]

FINDINGS: Brain: No acute intracranial abnormality. Specifically, no
hemorrhage, hydrocephalus, mass lesion, acute infarction, or
significant intracranial injury.

Vascular: No hyperdense vessel or unexpected calcification.

Skull: No acute calvarial abnormality.

Sinuses/Orbits: No acute findings

Other: None
IMPRESSION: No acute intracranial abnormality.

## 2021-07-30 MED ORDER — ACETAMINOPHEN 650 MG RE SUPP: 650.0000 mg | RECTAL | Status: DC | PRN

## 2021-07-30 MED ORDER — IRBESARTAN 300 MG PO TABS: 300.0000 mg | ORAL_TABLET | Freq: Every day | ORAL | Status: DC

## 2021-07-30 MED ORDER — ACETAMINOPHEN 325 MG PO TABS: 650.0000 mg | ORAL_TABLET | ORAL | Status: DC | PRN

## 2021-07-30 MED ORDER — TRAMADOL HCL 50 MG PO TABS
50.0000 mg | ORAL_TABLET | Freq: Two times a day (BID) | ORAL | Status: DC
Start: 2021-07-31 — End: 2021-08-02
  Administered 2021-07-31: 50 mg via ORAL
  Administered 2021-08-01: 25 mg via ORAL
  Administered 2021-08-01 – 2021-08-02 (×2): 50 mg via ORAL
  Filled 2021-07-30 (×6): qty 1

## 2021-07-30 MED ORDER — ASPIRIN EC 81 MG PO TBEC: 81.0000 mg | DELAYED_RELEASE_TABLET | Freq: Every day | ORAL | Status: DC

## 2021-07-30 MED ORDER — ACETAMINOPHEN 160 MG/5ML PO SOLN: 650.0000 mg | ORAL | Status: DC | PRN

## 2021-07-30 MED ORDER — STROKE: EARLY STAGES OF RECOVERY BOOK
Freq: Once | Status: DC
  Filled 2021-07-30: qty 1

## 2021-07-30 MED ORDER — ROPINIROLE HCL 1 MG PO TABS
1.0000 mg | ORAL_TABLET | Freq: Every day | ORAL | Status: DC
Start: 2021-07-31 — End: 2021-08-02
  Administered 2021-07-31 – 2021-08-01 (×2): 1 mg via ORAL
  Filled 2021-07-30 (×2): qty 1

## 2021-07-30 MED ORDER — ROPINIROLE HCL 1 MG PO TABS
1.0000 mg | ORAL_TABLET | Freq: Once | ORAL | Status: AC
  Administered 2021-07-30: 1 mg via ORAL
  Filled 2021-07-30: qty 1

## 2021-07-30 MED ORDER — HEPARIN SODIUM (PORCINE) 5000 UNIT/ML IJ SOLN
5000.0000 [IU] | Freq: Three times a day (TID) | INTRAMUSCULAR | Status: DC
  Administered 2021-07-30 – 2021-08-02 (×9): 5000 [IU] via SUBCUTANEOUS
  Filled 2021-07-30 (×9): qty 1

## 2021-07-30 MED ORDER — HYDROXYCHLOROQUINE SULFATE 200 MG PO TABS
200.0000 mg | ORAL_TABLET | Freq: Every day | ORAL | Status: DC
  Administered 2021-07-31 – 2021-08-02 (×3): 200 mg via ORAL
  Filled 2021-07-30 (×3): qty 1

## 2021-07-30 MED ORDER — TRAMADOL HCL 50 MG PO TABS
50.0000 mg | ORAL_TABLET | Freq: Once | ORAL | Status: AC
  Administered 2021-07-30: 50 mg via ORAL
  Filled 2021-07-30: qty 1

## 2021-07-30 MED ORDER — AMLODIPINE BESYLATE 5 MG PO TABS: 5.0000 mg | ORAL_TABLET | Freq: Every day | ORAL | Status: DC

## 2021-07-30 MED ORDER — ASPIRIN EC 81 MG PO TBEC
81.0000 mg | DELAYED_RELEASE_TABLET | Freq: Every day | ORAL | Status: DC
  Administered 2021-07-31: 81 mg via ORAL
  Filled 2021-07-30: qty 1

## 2021-07-30 MED ORDER — IOHEXOL 350 MG/ML SOLN
75.0000 mL | Freq: Once | INTRAVENOUS | Status: AC | PRN
  Administered 2021-07-30: 75 mL via INTRAVENOUS

## 2021-07-30 MED ORDER — DULOXETINE HCL 20 MG PO CPEP
20.0000 mg | ORAL_CAPSULE | Freq: Every day | ORAL | Status: DC
  Administered 2021-07-30 – 2021-08-02 (×4): 20 mg via ORAL
  Filled 2021-07-30 (×4): qty 1

## 2021-07-30 MED ORDER — SENNOSIDES-DOCUSATE SODIUM 8.6-50 MG PO TABS: 1.0000 | ORAL_TABLET | Freq: Every evening | ORAL | Status: DC | PRN

## 2021-07-30 MED ORDER — ASPIRIN 325 MG PO TABS: 325.0000 mg | ORAL_TABLET | Freq: Every day | ORAL | Status: DC

## 2021-07-30 MED ORDER — LOSARTAN POTASSIUM 50 MG PO TABS
100.0000 mg | ORAL_TABLET | Freq: Every day | ORAL | Status: DC
  Administered 2021-07-31: 100 mg via ORAL
  Filled 2021-07-30: qty 2

## 2021-07-30 MED ORDER — HYDRALAZINE HCL 50 MG PO TABS
50.0000 mg | ORAL_TABLET | Freq: Two times a day (BID) | ORAL | Status: DC
  Administered 2021-07-30 – 2021-07-31 (×2): 50 mg via ORAL
  Filled 2021-07-30 (×2): qty 1

## 2021-07-30 MED ORDER — AMLODIPINE BESYLATE 5 MG PO TABS
5.0000 mg | ORAL_TABLET | Freq: Every day | ORAL | Status: DC
  Administered 2021-07-31: 5 mg via ORAL
  Filled 2021-07-30: qty 1

## 2021-07-30 MED ORDER — CLOPIDOGREL BISULFATE 300 MG PO TABS: 300.0000 mg | ORAL_TABLET | Freq: Once | ORAL | Status: DC

## 2021-07-30 MED ORDER — AZATHIOPRINE 50 MG PO TABS
50.0000 mg | ORAL_TABLET | Freq: Every day | ORAL | Status: DC
  Administered 2021-07-31 – 2021-08-02 (×3): 50 mg via ORAL
  Filled 2021-07-30 (×3): qty 1

## 2021-07-30 MED ORDER — ROSUVASTATIN CALCIUM 20 MG PO TABS
20.0000 mg | ORAL_TABLET | Freq: Every day | ORAL | Status: DC
  Administered 2021-07-30: 20 mg via ORAL
  Filled 2021-07-30: qty 1

## 2021-07-30 MED ORDER — MELATONIN 5 MG PO TABS: 10.0000 mg | ORAL_TABLET | Freq: Every evening | ORAL | Status: DC | PRN

## 2021-07-30 NOTE — Assessment & Plan Note (Signed)
Continue home BP meds. 

## 2021-07-30 NOTE — H&P (Signed)
History and Physical    Kim [name] [id number] DOB: Nov 11, 1945 DOA: 07/30/2021  PCP: Curt Jews, PA-C   Patient coming from: Home  I have personally briefly reviewed patient's old medical records in [place]  CC: Right-sided facial droop, right upper extremity weakness. 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  Patient at home with her daughter-in-law.  Daughter-in-law noticed right facial droop.  Patient was having some difficulty talking.  Patient also had notable right upper extremity weakness.  Daughter-in-law activated EMS.  The time patient arrived to the ER, her symptoms had resolved.  Initial head CT was negative.  CTA and MRI have been ordered  Dtr in law states pt still taking asa and plavix at home.  Pt receiving home health PT.  Neurology consulted. [facility] asked to admit patient.   ED Course: CT head negative for acute pathology. Pt's symptoms resolved by the time pt arrived to ER.  Review of Systems:  Review of Systems  Constitutional: Negative.   HENT: Negative.    Eyes: Negative.   Respiratory: Negative.    Cardiovascular: Negative.   Gastrointestinal: Negative.   Genitourinary: Negative.   Musculoskeletal: Negative.   Skin: Negative.   Neurological:        Right facial droop and right UE weakness. Lasted about 5 mins. Resolved by the time pt arrived to ER.  Chronic restless legs  Endo/Heme/Allergies: Negative.   Psychiatric/Behavioral: Negative.    All other systems reviewed and are negative.  Past Medical History:  Diagnosis Date   Arthritis    Depression    Essential hypertension 06/28/2021   Mixed hyperlipidemia 06/28/2021    No past surgical history on file.   reports that she has never smoked. She  has never used smokeless tobacco. She reports that she does not drink alcohol and does not use drugs.  Allergies  Allergen Reactions   Gabapentin Other (See Comments)    unknown unknown    Labetalol Other (See Comments)    Bradycardia   Lisinopril Other (See Comments)   Nebivolol Other (See Comments)    Bradycardia,    Family History  Problem Relation Age of Onset   Heart disease Neg Hx     Prior to Admission medications   Medication Sig Start Date End Date Taking? Authorizing Provider  acetaminophen (TYLENOL) 325 MG tablet Take 2 tablets (650 mg total) by mouth every 6 (six) hours as needed for mild pain (or Fever >/= 101). 06/30/21   Terrilee Croak, MD  amLODipine (NORVASC) 5 MG tablet Take 5 mg by mouth daily. 05/14/15   [provider]  aspirin EC 81 MG tablet Take 1 tablet (81 mg total) by mouth daily. Swallow whole. 06/30/21 06/30/22  Terrilee Croak, MD  azaTHIOprine (IMURAN) 50 MG tablet Take 50 mg by mouth daily. 03/27/20   [provider]  DULoxetine (CYMBALTA) 20 MG capsule Take 20 mg by mouth daily.    [provider]  hydrALAZINE (APRESOLINE) 50 MG tablet Take 50 mg by mouth in the morning and at bedtime.    [provider]  hydroxychloroquine (PLAQUENIL) 200 MG tablet Take 200 mg by mouth daily.    [provider]  polyethylene glycol (MIRALAX / GLYCOLAX) 17 g packet Take 17 g by mouth daily as needed for mild constipation.  06/30/21   Terrilee Croak, MD  rOPINIRole (REQUIP) 1 MG tablet Take 1 mg by mouth at bedtime.    [provider]  rosuvastatin (CRESTOR) 20 MG tablet Take 20 mg by mouth daily.    [provider]  traMADol (ULTRAM) 50 MG tablet Take 50 mg by mouth 2 (two) times daily. 01/08/20   [provider]  valsartan (DIOVAN) 320 MG tablet Take 320 mg by mouth daily.    [provider]  Vitamin D, Ergocalciferol, (DRISDOL) 1.25 MG (50000 UNIT) CAPS capsule Take 50,000 Units by mouth every  7 (seven) days.    [provider]    Physical Exam: Vitals:   07/30/21 1616 07/30/21 1730 07/30/21 1800 07/30/21 1930  BP:  (!) 164/99 (!) 162/92 (!) 165/109  Pulse:  64 64 83  Resp:  19 (!) 22 17  Temp: 97.7 F (36.5 C)     TempSrc: Oral     SpO2:  97% 99% 97%    Physical Exam Vitals and nursing note reviewed.  Constitutional:      General: She is not in acute distress.    Appearance: Normal appearance. She is normal weight. She is not ill-appearing, toxic-appearing or diaphoretic.  HENT:     Head: Normocephalic and atraumatic.     Nose: Nose normal.  Eyes:     General: No scleral icterus. Cardiovascular:     Rate and Rhythm: Normal rate and regular rhythm.     Pulses: Normal pulses.  Pulmonary:     Effort: Pulmonary effort is normal. No respiratory distress.     Breath sounds: Normal breath sounds. No wheezing or rales.  Abdominal:     General: Abdomen is flat. Bowel sounds are normal.     Tenderness: There is no abdominal tenderness. There is no guarding.  Musculoskeletal:     Right lower leg: No edema.     Left lower leg: No edema.  Skin:    General: Skin is warm and dry.     Capillary Refill: Capillary refill takes less than 2 seconds.  Neurological:     General: No focal deficit present.     Mental Status: She is alert and oriented to person, place, and time.     Labs on Admission: I have personally reviewed following labs and imaging studies  CBC: Recent Labs  Lab 07/30/21 1625  WBC 3.5*  NEUTROABS 2.1  HGB 11.7*  HCT 35.5*  MCV 97.5  PLT [id number]   Basic Metabolic Panel: Recent Labs  Lab 07/30/21 1625  NA 134*  K 3.7  CL 103  CO2 25  GLUCOSE 120*  BUN 12  CREATININE 0.65  CALCIUM 9.0   GFR: CrCl cannot be calculated (Unknown ideal weight.). Liver Function Tests: Recent Labs  Lab 07/30/21 1625  AST 21  ALT 14  ALKPHOS 65  BILITOT 0.4  PROT 7.4  ALBUMIN 3.7   No results for input(s): LIPASE, AMYLASE in the last 168  hours. No results for input(s): AMMONIA in the last 168 hours. Coagulation Profile: No results for input(s): INR, PROTIME in the last 168 hours. Cardiac Enzymes: No results for input(s): CKTOTAL, CKMB, CKMBINDEX, TROPONINI in the last 168 hours. BNP (last 3 results) No results for input(s): PROBNP in the last 8760 hours. HbA1C: No results for input(s): HGBA1C in the last 72 hours. CBG: Recent Labs  Lab 07/30/21 1626  GLUCAP 114*   Lipid Profile: No results for input(s): CHOL, HDL, LDLCALC, TRIG, CHOLHDL, LDLDIRECT in the last  72 hours. Thyroid Function Tests: No results for input(s): TSH, T4TOTAL, FREET4, T3FREE, THYROIDAB in the last 72 hours. Anemia Panel: No results for input(s): VITAMINB12, FOLATE, FERRITIN, TIBC, IRON, RETICCTPCT in the last 72 hours. Urine analysis:    Component Value Date/Time   COLORURINE YELLOW 06/26/2021 2231   APPEARANCEUR CLEAR 06/26/2021 2231   LABSPEC 1.015 06/26/2021 2231   PHURINE 7.5 06/26/2021 2231   GLUCOSEU 250 (A) 06/26/2021 2231   HGBUR TRACE (A) 06/26/2021 2231   BILIRUBINUR NEGATIVE 06/26/2021 2231   KETONESUR NEGATIVE 06/26/2021 2231   PROTEINUR NEGATIVE 06/26/2021 2231   NITRITE NEGATIVE 06/26/2021 2231   LEUKOCYTESUR NEGATIVE 06/26/2021 2231    Radiological Exams on Admission: I have personally reviewed images CT Head Wo Contrast  Result Date: 07/30/2021 CLINICAL DATA:  TIA.  Facial droop. EXAM: CT HEAD WITHOUT CONTRAST TECHNIQUE: Contiguous axial images were obtained from the base of the skull through the vertex without intravenous contrast. RADIATION DOSE REDUCTION: This exam was performed according to the departmental dose-optimization program which includes automated exposure control, adjustment of the mA and/or kV according to patient size and/or use of iterative reconstruction technique. COMPARISON:  06/26/2021 FINDINGS: Brain: No acute intracranial abnormality. Specifically, no hemorrhage, hydrocephalus, mass lesion, acute  infarction, or significant intracranial injury. Vascular: No hyperdense vessel or unexpected calcification. Skull: No acute calvarial abnormality. Sinuses/Orbits: No acute findings Other: None IMPRESSION: No acute intracranial abnormality. Electronically Signed   By: Rolm Baptise M.D.   On: 07/30/2021 18:30   DG Chest Portable 1 View  Result Date: 07/30/2021 CLINICAL DATA:  Concern for pneumonia EXAM: PORTABLE CHEST 1 VIEW COMPARISON:  Chest x-ray dated June 26, 2021 FINDINGS: Cardiac and mediastinal contours are unchanged. Small hiatal hernia. 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 Visualized lungs are clear, left costophrenic angle excluded from the field of view. No large pleural effusion or pneumothorax. IMPRESSION: No active disease. Electronically Signed   By: Yetta Glassman M.D.   On: 07/30/2021 16:39    EKG: I have personally reviewed EKG: NSR   Assessment/Plan Principal Problem:   TIA (transient ischemic attack) Active Problems:   Restless leg syndrome   Essential hypertension   Mixed hyperlipidemia   Systemic lupus erythematosus (HCC)    TIA (transient ischemic attack) Admit to observation telemetry bed. Neurology consult. MRI brain. Check lipid panel. Continue with asa/plavix. PT/OT/ST consult. Heparin for DVT prophylaxis. Limited echo. Pt just had complete echo last month.  Restless leg syndrome Continue Ultram and Requip  Essential hypertension Continue home BP meds.  Mixed hyperlipidemia Continue statin.  Systemic lupus erythematosus (HCC) Continue Plaquenil.  DVT prophylaxis: SQ Heparin Code Status: Full Code Family Communication: discussed with pt's dtr-in-law at bedside  Disposition Plan: return home with [facility]  Consults called: EDP has consulted neurology  Admission status: Observation, Telemetry bed   Kristopher Oppenheim, DO [facility] 07/30/2021, 8:58 PM

## 2021-07-30 NOTE — Subjective & Objective (Signed)
CC: Right-sided facial droop, right upper extremity weakness. 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  Patient at home with her daughter-in-law.  Daughter-in-law noticed right facial droop.  Patient was having some difficulty talking.  Patient also had notable right upper extremity weakness.  Daughter-in-law activated EMS.  The time patient arrived to the ER, her symptoms had resolved.  Initial head CT was negative.  CTA and MRI have been ordered  Dtr in law states pt still taking asa and plavix at home.  Pt receiving home health PT.  Neurology consulted. [facility] asked to admit patient.

## 2021-07-30 NOTE — ED Notes (Signed)
Pt returned from CT °

## 2021-07-30 NOTE — ED Triage Notes (Signed)
BIB [facility] from home. Daughter at bedside with history. Previous stroke on June 26, 2021. Daughter states that pt called out and said that she was unable to use right arm, had slurred speech and right facial droop. Lasted 5 mins. Symptoms resolved. Pt back at baseline.   150/96  98% room air 80 Heart rate  171 CBG

## 2021-07-30 NOTE — Assessment & Plan Note (Signed)
-   Continue Plaquenil 

## 2021-07-30 NOTE — Assessment & Plan Note (Signed)
Continue statin. 

## 2021-07-30 NOTE — ED Notes (Signed)
Patient placed on purwick. Family at bedside.

## 2021-07-30 NOTE — Assessment & Plan Note (Signed)
Continue Ultram and Requip

## 2021-07-30 NOTE — ED Provider Notes (Signed)
[facility] EMERGENCY DEPARTMENT Provider Note   CSN: [id number] Arrival date & time: 07/30/21  1605     History  Chief Complaint  Patient presents with   Transient Ischemic Attack    Kim [name] is a 76 y.o. female.  HPI [name] is a 76 y.o. female with PMH significant for SLE, HTN, HLD, GERD, restless leg syndrome, osteoporosis and CVA with discharged on 06/30/2021 who presents to emergency department for evaluation of right arm weakness and right facial droop.  Denies trauma, infectious symptoms such as cough, vomiting, diarrhea, rashes.  He has been taking medications as prescribed since discharge from admission for CVA.  Normal amount of oral intake.  According to the patient's daughter who was at bedside at approximately 2 PM the patient had a transient episode where the right side of her face began to droop and her right arm lost its strength.  EMS was immediately called.  Prior to EMS arrival the patient had resolution of her symptoms.  Handoff was received from EMS who reports that she had resolution of her symptoms prior to arrival in the emergency department and her point-of-care glucose was greater than 150.     Home Medications Prior to Admission medications   Medication Sig Start Date End Date Taking? Authorizing Provider  acetaminophen (TYLENOL) 325 MG tablet Take 2 tablets (650 mg total) by mouth every 6 (six) hours as needed for mild pain (or Fever >/= 101). 06/30/21  Yes Dahal, Melina Schools, MD  amLODipine (NORVASC) 5 MG tablet Take 5 mg by mouth daily. 05/14/15  Yes [provider]  aspirin EC 81 MG tablet Take 1 tablet (81 mg total) by mouth daily. Swallow whole. 06/30/21 06/30/22 Yes Dahal, Melina Schools, MD  azaTHIOprine (IMURAN) 50 MG tablet Take 50 mg by mouth daily. 03/27/20  Yes [provider]  Capsaicin 0.1 % CREA Apply 1 application topically 2 (two) times daily as needed (tingling  and numbness to plantar foot).   Yes  [provider]  clopidogrel (PLAVIX) 75 MG tablet Take 75 mg by mouth daily.   Yes [provider]  hydrALAZINE (APRESOLINE) 50 MG tablet Take 50 mg by mouth in the morning and at bedtime.   Yes [provider]  hydroxychloroquine (PLAQUENIL) 200 MG tablet Take 200 mg by mouth daily.   Yes [provider]  losartan (COZAAR) 100 MG tablet Take 100 mg by mouth daily. 07/10/21  Yes [provider]  magnesium oxide (MAG-OX) 400 MG tablet Take 400 mg by mouth in the morning and at bedtime. 07/09/21  Yes [provider]  polyethylene glycol (MIRALAX / GLYCOLAX) 17 g packet Take 17 g by mouth daily as needed for mild constipation. 06/30/21  Yes Dahal, Melina Schools, MD  rOPINIRole (REQUIP) 1 MG tablet Take 1 mg by mouth at bedtime.   Yes [provider]  rosuvastatin (CRESTOR) 20 MG tablet Take 20 mg by mouth at bedtime.   Yes [provider]  senna-docusate (SENOKOT-S) 8.6-50 MG tablet Take 1 tablet by mouth 2 (two) times daily. 07/09/21  Yes [provider]  sodium chloride 1 g tablet Take 1 g by mouth 3 (three) times daily. 07/26/21  Yes [provider]  traMADol (ULTRAM) 50 MG tablet Take 50 mg by mouth at bedtime. 01/08/20  Yes [provider]  Vitamin D, Ergocalciferol, (DRISDOL) 1.25 MG (50000 UNIT) CAPS capsule Take 50,000 Units by mouth every Thursday.   Yes [provider]  DULoxetine (CYMBALTA) 20 MG  capsule Take 20 mg by mouth daily. Patient not taking: Reported on 07/30/2021    [provider]  valsartan (DIOVAN) 320 MG tablet Take 320 mg by mouth daily. Patient not taking: Reported on 07/30/2021    [provider]      Allergies    Gabapentin, Labetalol, Lisinopril, and Nebivolol    Review of Systems   Review of Systems  All other systems reviewed and are negative.  Physical Exam Updated Vital Signs BP (!) 165/109    Pulse 83    Temp 97.7 F (36.5 C) (Oral)    Resp  17    SpO2 97%  Physical Exam Vitals and nursing note reviewed.  Constitutional:      General: She is not in acute distress.    Appearance: She is well-developed.  HENT:     Head: Normocephalic and atraumatic.  Eyes:     Conjunctiva/sclera: Conjunctivae normal.  Cardiovascular:     Rate and Rhythm: Normal rate and regular rhythm.     Heart sounds: No murmur heard. Pulmonary:     Effort: Pulmonary effort is normal. No respiratory distress.     Breath sounds: Normal breath sounds.  Abdominal:     Palpations: Abdomen is soft.     Tenderness: There is no abdominal tenderness.  Musculoskeletal:        General: No swelling.     Cervical back: Neck supple.  Skin:    General: Skin is warm and dry.     Capillary Refill: Capillary refill takes less than 2 seconds.  Neurological:     General: No focal deficit present.     Mental Status: She is alert and oriented to person, place, and time. Mental status is at baseline.     Cranial Nerves: No cranial nerve deficit.     Sensory: No sensory deficit.     Motor: No weakness.  Psychiatric:        Mood and Affect: Mood normal.    ED Results / Procedures / Treatments   Labs (all labs ordered are listed, but only abnormal results are displayed) Labs Reviewed  CBC WITH DIFFERENTIAL/PLATELET - Abnormal; Notable for the following components:      Result Value   WBC 3.5 (*)    RBC 3.64 (*)    Hemoglobin 11.7 (*)    HCT 35.5 (*)    All other components within normal limits  COMPREHENSIVE METABOLIC PANEL - Abnormal; Notable for the following components:   Sodium 134 (*)    Glucose, Bld 120 (*)    All other components within normal limits  CBG MONITORING, ED - Abnormal; Notable for the following components:   Glucose-Capillary 114 (*)    All other components within normal limits  RESP PANEL BY RT-PCR (FLU A&B, COVID) ARPGX2  URINALYSIS, ROUTINE W REFLEX MICROSCOPIC  HEMOGLOBIN A1C  LIPID PANEL  CBC WITH DIFFERENTIAL/PLATELET   COMPREHENSIVE METABOLIC PANEL  MAGNESIUM    EKG None  Radiology CT Head Wo Contrast  Result Date: 07/30/2021 CLINICAL DATA:  TIA.  Facial droop. EXAM: CT HEAD WITHOUT CONTRAST TECHNIQUE: Contiguous axial images were obtained from the base of the skull through the vertex without intravenous contrast. RADIATION DOSE REDUCTION: This exam was performed according to the departmental dose-optimization program which includes automated exposure control, adjustment of the mA and/or kV according to patient size and/or use of iterative reconstruction technique. COMPARISON:  06/26/2021 FINDINGS: Brain: No acute intracranial abnormality. Specifically, no hemorrhage, hydrocephalus, mass lesion, acute infarction, or significant  intracranial injury. Vascular: No hyperdense vessel or unexpected calcification. Skull: No acute calvarial abnormality. Sinuses/Orbits: No acute findings Other: None IMPRESSION: No acute intracranial abnormality. Electronically Signed   By: Charlett NoseKevin  Dover M.D.   On: 07/30/2021 18:30   DG Chest Portable 1 View  Result Date: 07/30/2021 CLINICAL DATA:  Concern for pneumonia EXAM: PORTABLE CHEST 1 VIEW COMPARISON:  Chest x-ray dated June 26, 2021 FINDINGS: Cardiac and mediastinal contours are unchanged. Small hiatal hernia. 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 Visualized lungs are clear, left costophrenic angle excluded from the field of view. No large pleural effusion or pneumothorax. IMPRESSION: No active disease. Electronically Signed   By: Allegra LaiLeah  Strickland M.D.   On: 07/30/2021 16:39    Procedures Procedures   Medications Ordered in ED Medications  aspirin tablet 325 mg (has no administration in time range)  rOPINIRole (REQUIP) tablet 1 mg (has no administration in time range)  traMADol (ULTRAM) tablet 50 mg (has no administration in time range)  aspirin EC tablet 81 mg  (has no administration in time range)  traMADol (ULTRAM) tablet 50 mg (has no administration in time range)  hydroxychloroquine (PLAQUENIL) tablet 200 mg (has no administration in time range)  amLODipine (NORVASC) tablet 5 mg (has no administration in time range)  hydrALAZINE (APRESOLINE) tablet 50 mg (has no administration in time range)  rosuvastatin (CRESTOR) tablet 20 mg (has no administration in time range)  DULoxetine (CYMBALTA) DR capsule 20 mg (has no administration in time range)  irbesartan (AVAPRO) tablet 300 mg (has no administration in time range)  azaTHIOprine (IMURAN) tablet 50 mg (has no administration in time range)  rOPINIRole (REQUIP) tablet 1 mg (has no administration in time range)   stroke: mapping our early stages of recovery book (has no administration in time range)  acetaminophen (TYLENOL) tablet 650 mg (has no administration in time range)    Or  acetaminophen (TYLENOL) 160 MG/5ML solution 650 mg (has no administration in time range)    Or  acetaminophen (TYLENOL) suppository 650 mg (has no administration in time range)  senna-docusate (Senokot-S) tablet 1 tablet (has no administration in time range)  heparin injection 5,000 Units (has no administration in time range)  melatonin tablet 10 mg (has no administration in time range)  iohexol (OMNIPAQUE) 350 MG/ML injection 75 mL (75 mLs Intravenous Contrast Given 07/30/21 2029)    ED Course/ Medical Decision Making/ A&P                           Medical Decision Making Amount and/or Complexity of Data Reviewed Independent Historian: caregiver    Details: Daughter clarified history External Data Reviewed: notes.    Details: Prior stroke discharged on 20d of plavix and ASA Labs: ordered. Decision-making details documented in ED Course. Radiology: ordered and independent interpretation performed. ECG/medicine tests: ordered.  Risk Prescription drug management. Decision regarding hospitalization.   This is a  76 year old female with multiple vascular risk factors presents to emergency department for evaluation of transient facial droop and right upper extremity weakness.  Differential diagnosis includes but is not limited to the following: TIA, ischemic stroke, hemorrhagic stroke, troponin abnormality, conversion disorder.  Will obtain CT head without contrast to further evaluate.  CT head without contrast was negative for acute intracranial abnormality reviewed independently.  Lab work-up was obtained which did not reveal significant electrolyte abnormalities to cause the  patient's symptoms.  I spoke with the on-call neurology team and recommended CTA head neck and MRI without to further evaluate TIA symptoms.  They also recommended the patient be loaded with aspirin and Plavix.  These medications were ordered.  Throughout the patient's stay in the emergency department she did not have recrudescence of her neurological symptoms.  Specifically no facial droop no right upper extremity weakness.  The hospitalist team was consulted for admission and agreed to admit the patient to their service for further management of her TIA.  Final Clinical Impression(s) / ED Diagnoses Final diagnoses:  TIA (transient ischemic attack)    Rx / DC Orders ED Discharge Orders     None         Camila Li, MD 07/30/21 1610    Pricilla Loveless, MD 08/02/21 [phone]

## 2021-07-30 NOTE — Assessment & Plan Note (Signed)
Admit to observation telemetry bed. Neurology consult. MRI brain. Check lipid panel. Continue with asa/plavix. PT/OT/ST consult. Heparin for DVT prophylaxis. Limited echo. Pt just had complete echo last month.

## 2021-07-31 ENCOUNTER — Observation Stay (HOSPITAL_COMMUNITY): Payer: Medicare Other

## 2021-07-31 DIAGNOSIS — Z8673 Personal history of transient ischemic attack (TIA), and cerebral infarction without residual deficits: Secondary | ICD-10-CM | POA: Diagnosis not present

## 2021-07-31 DIAGNOSIS — I63412 Cerebral infarction due to embolism of left middle cerebral artery: Principal | ICD-10-CM

## 2021-07-31 DIAGNOSIS — F32A Depression, unspecified: Secondary | ICD-10-CM | POA: Diagnosis present

## 2021-07-31 DIAGNOSIS — Q2112 Patent foramen ovale: Secondary | ICD-10-CM | POA: Diagnosis not present

## 2021-07-31 DIAGNOSIS — Z7982 Long term (current) use of aspirin: Secondary | ICD-10-CM | POA: Diagnosis not present

## 2021-07-31 DIAGNOSIS — Z79891 Long term (current) use of opiate analgesic: Secondary | ICD-10-CM | POA: Diagnosis not present

## 2021-07-31 DIAGNOSIS — Z888 Allergy status to other drugs, medicaments and biological substances status: Secondary | ICD-10-CM | POA: Diagnosis not present

## 2021-07-31 DIAGNOSIS — E782 Mixed hyperlipidemia: Secondary | ICD-10-CM | POA: Diagnosis present

## 2021-07-31 DIAGNOSIS — Z79899 Other long term (current) drug therapy: Secondary | ICD-10-CM | POA: Diagnosis not present

## 2021-07-31 DIAGNOSIS — I639 Cerebral infarction, unspecified: Secondary | ICD-10-CM | POA: Diagnosis not present

## 2021-07-31 DIAGNOSIS — R471 Dysarthria and anarthria: Secondary | ICD-10-CM | POA: Diagnosis present

## 2021-07-31 DIAGNOSIS — D638 Anemia in other chronic diseases classified elsewhere: Secondary | ICD-10-CM | POA: Diagnosis present

## 2021-07-31 DIAGNOSIS — I10 Essential (primary) hypertension: Secondary | ICD-10-CM | POA: Diagnosis present

## 2021-07-31 DIAGNOSIS — E871 Hypo-osmolality and hyponatremia: Secondary | ICD-10-CM | POA: Diagnosis present

## 2021-07-31 DIAGNOSIS — M1990 Unspecified osteoarthritis, unspecified site: Secondary | ICD-10-CM | POA: Diagnosis present

## 2021-07-31 DIAGNOSIS — R29810 Facial weakness: Secondary | ICD-10-CM | POA: Diagnosis present

## 2021-07-31 DIAGNOSIS — G2581 Restless legs syndrome: Secondary | ICD-10-CM | POA: Diagnosis present

## 2021-07-31 DIAGNOSIS — E876 Hypokalemia: Secondary | ICD-10-CM | POA: Diagnosis present

## 2021-07-31 DIAGNOSIS — R29700 NIHSS score 0: Secondary | ICD-10-CM | POA: Diagnosis present

## 2021-07-31 DIAGNOSIS — I340 Nonrheumatic mitral (valve) insufficiency: Secondary | ICD-10-CM | POA: Diagnosis not present

## 2021-07-31 DIAGNOSIS — M329 Systemic lupus erythematosus, unspecified: Secondary | ICD-10-CM | POA: Diagnosis present

## 2021-07-31 DIAGNOSIS — I351 Nonrheumatic aortic (valve) insufficiency: Secondary | ICD-10-CM | POA: Diagnosis not present

## 2021-07-31 DIAGNOSIS — K219 Gastro-esophageal reflux disease without esophagitis: Secondary | ICD-10-CM | POA: Diagnosis present

## 2021-07-31 DIAGNOSIS — I77810 Thoracic aortic ectasia: Secondary | ICD-10-CM | POA: Diagnosis present

## 2021-07-31 DIAGNOSIS — G459 Transient cerebral ischemic attack, unspecified: Secondary | ICD-10-CM | POA: Diagnosis not present

## 2021-07-31 DIAGNOSIS — M810 Age-related osteoporosis without current pathological fracture: Secondary | ICD-10-CM | POA: Diagnosis present

## 2021-07-31 DIAGNOSIS — G8321 Monoplegia of upper limb affecting right dominant side: Secondary | ICD-10-CM | POA: Diagnosis present

## 2021-07-31 DIAGNOSIS — Z20822 Contact with and (suspected) exposure to covid-19: Secondary | ICD-10-CM | POA: Diagnosis present

## 2021-07-31 LAB — COMPREHENSIVE METABOLIC PANEL
ALT: 14 U/L (ref 0–44)
AST: 19 U/L (ref 15–41)
Albumin: 3.6 g/dL (ref 3.5–5.0)
Alkaline Phosphatase: 65 U/L (ref 38–126)
Anion gap: 7 (ref 5–15)
BUN: 9 mg/dL (ref 8–23)
CO2: 24 mmol/L (ref 22–32)
Calcium: 9.3 mg/dL (ref 8.9–10.3)
Chloride: 103 mmol/L (ref 98–111)
Creatinine, Ser: 0.64 mg/dL (ref 0.44–1.00)
GFR, Estimated: >60 mL/min (ref >60)
Glucose, Bld: 108 mg/dL — ABNORMAL HIGH (ref 70–99)
Potassium: 3.4 mmol/L — ABNORMAL LOW (ref 3.5–5.1)
Sodium: 134 mmol/L — ABNORMAL LOW (ref 135–145)
Total Bilirubin: 0.4 mg/dL (ref 0.3–1.2)
Total Protein: 7.2 g/dL (ref 6.5–8.1)

## 2021-07-31 LAB — URINALYSIS, ROUTINE W REFLEX MICROSCOPIC
Bilirubin Urine: NEGATIVE
Glucose, UA: NEGATIVE mg/dL
Hgb urine dipstick: NEGATIVE
Ketones, ur: NEGATIVE mg/dL
Leukocytes,Ua: NEGATIVE
Nitrite: NEGATIVE
Protein, ur: NEGATIVE mg/dL
Specific Gravity, Urine: 1.025 (ref 1.005–1.030)
pH: 7 (ref 5.0–8.0)

## 2021-07-31 LAB — CBC WITH DIFFERENTIAL/PLATELET
Abs Immature Granulocytes: 0.01 10*3/uL (ref 0.00–0.07)
Basophils Absolute: 0.1 10*3/uL (ref 0.0–0.1)
Basophils Relative: 1 %
Eosinophils Absolute: 0.2 10*3/uL (ref 0.0–0.5)
Eosinophils Relative: 2 %
HCT: 32.9 % — ABNORMAL LOW (ref 36.0–46.0)
Hemoglobin: 11.5 g/dL — ABNORMAL LOW (ref 12.0–15.0)
Immature Granulocytes: 0 %
Lymphocytes Relative: 8 %
Lymphs Abs: 0.5 10*3/uL — ABNORMAL LOW (ref 0.7–4.0)
MCH: 32.6 pg (ref 26.0–34.0)
MCHC: 35 g/dL (ref 30.0–36.0)
MCV: 93.2 fL (ref 80.0–100.0)
Monocytes Absolute: 0.8 10*3/uL (ref 0.1–1.0)
Monocytes Relative: 12 %
Neutro Abs: 4.8 10*3/uL (ref 1.7–7.7)
Neutrophils Relative %: 77 %
Platelets: 312 10*3/uL (ref 150–400)
RBC: 3.53 MIL/uL — ABNORMAL LOW (ref 3.87–5.11)
RDW: 13.1 % (ref 11.5–15.5)
WBC: 6.3 10*3/uL (ref 4.0–10.5)
nRBC: 0 % (ref 0.0–0.2)

## 2021-07-31 LAB — LIPID PANEL
Cholesterol: 142 mg/dL (ref 0–200)
HDL: 55 mg/dL (ref >40)
LDL Cholesterol: 77 mg/dL (ref 0–99)
Total CHOL/HDL Ratio: 2.6 RATIO
Triglycerides: 49 mg/dL (ref <150)
VLDL: 10 mg/dL (ref 0–40)

## 2021-07-31 LAB — C-REACTIVE PROTEIN: CRP: 0.5 mg/dL (ref <1.0)

## 2021-07-31 LAB — MAGNESIUM: Magnesium: 2.1 mg/dL (ref 1.7–2.4)

## 2021-07-31 LAB — SEDIMENTATION RATE: Sed Rate: 18 mm/hr (ref 0–22)

## 2021-07-31 IMAGING — MR MR HEAD W/O CM
10 of 11 series · 42 of 48 positions shown · non-contrast
Comparison: Prior CTs from [DATE].

CLINICAL DATA: Initial evaluation for acute TIA, right-sided
weakness.

EXAM:
MRI HEAD WITHOUT CONTRAST
TECHNIQUE: Multiplanar, multiecho pulse sequences of the brain and surrounding
structures were obtained without intravenous contrast.

[Series 5: DWI · axial · 3.0mm · 0.88mm/px · z∈[-69,+71]mm · 9 of 96 slices shown (1 of 4)]
[im 1/96]
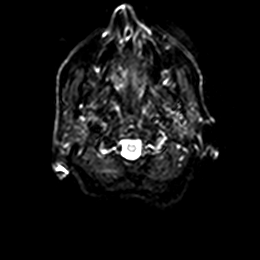
[im 12/96]
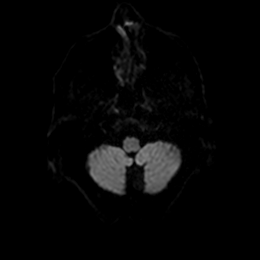
[im 24/96]
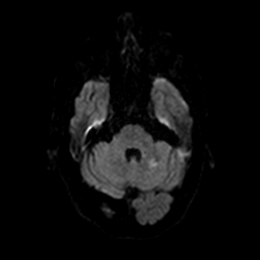
[im 36/96]
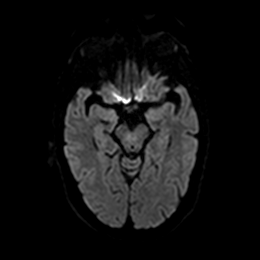
[im 48/96]
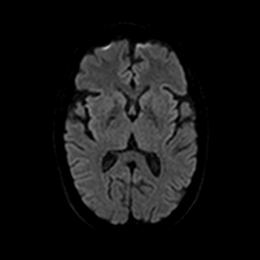
[im 60/96]
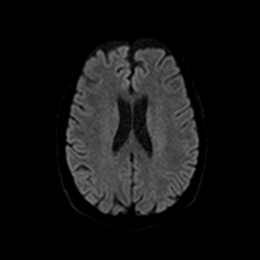
[im 72/96]
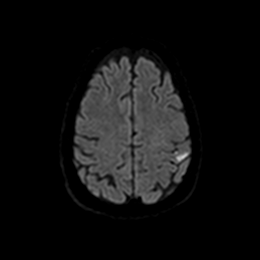
[im 84/96]
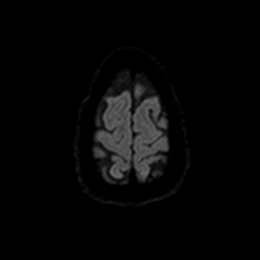
[im 96/96]
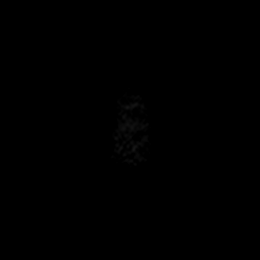

[Series 6: DWI · axial · 3.0mm · 0.88mm/px · z∈[-69,+71]mm · 4 of 46 slices shown (2 of 4)]
[im 1/46]
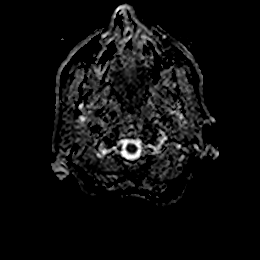
[im 16/46]
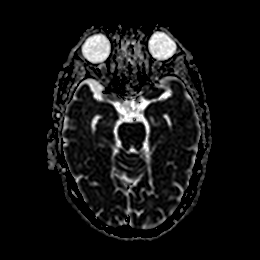
[im 31/46]
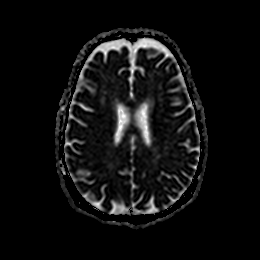
[im 46/46]
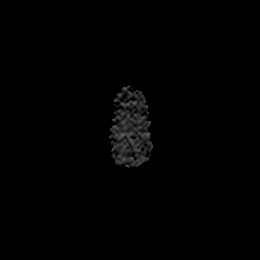

[Series 7: DWI · coronal · 4.0mm · 0.88mm/px · 6 of 66 slices shown (3 of 4)]
[im 1/66]
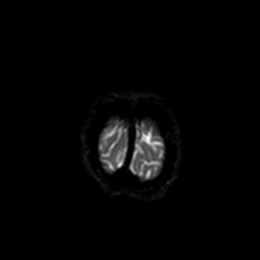
[im 14/66]
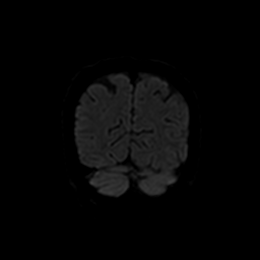
[im 27/66]
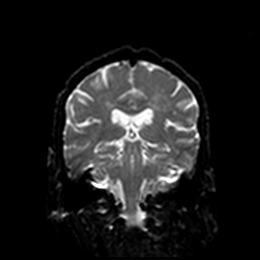
[im 40/66]
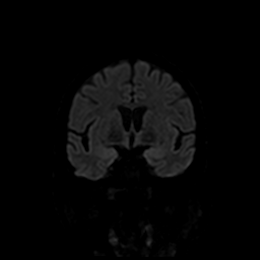
[im 53/66]
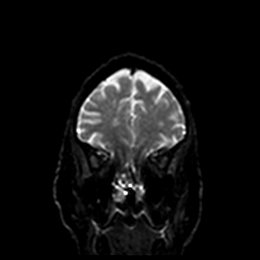
[im 66/66]
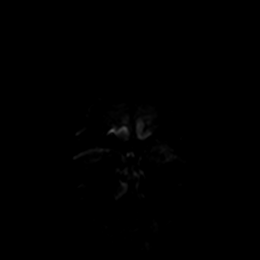

[Series 8: DWI · coronal · 4.0mm · 0.88mm/px · 3 of 33 slices shown (4 of 4)]
[im 1/33]
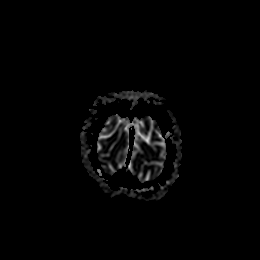
[im 17/33]
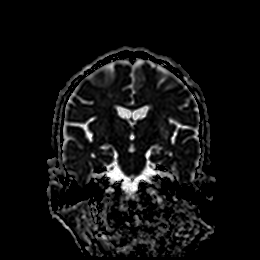
[im 33/33]
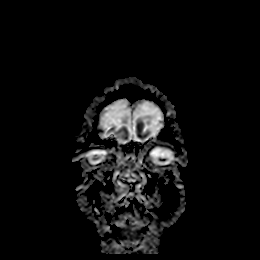

[Series 9: T1 · sagittal · 5.0mm · 0.75mm/px · 2 of 23 slices shown]
[im 1/23]
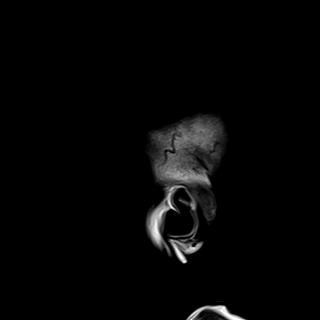
[im 23/23]
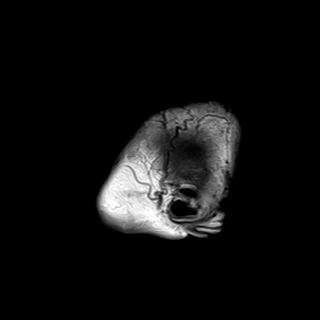

[Series 10: T2 · axial · 5.0mm · 0.72mm/px · z∈[-71,+73]mm · 2 of 25 slices shown (1 of 2)]
[im 1/25]
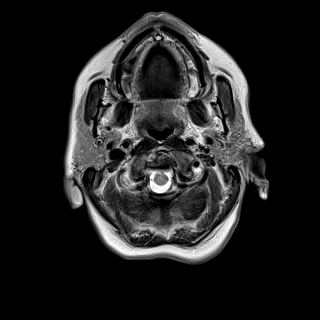
[im 25/25]
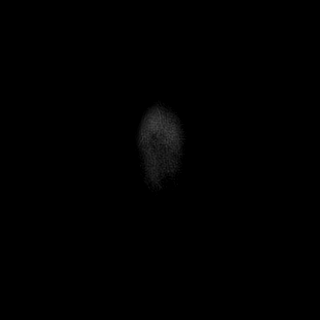

[Series 11: FLAIR · axial · 5.0mm · 0.45mm/px · z∈[-72,+71]mm · 2 of 25 slices shown]
[im 1/25]
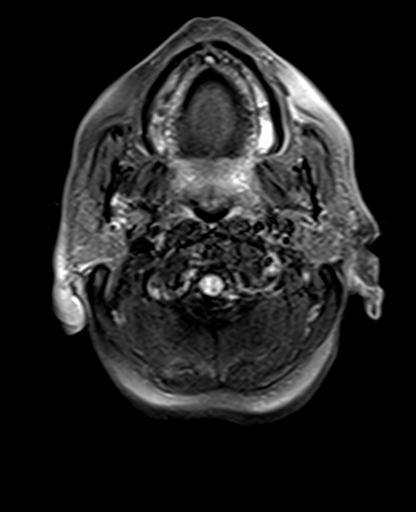
[im 25/25]
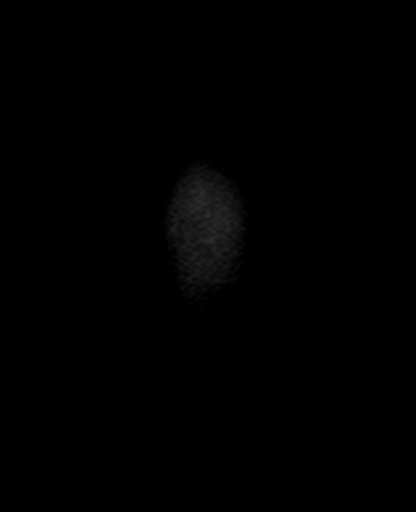

[Series 13: pha_images · axial · 3.0mm · 0.90mm/px · z∈[-85,+88]mm · 5 of 55 slices shown]
[im 1/55]
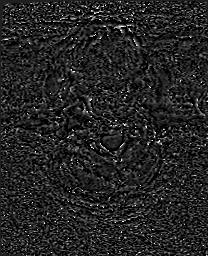
[im 14/55]
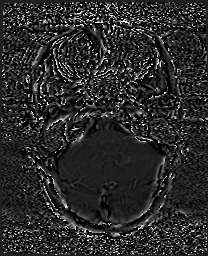
[im 28/55]
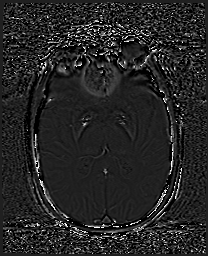
[im 41/55]
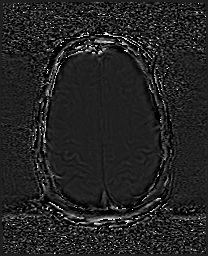
[im 55/55]
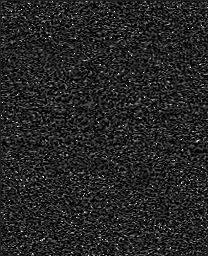

[Series 14: swi_images · axial · 3.0mm · 0.90mm/px · z∈[-88,+88]mm · 6 of 60 slices shown]
[im 1/60]
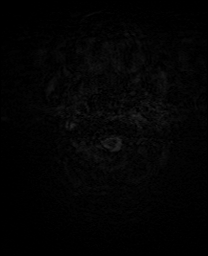
[im 12/60]
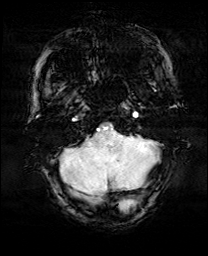
[im 24/60]
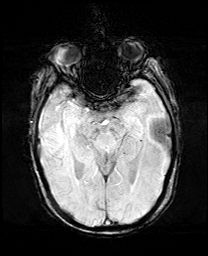
[im 36/60]
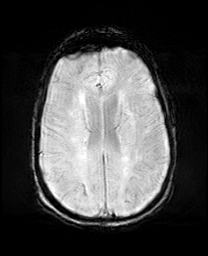
[im 48/60]
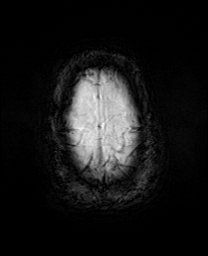
[im 60/60]
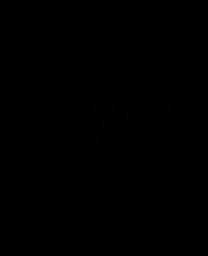

[Series 17: T2 · coronal · 5.0mm · 0.34mm/px · 3 of 29 slices shown (2 of 2)]
[im 1/29]
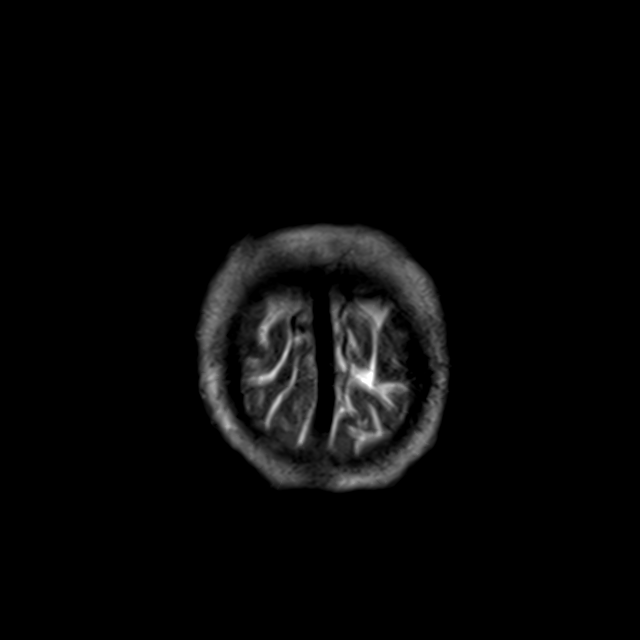
[im 15/29]
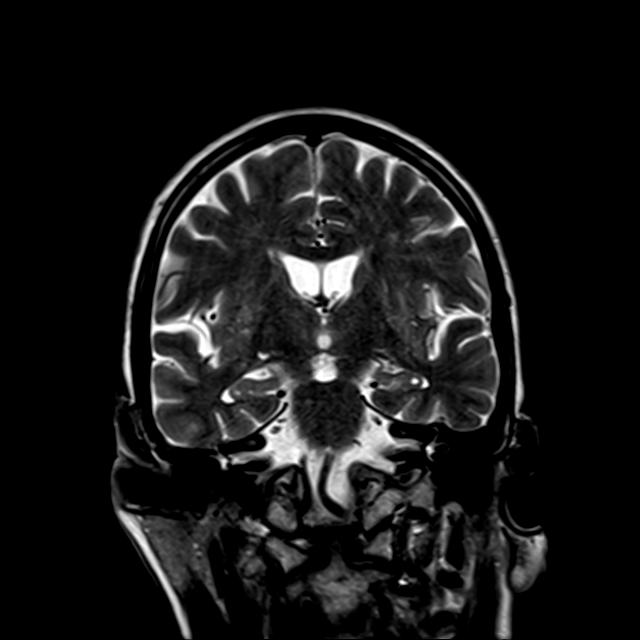
[im 29/29]
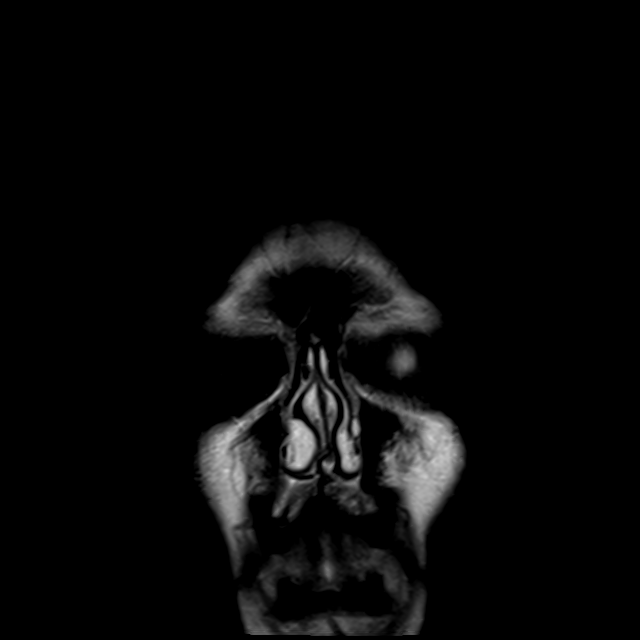

[42 of 48 positions shown; findings below may reference images not displayed]

FINDINGS: Brain: Cerebral volume within normal limits. Patchy T2/FLAIR
hyperintensity involving the periventricular deep white matter both
cerebral hemispheres most consistent with chronic vessel ischemic
disease, mild for age. Few scattered superimposed remote lacunar
infarcts present about the hemispheric cerebral white matter. Remote
hemorrhagic left cerebellar infarct noted. Mild diffusion signal
abnormality at this location felt to be consistent with artifact.

1.5 cm acute ischemic infarct seen involving the posterior left
parietal lobe, postcentral gyrus (series 5, image 84). No associated
hemorrhage or mass effect. No other evidence for acute or subacute
ischemia. Gray-white matter differentiation otherwise maintained. No
acute intracranial hemorrhage.

No mass lesion or midline shift. No hydrocephalus or extra-axial
fluid collection. Pituitary gland suprasellar region normal. Midline
structures intact.

Vascular: Major intracranial vascular flow voids are maintained.

Skull and upper cervical spine: Craniocervical junction within
normal limits. Bone marrow signal intensity normal. No scalp soft
tissue abnormality.

Sinuses/Orbits: Globes and orbital soft tissues within normal
limits. Mild scattered mucosal thickening noted within the ethmoidal
air cells. No mastoid effusion.

Other: None.
IMPRESSION: 1. 1.5 cm acute ischemic nonhemorrhagic left parietal lobe infarct.
2. Remote hemorrhagic left cerebellar infarct.
3. Underlying mild chronic microvascular ischemic disease for age.

## 2021-07-31 MED ORDER — POTASSIUM CHLORIDE 20 MEQ PO PACK
40.0000 meq | PACK | Freq: Two times a day (BID) | ORAL | Status: AC
  Administered 2021-07-31: 40 meq via ORAL
  Filled 2021-07-31 (×2): qty 2

## 2021-07-31 MED ORDER — SENNOSIDES-DOCUSATE SODIUM 8.6-50 MG PO TABS
1.0000 | ORAL_TABLET | Freq: Two times a day (BID) | ORAL | Status: DC
  Administered 2021-07-31 – 2021-08-02 (×5): 1 via ORAL
  Filled 2021-07-31 (×5): qty 1

## 2021-07-31 MED ORDER — ROSUVASTATIN CALCIUM 20 MG PO TABS
40.0000 mg | ORAL_TABLET | Freq: Every day | ORAL | Status: DC
  Administered 2021-07-31 – 2021-08-02 (×3): 40 mg via ORAL
  Filled 2021-07-31 (×3): qty 2

## 2021-07-31 MED ORDER — CLOPIDOGREL BISULFATE 75 MG PO TABS
75.0000 mg | ORAL_TABLET | Freq: Every day | ORAL | Status: DC
  Administered 2021-07-31 – 2021-08-02 (×3): 75 mg via ORAL
  Filled 2021-07-31 (×3): qty 1

## 2021-07-31 MED ORDER — MAGNESIUM OXIDE -MG SUPPLEMENT 400 (240 MG) MG PO TABS
400.0000 mg | ORAL_TABLET | Freq: Two times a day (BID) | ORAL | Status: DC
  Administered 2021-07-31 – 2021-08-02 (×4): 400 mg via ORAL
  Filled 2021-07-31 (×6): qty 1

## 2021-07-31 MED ORDER — SODIUM CHLORIDE 1 G PO TABS
1.0000 g | ORAL_TABLET | Freq: Three times a day (TID) | ORAL | Status: DC
  Administered 2021-07-31 – 2021-08-02 (×7): 1 g via ORAL
  Filled 2021-07-31 (×9): qty 1

## 2021-07-31 MED ORDER — ASPIRIN EC 325 MG PO TBEC
325.0000 mg | DELAYED_RELEASE_TABLET | Freq: Every day | ORAL | Status: DC
  Administered 2021-08-01 – 2021-08-02 (×2): 325 mg via ORAL
  Filled 2021-07-31 (×2): qty 1

## 2021-07-31 MED ORDER — CAPSAICIN 0.025 % EX CREA
1.0000 "application " | TOPICAL_CREAM | Freq: Two times a day (BID) | CUTANEOUS | Status: DC | PRN
  Filled 2021-07-31: qty 60

## 2021-07-31 NOTE — Plan of Care (Signed)
  Problem: Education: Goal: Knowledge of disease or condition will improve Outcome: Progressing   Problem: Ischemic Stroke/TIA Tissue Perfusion: Goal: Complications of ischemic stroke/TIA will be minimized Outcome: Progressing   

## 2021-07-31 NOTE — Evaluation (Signed)
Physical Therapy Evaluation Patient Details Name: Kim BollSurinder Silverio MRN: [id number] DOB: 09/24/1945 Today's Date: 07/31/2021  History of Present Illness  76 y/o female presented to ED on 07/30/21 with R sided weakness and facial droop lasting 15 minutes. MRI showed 1.5 cm acute ischemic nonhemorrhagic L parietal lobe infarct and remote hemorrhagic L cerebellar infarct. PMH: hypertension, hyperlipidemia, restless leg syndrome and osteoporosis  Clinical Impression  Patient admitted with above diagnosis. Patient presents with generalized weakness, decreased activity tolerance, impaired balance. Patient currently ambulating with RW and min guard. Difficult to assess cognition due to language barrier. Patient will benefit from skilled PT services during acute stay to address listed deficits. Recommend HHPT at discharge as patient was previously active after leaving rehab.        Recommendations for follow up therapy are one component of a multi-disciplinary discharge planning process, led by the attending physician.  Recommendations may be updated based on patient status, additional functional criteria and insurance authorization.  Follow Up Recommendations Home health PT    Assistance Recommended at Discharge Intermittent Supervision/Assistance  Patient can return home with the following  A little help with walking and/or transfers;Assistance with cooking/housework;Direct supervision/assist for medications management;Direct supervision/assist for financial management    Equipment Recommendations None recommended by PT  Recommendations for Other Services       Functional Status Assessment Patient has had a recent decline in their functional status and demonstrates the ability to make significant improvements in function in a reasonable and predictable amount of time.     Precautions / Restrictions Precautions Precautions: Fall Restrictions Weight Bearing Restrictions: No      Mobility  Bed  Mobility Overal bed mobility: Needs Assistance Bed Mobility: Supine to Sit     Supine to sit: Supervision     General bed mobility comments: supervision for safety    Transfers Overall transfer level: Needs assistance Equipment used: Rolling Tally Mckinnon (2 wheels) Transfers: Sit to/from Stand Sit to Stand: Supervision           General transfer comment: supervision for safety    Ambulation/Gait Ambulation/Gait assistance: Min guard Gait Distance (Feet): 100 Feet Assistive device: Rolling Aeden Matranga (2 wheels) Gait Pattern/deviations: Trunk flexed, Narrow base of support, Step-through pattern, Decreased stride length Gait velocity: decreased     General Gait Details: min guard for safety. Short steps throughout but no LOB noted  Stairs            Wheelchair Mobility    Modified Rankin (Stroke Patients Only) Modified Rankin (Stroke Patients Only) Pre-Morbid Rankin Score: Moderate disability Modified Rankin: Moderately severe disability     Balance Overall balance assessment: Needs assistance Sitting-balance support: Feet supported Sitting balance-Leahy Scale: Fair     Standing balance support: Bilateral upper extremity supported, Reliant on assistive device for balance Standing balance-Leahy Scale: Poor Standing balance comment: reliant on UE support                             Pertinent Vitals/Pain Pain Assessment Pain Assessment: No/denies pain    Home Living Family/patient expects to be discharged to:: Private residence Living Arrangements: Children Available Help at Discharge: Family;Available PRN/intermittently Type of Home: House Home Access: Stairs to enter Entrance Stairs-Rails: Right;Left Entrance Stairs-Number of Steps: 3 Alternate Level Stairs-Number of Steps: flight Home Layout: Two level;Able to live on main level with bedroom/bathroom Home Equipment: Rolling Muhanad Torosyan (2 wheels)      Prior Function Prior Level of Function :  Independent/Modified Independent             Mobility Comments: uses RW at home since leaving rehab and ambulating laps in the home.       Hand Dominance        Extremity/Trunk Assessment   Upper Extremity Assessment Upper Extremity Assessment: Defer to OT evaluation    Lower Extremity Assessment Lower Extremity Assessment: Generalized weakness    Cervical / Trunk Assessment Cervical / Trunk Assessment: Kyphotic  Communication   Communication: Prefers language other than English (daughter in room to translate)  Cognition Arousal/Alertness: Awake/alert Behavior During Therapy: WFL for tasks assessed/performed Overall Cognitive Status: Difficult to assess                                 General Comments: difficult to fully assess due to language barrier. Daughter states she seems to be at baseline        General Comments      Exercises     Assessment/Plan    PT Assessment Patient needs continued PT services  PT Problem List Decreased strength;Decreased activity tolerance;Decreased balance;Decreased mobility;Decreased knowledge of use of DME;Decreased safety awareness;Decreased knowledge of precautions       PT Treatment Interventions DME instruction;Gait training;Functional mobility training;Therapeutic activities;Therapeutic exercise;Balance training;Neuromuscular re-education;Patient/family education    PT Goals (Current goals can be found in the Care Plan section)  Acute Rehab PT Goals Patient Stated Goal: did not state PT Goal Formulation: With patient/family Time For Goal Achievement: 08/14/21 Potential to Achieve Goals: Good    Frequency Min 4X/week     Co-evaluation               AM-PAC PT "6 Clicks" Mobility  Outcome Measure Help needed turning from your back to your side while in a flat bed without using bedrails?: A Little Help needed moving from lying on your back to sitting on the side of a flat bed without using  bedrails?: A Little Help needed moving to and from a bed to a chair (including a wheelchair)?: A Little Help needed standing up from a chair using your arms (e.g., wheelchair or bedside chair)?: A Little Help needed to walk in hospital room?: A Little Help needed climbing 3-5 steps with a railing? : A Little 6 Click Score: 18    End of Session Equipment Utilized During Treatment: Gait belt Activity Tolerance: Patient tolerated treatment well Patient left: in chair;with call bell/phone within reach;with chair alarm set Nurse Communication: Mobility status PT Visit Diagnosis: Unsteadiness on feet (R26.81);Other symptoms and signs involving the nervous system (X91.478)    Time: 2956-2130 PT Time Calculation (min) (ACUTE ONLY): 17 min   Charges:   PT Evaluation $PT Eval Moderate Complexity: 1 Mod          Belita Warsame A. Dan Humphreys PT, DPT Acute Rehabilitation Services Pager [phone] Office [phone]   Viviann Spare 07/31/2021, 10:26 AM

## 2021-07-31 NOTE — Progress Notes (Signed)
PROGRESS NOTE    Kim [name]  ZOX:[id number] DOB: 11-23-1945 DOA: 07/30/2021 PCP: Judd LienWebb, Meghan H, PA-C   Brief Narrative:  76 year old [place] female with a history of restless leg syndrome, recent cerebellar stroke December 2022, hypertension, hyperlipidemia presents to the ER today with a sudden onset of right facial droop and right upper extremity weakness.  Patient at home with her daughter-in-law.  Daughter-in-law noticed right facial droop.  Patient was having some difficulty talking.  Patient also had notable right upper extremity weakness. Daughter-in-law activated EMS.  The time patient arrived to the ER, her symptoms had resolved. Initial head CT was negative.  CTA and MRI have been ordered. Neurology consulted. TRH asked to admit patient.   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 -CT head negative.  MRI brain shows 1.5 cm acute ischemic nonhemorrhagic left parietal lobe infarct. -Continue on telemetry -Continue aspirin high-dose statin.  LDL: 77, A1c: 5.9 -Neurology on board-appreciate help.  Plan is for TEE -Continue aspirin, statin, Plavix -Continue PT/OT/SLP -Allow permissive hypertension.  Hypertension: Stable -Hold home medication-losartan, amlodipine and hydralazine at this time. -Allow permissive hypertension  Hyperlipidemia: Continue high dose of statin  Restless leg syndrome: Continue Requip and Cymbalta  History of lupus: -Continue hydroxychloroquine and azathioprine  Anemia of chronic disease: Likely secondary to lupus.  H&H is stable.  Continue to monitor  Hyponatremia: Chronic.  Sodium 134.  Continue to monitor  Hypokalemia: Replenished.  Magnesium level: WNL.  DVT prophylaxis: Heparin Code Status: Full code Family Communication:  None present at bedside.  Plan of care discussed with patient in length and she verbalized understanding and agreed with  it.  I called patient's daughter-in-law and discussed plan of care and she verbalized understanding.  Disposition Plan: Home  Consultants:  Neurology  Procedures:  None  Antimicrobials:  None  Status is: Observation    Subjective: Patient seen and examined.  Sitting comfortably on recliner.  Reports some mild headache however denies any changes in vision, weakness in upper or lower extremities. Objective: Vitals:   07/31/21 0114 07/31/21 0318 07/31/21 0730 07/31/21 1217  BP: (!) 148/72 123/74 (!) 141/78 118/64  Pulse:  68 (!) 53 62  Resp: 18 18 20 20   Temp:  98 F (36.7 C) 97.8 F (36.6 C) 97.7 F (36.5 C)  TempSrc: Oral Oral Oral Oral  SpO2: 98% 98% 100% 97%    Intake/Output Summary (Last 24 hours) at 07/31/2021 1428 Last data filed at 07/31/2021 0910 Gross per 24 hour  Intake 240 ml  Output 250 ml  Net -10 ml   There were no vitals filed for this visit.  Examination:  General exam: Appears calm and comfortable  Respiratory system: Clear to auscultation. Respiratory effort normal. Cardiovascular system: S1 & S2 heard, RRR. No JVD, murmurs, rubs, gallops or clicks. No pedal edema. Gastrointestinal system: Abdomen is nondistended, soft and nontender. No organomegaly or masses felt. Normal bowel sounds heard. Central nervous system: Alert and oriented. No focal neurological deficits. Extremities: Symmetric 5 x 5 power. Skin: No rashes, lesions or ulcers Psychiatry: Judgement and insight appear normal. Mood & affect appropriate.    Data Reviewed: I have personally reviewed following labs and imaging studies  CBC: Recent Labs  Lab 07/30/21 1625 07/31/21 0152  WBC 3.5* 6.3  NEUTROABS 2.1 4.8  HGB 11.7* 11.5*  HCT 35.5* 32.9*  MCV 97.5 93.2  PLT 344 312   Basic Metabolic Panel: Recent  Labs  Lab 07/30/21 1625 07/31/21 0152  NA 134* 134*  K 3.7 3.4*  CL 103 103  CO2 25 24  GLUCOSE 120* 108*  BUN 12 9  CREATININE 0.65 0.64  CALCIUM 9.0 9.3  MG   --  2.1   GFR: CrCl cannot be calculated (Unknown ideal weight.). Liver Function Tests: Recent Labs  Lab 07/30/21 1625 07/31/21 0152  AST 21 19  ALT 14 14  ALKPHOS 65 65  BILITOT 0.4 0.4  PROT 7.4 7.2  ALBUMIN 3.7 3.6   No results for input(s): LIPASE, AMYLASE in the last 168 hours. No results for input(s): AMMONIA in the last 168 hours. Coagulation Profile: No results for input(s): INR, PROTIME in the last 168 hours. Cardiac Enzymes: No results for input(s): CKTOTAL, CKMB, CKMBINDEX, TROPONINI in the last 168 hours. BNP (last 3 results) No results for input(s): PROBNP in the last 8760 hours. HbA1C: No results for input(s): HGBA1C in the last 72 hours. CBG: Recent Labs  Lab 07/30/21 1626  GLUCAP 114*   Lipid Profile: Recent Labs    07/31/21 0152  CHOL 142  HDL 55  LDLCALC 77  TRIG 49  CHOLHDL 2.6   Thyroid Function Tests: No results for input(s): TSH, T4TOTAL, FREET4, T3FREE, THYROIDAB in the last 72 hours. Anemia Panel: No results for input(s): VITAMINB12, FOLATE, FERRITIN, TIBC, IRON, RETICCTPCT in the last 72 hours. Sepsis Labs: No results for input(s): PROCALCITON, LATICACIDVEN in the last 168 hours.  Recent Results (from the past 240 hour(s))  Resp Panel by RT-PCR (Flu A&B, Covid) Nasopharyngeal Swab     Status: None   Collection Time: 07/30/21  4:51 PM   Specimen: Nasopharyngeal Swab; Nasopharyngeal(NP) swabs in vial transport medium  Result Value Ref Range Status   SARS Coronavirus 2 by RT PCR NEGATIVE NEGATIVE Final    Comment: (NOTE) SARS-CoV-2 target nucleic acids are NOT DETECTED.  The SARS-CoV-2 RNA is generally detectable in upper respiratory specimens during the acute phase of infection. The lowest concentration of SARS-CoV-2 viral copies this assay can detect is 138 copies/mL. A negative result does not preclude SARS-Cov-2 infection and should not be used as the sole basis for treatment or other patient management decisions. A negative  result may occur with  improper specimen collection/handling, submission of specimen other than nasopharyngeal swab, presence of viral mutation(s) within the areas targeted by this assay, and inadequate number of viral copies(<138 copies/mL). A negative result must be combined with clinical observations, patient history, and epidemiological information. The expected result is Negative.  Fact Sheet for Patients:  BloggerCourse.com  Fact Sheet for Healthcare Providers:  SeriousBroker.it  This test is no t yet approved or cleared by the [place] FDA and  has been authorized for detection and/or diagnosis of SARS-CoV-2 by FDA under an Emergency Use Authorization (EUA). This EUA will remain  in effect (meaning this test can be used) for the duration of the COVID-19 declaration under Section 564(b)(1) of the Act, 21 U.S.C.section 360bbb-3(b)(1), unless the authorization is terminated  or revoked sooner.       Influenza A by PCR NEGATIVE NEGATIVE Final   Influenza B by PCR NEGATIVE NEGATIVE Final    Comment: (NOTE) The Xpert Xpress SARS-CoV-2/FLU/RSV plus assay is intended as an aid in the diagnosis of influenza from Nasopharyngeal swab specimens and should not be used as a sole basis for treatment. Nasal washings and aspirates are unacceptable for Xpert Xpress SARS-CoV-2/FLU/RSV testing.  Fact Sheet for Patients: BloggerCourse.com  Fact Sheet for  Healthcare Providers: SeriousBroker.it  This test is not yet approved or cleared by the [place] and has been authorized for detection and/or diagnosis of SARS-CoV-2 by FDA under an Emergency Use Authorization (EUA). This EUA will remain in effect (meaning this test can be used) for the duration of the COVID-19 declaration under Section 564(b)(1) of the Act, 21 U.S.C. section 360bbb-3(b)(1), unless the authorization is  terminated or revoked.  Performed at [facility], [address] [place], [place] [address]       Radiology Studies: CT ANGIO HEAD NECK W WO CM  Result Date: 07/30/2021 CLINICAL DATA:  Initial evaluation for acute TIA. EXAM: CT ANGIOGRAPHY HEAD AND NECK TECHNIQUE: Multidetector CT imaging of the head and neck was performed using the standard protocol during bolus administration of intravenous contrast. Multiplanar CT image reconstructions and MIPs were obtained to evaluate the vascular anatomy. Carotid stenosis measurements (when applicable) are obtained utilizing NASCET criteria, using the distal internal carotid diameter as the denominator. RADIATION DOSE REDUCTION: This exam was performed according to the departmental dose-optimization program which includes automated exposure control, adjustment of the mA and/or kV according to patient size and/or use of iterative reconstruction technique. CONTRAST:  75mL OMNIPAQUE IOHEXOL 350 MG/ML SOLN COMPARISON:  CT from earlier the same day as well as prior CTA from 06/26/2021. FINDINGS: CTA NECK FINDINGS Aortic arch: Visualized aortic arch normal in caliber with normal branch pattern. Mild atheromatous change about the arch and origin the great vessels without significant stenosis. Right carotid system: Right common and internal carotid arteries tortuous. Atheromatous change about the right carotid bulb/proximal right ICA with associated stenosis of up to 40% by NASCET criteria. Right ICA patent distally without stenosis or dissection. Left carotid system: Left common and internal carotid arteries are tortuous. Calcified plaque about the left carotid bulb/proximal left ICA without significant stenosis. Left ICA patent distally without stenosis or dissection. Vertebral arteries: Both vertebral arteries arise from subclavian arteries. Right V1 segment duplicated proximally. No significant proximal subclavian artery stenosis. Vertebral arteries tortuous  but patent without stenosis or dissection. Skeleton: No worrisome osseous lesions. Mild-to-moderate spondylosis at C4-5 through C6-7. Patient is edentulous. Other neck: No other acute soft tissue abnormality within the neck. Upper chest: Visualized upper chest demonstrates no acute finding. Review of the MIP images confirms the above findings CTA HEAD FINDINGS Anterior circulation: Petrous segments patent bilaterally. Atheromatous change within the carotid siphons with associated mild multifocal narrowing. 2 mm funnel shaped outpouching arising from the supraclinoid right ICA demonstrates a small vessel emanating from its apex on today's exam, consistent with a small vascular infundibulum (series 6, image 210). A1 segments patent bilaterally. Normal anterior communicating artery complex. Anterior cerebral arteries patent to their distal aspects of stenosis. Normal in stenosis or occlusion. Normal MCA bifurcations. Moderate calcified stenosis involving a proximal left M2 branch again noted, stable. MCA branches well perfused distally without new or progressive finding. Posterior circulation: Focal moderate proximal right V4 stenosis noted (series 6, image 179). Vertebral arteries otherwise patent to the vertebrobasilar junction without stenosis. Both PICA origins patent and normal. Basilar patent to its distal aspect without stenosis. Superior cerebellar arteries patent bilaterally. Mild narrowing at the right P1 P2 junction again noted. Short-segment moderate left P2 stenosis noted, also stable. PCAs remain patent to their distal aspects bilaterally. Venous sinuses: Patent allowing for timing the contrast bolus. Anatomic variants: None significant.  No aneurysm. Review of the MIP images confirms the above findings IMPRESSION: 1. Stable CTA of the  head and neck. No large vessel occlusion. 2. 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 3. Moderate calcified stenosis involving a proximal left M2 branch, with  short-segment moderate left P2 stenoses, stable. 4. Diffuse tortuosity of the major arterial vasculature of the neck, suggesting chronic underlying hypertension. 5. 2 mm outpouching arising from the supraclinoid right ICA has an appearance most consistent with a vascular infundibulum on today's exam. No intracranial aneurysm. Electronically Signed   By: Rise Mu M.D.   On: 07/30/2021 22:14   CT Head Wo Contrast  Result Date: 07/30/2021 CLINICAL DATA:  TIA.  Facial droop. EXAM: CT HEAD WITHOUT CONTRAST TECHNIQUE: Contiguous axial images were obtained from the base of the skull through the vertex without intravenous contrast. RADIATION DOSE REDUCTION: This exam was performed according to the departmental dose-optimization program which includes automated exposure control, adjustment of the mA and/or kV according to patient size and/or use of iterative reconstruction technique. COMPARISON:  06/26/2021 FINDINGS: Brain: No acute intracranial abnormality. Specifically, no hemorrhage, hydrocephalus, mass lesion, acute infarction, or significant intracranial injury. Vascular: No hyperdense vessel or unexpected calcification. Skull: No acute calvarial abnormality. Sinuses/Orbits: No acute findings Other: None IMPRESSION: No acute intracranial abnormality. Electronically Signed   By: Charlett Nose M.D.   On: 07/30/2021 18:30   MR BRAIN WO CONTRAST  Result Date: 07/31/2021 CLINICAL DATA:  Initial evaluation for acute TIA, right-sided weakness. EXAM: MRI HEAD WITHOUT CONTRAST TECHNIQUE: Multiplanar, multiecho pulse sequences of the brain and surrounding structures were obtained without intravenous contrast. COMPARISON:  Prior CTs from 07/30/2021. FINDINGS: Brain: Cerebral volume within normal limits. Patchy T2/FLAIR hyperintensity involving the periventricular deep white matter both cerebral hemispheres most consistent with chronic vessel ischemic disease, mild for age. Few scattered superimposed remote  lacunar infarcts present about the hemispheric cerebral white matter. Remote hemorrhagic left cerebellar infarct noted. Mild diffusion signal abnormality at this location felt to be consistent with artifact. 1.5 cm acute ischemic infarct seen involving the posterior left parietal lobe, postcentral gyrus (series 5, image 84). No associated hemorrhage or mass effect. No other evidence for acute or subacute ischemia. Gray-white matter differentiation otherwise maintained. No acute intracranial hemorrhage. No mass lesion or midline shift. No hydrocephalus or extra-axial fluid collection. Pituitary gland suprasellar region normal. Midline structures intact. Vascular: Major intracranial vascular flow voids are maintained. Skull and upper cervical spine: Craniocervical junction within normal limits. Bone marrow signal intensity normal. No scalp soft tissue abnormality. Sinuses/Orbits: Globes and orbital soft tissues within normal limits. Mild scattered mucosal thickening noted within the ethmoidal air cells. No mastoid effusion. Other: None. IMPRESSION: 1. 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 2. Remote hemorrhagic left cerebellar infarct. 3. Underlying mild chronic microvascular ischemic disease for age. Electronically Signed   By: Rise Mu M.D.   On: 07/31/2021 01:31   DG Chest Portable 1 View  Result Date: 07/30/2021 CLINICAL DATA:  Concern for pneumonia EXAM: PORTABLE CHEST 1 VIEW COMPARISON:  Chest x-ray dated June 26, 2021 FINDINGS: Cardiac and mediastinal contours are unchanged. Small hiatal hernia. 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 Visualized lungs are clear, left costophrenic angle excluded from the field of view. No large pleural effusion or pneumothorax. IMPRESSION: No active disease. Electronically Signed   By: Allegra Lai M.D.   On: 07/30/2021  16:39    Scheduled Meds:   stroke: mapping our early stages of recovery book   Does  not apply Once   amLODipine  5 mg Oral Daily   aspirin EC  81 mg Oral Daily   azaTHIOprine  50 mg Oral Daily   clopidogrel  75 mg Oral Daily   DULoxetine  20 mg Oral Daily   heparin  5,000 Units Subcutaneous Q8H   hydrALAZINE  50 mg Oral BID   hydroxychloroquine  200 mg Oral Daily   losartan  100 mg Oral Daily   magnesium oxide  400 mg Oral BID   rOPINIRole  1 mg Oral QHS   rosuvastatin  40 mg Oral Daily   senna-docusate  1 tablet Oral BID   sodium chloride  1 g Oral TID   traMADol  50 mg Oral BID   Continuous Infusions:   LOS: 0 days   Time spent: 40 minutes   Rhett Mutschler Estill Cotta, MD [facility]  If 7PM-7AM, please contact night-coverage [email or web address] 07/31/2021, 2:28 PM

## 2021-07-31 NOTE — Progress Notes (Addendum)
STROKE TEAM PROGRESS NOTE   ATTENDING NOTE: I reviewed above note and agree with the assessment and plan. Pt was seen and examined.   76 year old female with history of SLE, hypertension, hyperlipidemia, RLS, stroke in 06/2021 admitted for right facial droop, right arm weakness and slurred speech.  Patient had a stroke in 06/2021 with dizziness and nausea vomiting.  CT no acute abnormality.  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  MRI showed left SCA infarct.  EF 60 to 65%.  LDL 73, A1c 5.9.  Discharged on DAPT and Crestor 20.  On this admission, CT no acute abnormality.  CTA head and neck showed right ICA 40% stenosis, left M2 calcified plaque with stenosis, and left P2 stenosis.  MRI showed left frontal small cortical infarct.  LDL 77, A1c pending.  ESR and CRP negative.  ANA pending.  Creatinine 0.65.  LE venous Doppler pending  On exam, patient sitting at edge of the bed, daughter at bedside for interpretation.  She is awake alert, orientated to self and people, not orientated to age or time.  No aphasia, able to name and repeat.  No focal deficit on exam.  Etiology for patient stroke not quite clear, could be related to left M2 stenosis but also could be cardioembolic source with occult A. fib or SLE related Libman-Sacks endocarditis or antiphospholipid syndrome.  Recommend TEE and possible loop recorder if negative TEE, hypercoagulable work-up.  Continue DAPT but increased aspirin to 325 given left M2 stenosis.  Increase Crestor from 20 to 40.  PT/OT recommend home health PT/OT.  We will follow.  For detailed assessment and plan, please refer to above as I have made changes wherever appropriate.   Rosalin Hawking, MD PhD Stroke Neurology 07/31/2021 6:17 PM    INTERVAL HISTORY Patient is seen in her room with no family at the bedside.  Yesterday, she had a transient episode of right sided weakness and facial droop for about 15 minutes.  MRI showed  left parietal lobe infarct.    Vitals:   07/30/21 1930 07/31/21 0114 07/31/21 0318 07/31/21 0730  BP: (!) 165/109 (!) 148/72 123/74 (!) 141/78  Pulse: 83  68 (!) 53  Resp: 17 18 18 20   Temp:   98 F (36.7 C) 97.8 F (36.6 C)  TempSrc:  Oral Oral Oral  SpO2: 97% 98% 98% 100%   CBC:  Recent Labs  Lab 07/30/21 1625 07/31/21 0152  WBC 3.5* 6.3  NEUTROABS 2.1 4.8  HGB 11.7* 11.5*  HCT 35.5* 32.9*  MCV 97.5 93.2  PLT 344 785   Basic Metabolic Panel:  Recent Labs  Lab 07/30/21 1625 07/31/21 0152  NA 134* 134*  K 3.7 3.4*  CL 103 103  CO2 25 24  GLUCOSE 120* 108*  BUN 12 9  CREATININE 0.65 0.64  CALCIUM 9.0 9.3  MG  --  2.1   Lipid Panel:  Recent Labs  Lab 07/31/21 0152  CHOL 142  TRIG 49  HDL 55  CHOLHDL 2.6  VLDL 10  LDLCALC 77   HgbA1c: No results for input(s): HGBA1C in the last 168 hours. Urine Drug Screen: No results for input(s): LABOPIA, COCAINSCRNUR, LABBENZ, AMPHETMU, THCU, LABBARB in the last 168 hours.  Alcohol Level No results for input(s): ETH in the last 168 hours.  IMAGING past 24 hours CT ANGIO HEAD NECK W WO CM  Result Date: 07/30/2021 CLINICAL DATA:  Initial evaluation for acute TIA. EXAM: CT ANGIOGRAPHY  HEAD AND NECK TECHNIQUE: Multidetector CT imaging of the head and neck was performed using the standard protocol during bolus administration of intravenous contrast. Multiplanar CT image reconstructions and MIPs were obtained to evaluate the vascular anatomy. Carotid stenosis measurements (when applicable) are obtained utilizing NASCET criteria, using the distal internal carotid diameter as the denominator. RADIATION DOSE REDUCTION: This exam was performed according to the departmental dose-optimization program which includes automated exposure control, adjustment of the mA and/or kV according to patient size and/or use of iterative reconstruction technique. CONTRAST:  32m OMNIPAQUE IOHEXOL 350 MG/ML SOLN COMPARISON:  CT from earlier the same day  as well as prior CTA from 06/26/2021. FINDINGS: CTA NECK FINDINGS Aortic arch: Visualized aortic arch normal in caliber with normal branch pattern. Mild atheromatous change about the arch and origin the great vessels without significant stenosis. Right carotid system: Right common and internal carotid arteries tortuous. Atheromatous change about the right carotid bulb/proximal right ICA with associated stenosis of up to 40% by NASCET criteria. Right ICA patent distally without stenosis or dissection. Left carotid system: Left common and internal carotid arteries are tortuous. Calcified plaque about the left carotid bulb/proximal left ICA without significant stenosis. Left ICA patent distally without stenosis or dissection. Vertebral arteries: Both vertebral arteries arise from subclavian arteries. Right V1 segment duplicated proximally. No significant proximal subclavian artery stenosis. Vertebral arteries tortuous but patent without stenosis or dissection. Skeleton: No worrisome osseous lesions. Mild-to-moderate spondylosis at C4-5 through C6-7. Patient is edentulous. Other neck: No other acute soft tissue abnormality within the neck. Upper chest: Visualized upper chest demonstrates no acute finding. Review of the MIP images confirms the above findings CTA HEAD FINDINGS Anterior circulation: Petrous segments patent bilaterally. Atheromatous change within the carotid siphons with associated mild multifocal narrowing. 2 mm funnel shaped outpouching arising from the supraclinoid right ICA demonstrates a small vessel emanating from its apex on today's exam, consistent with a small vascular infundibulum (series 6, image 210). A1 segments patent bilaterally. Normal anterior communicating artery complex. Anterior cerebral arteries patent to their distal aspects of stenosis. Normal in stenosis or occlusion. Normal MCA bifurcations. Moderate calcified stenosis involving a proximal left M2 branch again noted, stable. MCA  branches well perfused distally without new or progressive finding. Posterior circulation: Focal moderate proximal right V4 stenosis noted (series 6, image 179). Vertebral arteries otherwise patent to the vertebrobasilar junction without stenosis. Both PICA origins patent and normal. Basilar patent to its distal aspect without stenosis. Superior cerebellar arteries patent bilaterally. Mild narrowing at the right P1 P2 junction again noted. Short-segment moderate left P2 stenosis noted, also stable. PCAs remain patent to their distal aspects bilaterally. Venous sinuses: Patent allowing for timing the contrast bolus. Anatomic variants: None significant.  No aneurysm. Review of the MIP images confirms the above findings IMPRESSION: 1. Stable CTA of the head and neck. No large vessel occlusion. 2. 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 3. Moderate calcified stenosis involving a proximal left M2 branch, with short-segment moderate left P2 stenoses, stable. 4. Diffuse tortuosity of the major arterial vasculature of the neck, suggesting chronic underlying hypertension. 5. 2 mm outpouching arising from the supraclinoid right ICA has an appearance most consistent with a vascular infundibulum on today's exam. No intracranial aneurysm. Electronically Signed   By: BJeannine BogaM.D.   On: 07/30/2021 22:14   CT Head Wo Contrast  Result Date: 07/30/2021 CLINICAL DATA:  TIA.  Facial droop. EXAM: CT HEAD WITHOUT CONTRAST TECHNIQUE: Contiguous axial images were obtained  from the base of the skull through the vertex without intravenous contrast. RADIATION DOSE REDUCTION: This exam was performed according to the departmental dose-optimization program which includes automated exposure control, adjustment of the mA and/or kV according to patient size and/or use of iterative reconstruction technique. COMPARISON:  06/26/2021 FINDINGS: Brain: No acute intracranial abnormality. Specifically, no hemorrhage,  hydrocephalus, mass lesion, acute infarction, or significant intracranial injury. Vascular: No hyperdense vessel or unexpected calcification. Skull: No acute calvarial abnormality. Sinuses/Orbits: No acute findings Other: None IMPRESSION: No acute intracranial abnormality. Electronically Signed   By: Rolm Baptise M.D.   On: 07/30/2021 18:30   MR BRAIN WO CONTRAST  Result Date: 07/31/2021 CLINICAL DATA:  Initial evaluation for acute TIA, right-sided weakness. EXAM: MRI HEAD WITHOUT CONTRAST TECHNIQUE: Multiplanar, multiecho pulse sequences of the brain and surrounding structures were obtained without intravenous contrast. COMPARISON:  Prior CTs from 07/30/2021. FINDINGS: Brain: Cerebral volume within normal limits. Patchy T2/FLAIR hyperintensity involving the periventricular deep white matter both cerebral hemispheres most consistent with chronic vessel ischemic disease, mild for age. Few scattered superimposed remote lacunar infarcts present about the hemispheric cerebral white matter. Remote hemorrhagic left cerebellar infarct noted. Mild diffusion signal abnormality at this location felt to be consistent with artifact. 1.5 cm acute ischemic infarct seen involving the posterior left parietal lobe, postcentral gyrus (series 5, image 84). No associated hemorrhage or mass effect. No other evidence for acute or subacute ischemia. Gray-white matter differentiation otherwise maintained. No acute intracranial hemorrhage. No mass lesion or midline shift. No hydrocephalus or extra-axial fluid collection. Pituitary gland suprasellar region normal. Midline structures intact. Vascular: Major intracranial vascular flow voids are maintained. Skull and upper cervical spine: Craniocervical junction within normal limits. Bone marrow signal intensity normal. No scalp soft tissue abnormality. Sinuses/Orbits: Globes and orbital soft tissues within normal limits. Mild scattered mucosal thickening noted within the ethmoidal air  cells. No mastoid effusion. Other: None. IMPRESSION: 1. 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 2. Remote hemorrhagic left cerebellar infarct. 3. Underlying mild chronic microvascular ischemic disease for age. Electronically Signed   By: Jeannine Boga M.D.   On: 07/31/2021 01:31   DG Chest Portable 1 View  Result Date: 07/30/2021 CLINICAL DATA:  Concern for pneumonia EXAM: PORTABLE CHEST 1 VIEW COMPARISON:  Chest x-ray dated June 26, 2021 FINDINGS: Cardiac and mediastinal contours are unchanged. Small hiatal hernia. 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 Visualized lungs are clear, left costophrenic angle excluded from the field of view. No large pleural effusion or pneumothorax. IMPRESSION: No active disease. Electronically Signed   By: Yetta Glassman M.D.   On: 07/30/2021 16:39    PHYSICAL EXAM General: Alert, well-developed, well-nourished patient in no acute distress   NEURO:  Mental Status: AA&Ox3  Speech/Language: speech is without dysarthria or aphasia.    Cranial Nerves:  II: PERRL. Visual fields full.  III, IV, VI: EOMI. Eyelids elevate symmetrically.  V: Sensation is intact to light touch and symmetrical to face.  VII: Smile is symmetrical.  VIII: hearing intact to voice. IX, X:  Phonation is normal.  XII: tongue is midline without fasciculations. Motor: 5/5 strength to all muscle groups tested.  Tone: is normal and bulk is normal Sensation- Intact to light touch bilaterally.   Coordination: FTN intact bilaterally. No drift.  Gait- deferred   ASSESSMENT/PLAN Ms. Tawnya Naser is a 76 y.o. female with history of HTN, HLD and  SLE presenting with a transient episode of right sided weakness and facial droop for about 15 minutes.  MRI showed left parietal lobe infarct.  Will plan for TEE to further evaluate for cardioembolic source of  stroke.  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 Left M2 stenosis  Code Stroke CT head No acute abnormality. ASPECTS 10.    CTA head & neck stable CTA with no LVO, 40% stenosis of proximal right ICA, left M2 stenosis due to calcified plaque, left P2 stenosis MRI  1.5 cm acute ischemic left parietal lobe infarct, remote hemorrhagic left cerebellar infarct 2D Echo EF 09-64%, grade 1 diastolic dysfunction, interatrial septum not well visualized LE venous Doppler pending Given history of SLE, will do TEE to rule out marantic endocarditis.  If negative will consider loop recorder Given history of SLE, will check antiphospholipid antibodies. LDL 77 HgbA1c 5.9 VTE prophylaxis - SQH aspirin 81 mg daily and clopidogrel 75 mg daily prior to admission, now on aspirin 325 mg daily and clopidogrel 75 mg daily for 3 months and then aspirin alone given left M2 stenosis.  Therapy recommendations:  home health PT Disposition:  pending  History of stroke 06/2021 admitted for dizziness and nausea vomiting.  CT no acute abnormality.  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  MRI showed left SCA infarct.  EF 60 to 65%.  LDL 73, A1c 5.9.  Discharged on DAPT and Crestor 20  SLE Continue home Imuran and hydroxychloroquine  will do TEE to rule out marantic endocarditis.  will check antiphospholipid antibodies.  Hypertension Home meds:  losartan 100 mg daily, valsartan 320 mg daily Stable Permissive hypertension (OK if < 220/120) but gradually normalize in 5-7 days Long-term BP goal normotensive  Hyperlipidemia Home meds:  rosuvastatin 20 mg daily, resumed in hospital LDL 77, goal < 70 Increase rosuvastatin to 40 mg daily Continue statin at discharge  Other Stroke Risk Factors Advanced Age >/= 33  Hx stroke  Other Active Problems RLS  Hospital day # [place] , MSN, AGACNP-BC [facility] See Amion for schedule and pager information 07/31/2021 12:02 PM    To contact Stroke Continuity provider, please refer to [email or web address] After hours, contact General Neurology

## 2021-07-31 NOTE — Progress Notes (Signed)
SLP Cancellation Note  Patient Details Name: Kim [name] MRN: [id number] DOB: Apr 23, 1946   Cancelled treatment:       Reason Eval/Treat Not Completed: Other (comment). Spoke with PT who reported that daughter translates for patient as dialect is not available via Optometrist services. Daughter has gone this am but plans to return this pm. Will re attempt as able.   Kierra Jezewski MA, CCC-SLP    Daizha Anand Meryl 07/31/2021, 10:07 AM

## 2021-07-31 NOTE — Evaluation (Signed)
Occupational Therapy Evaluation Patient Details Name: Kim BollSurinder Bunnell MRN: [id number] DOB: 11/06/1945 Today's Date: 07/31/2021   History of Present Illness 76 y/o female presented to ED on 07/30/21 with R sided weakness and facial droop lasting 15 minutes. MRI showed 1.5 cm acute ischemic nonhemorrhagic L parietal lobe infarct and remote hemorrhagic L cerebellar infarct. PMH: hypertension, hyperlipidemia, restless leg syndrome and osteoporosis   Clinical Impression   Pt admitted with the above listed diagnosis and demonstrates the below listed deficits.  She is able to perform ADLs with supervision - min guard assist, and is progressing toward her pre admission status.  She lives with her daughter in law and other family members, and was able to complete ADLs mod I.   Pt was still  receiving HHOT and PT due to prior stroke.  Recommend continued HHOT at discharge, no other acute OT needs identified at this time, and acute OT will sign off.      Recommendations for follow up therapy are one component of a multi-disciplinary discharge planning process, led by the attending physician.  Recommendations may be updated based on patient status, additional functional criteria and insurance authorization.   Follow Up Recommendations  Home health OT    Assistance Recommended at Discharge Intermittent Supervision/Assistance  Patient can return home with the following Assistance with cooking/housework;Assist for transportation    Functional Status Assessment  Patient has had a recent decline in their functional status and demonstrates the ability to make significant improvements in function in a reasonable and predictable amount of time.  Equipment Recommendations  None recommended by OT    Recommendations for Other Services       Precautions / Restrictions Precautions Precautions: Fall      Mobility Bed Mobility Overal bed mobility: Modified Independent                  Transfers                           Balance Overall balance assessment: Needs assistance Sitting-balance support: Feet supported Sitting balance-Leahy Scale: Good     Standing balance support: During functional activity Standing balance-Leahy Scale: Fair                             ADL either performed or assessed with clinical judgement   ADL Overall ADL's : Needs assistance/impaired Eating/Feeding: Independent;Sitting   Grooming: Wash/dry hands;Wash/dry face;Oral care;Supervision/safety;Standing   Upper Body Bathing: Set up;Supervision/ safety;Sitting;Standing   Lower Body Bathing: Min guard;Sit to/from stand   Upper Body Dressing : Set up;Supervision/safety;Sitting   Lower Body Dressing: Min guard;Sit to/from stand   Toilet Transfer: Min guard;Ambulation;Comfort height toilet;Rolling walker (2 wheels)   Toileting- Clothing Manipulation and Hygiene: Min guard;Sit to/from stand       Functional mobility during ADLs: Min guard;Rolling walker (2 wheels)       Vision Patient Visual Report: No change from baseline Additional Comments: not formally assessed due to language deficits, but no apparent deficits noted     Perception     Praxis      Pertinent Vitals/Pain Pain Assessment Pain Assessment: No/denies pain     Hand Dominance Right   Extremity/Trunk Assessment Upper Extremity Assessment Upper Extremity Assessment: LUE deficits/detail LUE Deficits / Details: Lt shoulder limited to 80-90* flexion.  Daughter in law reports this is baseline for pt PROM limited to 6990*   Lower Extremity Assessment Lower  Extremity Assessment: Defer to PT evaluation   Cervical / Trunk Assessment Cervical / Trunk Assessment: Kyphotic   Communication Communication Communication: Prefers language other than English (daughter in room to translate)   Cognition Arousal/Alertness: Awake/alert Behavior During Therapy: WFL for tasks assessed/performed Overall Cognitive  Status: Difficult to assess                                 General Comments: difficult to fully assess due to language barrier. Daughter in law states she seems to be at baseline     General Comments  daughter in law reports pt is close to her pre admission status.  She reports pt was still receiving PT and OT from previous CVA    Exercises     Shoulder Instructions      Home Living Family/patient expects to be discharged to:: Private residence Living Arrangements: Children Available Help at Discharge: Family;Available PRN/intermittently Type of Home: House Home Access: Stairs to enter [employer] of Steps: 3 Entrance Stairs-Rails: Right;Left Home Layout: Two level;Able to live on main level with bedroom/bathroom Alternate Level Stairs-Number of Steps: flight   Bathroom Shower/Tub: Chief Strategy Officer: Standard Bathroom Accessibility: No   Home Equipment: Agricultural consultant (2 wheels)          Prior Functioning/Environment Prior Level of Function : Independent/Modified Independent             Mobility Comments: uses RW at home since leaving rehab and ambulating laps in the home. ADLs Comments: daughter in law reports pt is generally independent with ADLs.        OT Problem List: Decreased activity tolerance;Impaired balance (sitting and/or standing)      OT Treatment/Interventions:      OT Goals(Current goals can be found in the care plan section) Acute Rehab OT Goals Patient Stated Goal: to go back home OT Goal Formulation: All assessment and education complete, DC therapy  OT Frequency:      Co-evaluation              AM-PAC OT "6 Clicks" Daily Activity     Outcome Measure Help from another person eating meals?: None Help from another person taking care of personal grooming?: A Little Help from another person toileting, which includes using toliet, bedpan, or urinal?: A Little Help from another person bathing  (including washing, rinsing, drying)?: A Little Help from another person to put on and taking off regular upper body clothing?: A Little Help from another person to put on and taking off regular lower body clothing?: A Little 6 Click Score: 19   End of Session Equipment Utilized During Treatment: Rolling walker (2 wheels) Nurse Communication: Mobility status  Activity Tolerance:   Patient left: in bed;with call bell/phone within reach  OT Visit Diagnosis: Unsteadiness on feet (R26.81);Other abnormalities of gait and mobility (R26.89);Muscle weakness (generalized) (M62.81);Pain                Time: 1345-1421 OT Time Calculation (min): 36 min Charges:  OT General Charges $OT Visit: 1 Visit OT Evaluation $OT Eval Moderate Complexity: 1 Mod OT Treatments $Self Care/Home Management : 8-22 mins  Eber Jones OTR/L Acute Rehabilitation Services Pager [phone] Office [phone]   Jeani Hawking M 07/31/2021, 2:33 PM

## 2021-07-31 NOTE — Consult Note (Signed)
Neurology Consultation Reason for Consult: Right-sided weakness Referring Physician: Bridgett Larsson, E  CC: Right-sided weakness  History is obtained from: Patient's daughter, patient (with daughters help as Optometrist)  HPI: Kim [name] is a 76 y.o. female with a history of hypertension, hyperlipidemia who was in her normal state of health until 2 PM.  She then had a transient episode of right-sided weakness and facial droop.  This lasted for approximately 15 minutes and then improved.  She still has very mild right hand weakness, but is almost back to baseline.   LKW: 2 PM tpa given?: no, mild symptoms    Past Medical History:  Diagnosis Date   Arthritis    Depression    Essential hypertension 06/28/2021   Mixed hyperlipidemia 06/28/2021     Family History  Problem Relation Age of Onset   Heart disease Neg Hx      Social History:  reports that she has never smoked. She has never used smokeless tobacco. She reports that she does not drink alcohol and does not use drugs.   Exam: Current vital signs: BP 123/74 (BP Location: Right Arm)    Pulse 68    Temp 98 F (36.7 C) (Oral)    Resp 18    SpO2 98%  Vital signs in last 24 hours: Temp:  [97.7 F (36.5 C)-98 F (36.7 C)] 98 F (36.7 C) (01/21 0318) Pulse Rate:  [64-83] 68 (01/21 0318) Resp:  [13-22] 18 (01/21 0318) BP: (123-165)/(72-109) 123/74 (01/21 0318) SpO2:  [97 %-99 %] 98 % (01/21 0318)   Physical Exam  Constitutional: Appears well-developed and well-nourished.  Psych: Affect appropriate to situation Eyes: No scleral injection HENT: No OP obstruction MSK: no joint deformities.  Cardiovascular: Normal rate and regular rhythm.  Respiratory: Effort normal, non-labored breathing GI: Soft.  No distension. There is no tenderness.  Skin: WDI  Neuro: Mental Status: Patient is awake, alert, oriented to person, place, she is unable to give the month or year patient is able to give a clear and coherent history. No  signs of aphasia or neglect Cranial Nerves: II: Visual Fields are full. Pupils are equal, round, and reactive to light.   III,IV, VI: EOMI without ptosis or diploplia.  V: Facial sensation is symmetric to temperature VII: Facial movement is symmetric.  VIII: hearing is intact to voice X: Uvula elevates symmetrically XI: Shoulder shrug is symmetric. XII: tongue is midline without atrophy or fasciculations.  Motor: Tone is normal. Bulk is normal. 5/5 strength was present in left arm and both legs, mild weakness of the right upper extremity Sensory: Sensation is symmetric to light touch and temperature in the arms and legs. Cerebellar: FNF intact bilaterally      I have reviewed labs in epic and the results pertinent to this consultation are: CMP-unremarkable LDL 77  I have reviewed the images obtained: MRI brain-distal cortical infarct CTA-no carotid stenosis, some large vessel atheroma  Impression: 76 year old female with a history of hypertension, hyperlipidemia who presents with small embolic appearing stroke.  Her LDL is slightly above goal and may benefit from increase statin dose.  Recommendations: 1) increase rosuvastatin from 20-40 2) echocardiogram 3) consider long-term cardiac monitoring 4) continue aspirin Plavix 5) PT, OT, ST 6) stroke team to follow   Roland Rack, MD [facility] [phone]  If 7pm- 7am, please page neurology on call as listed in [place].

## 2021-08-01 ENCOUNTER — Inpatient Hospital Stay (HOSPITAL_COMMUNITY): Payer: Medicare Other

## 2021-08-01 DIAGNOSIS — I639 Cerebral infarction, unspecified: Secondary | ICD-10-CM

## 2021-08-01 DIAGNOSIS — I63411 Cerebral infarction due to embolism of right middle cerebral artery: Secondary | ICD-10-CM

## 2021-08-01 LAB — BASIC METABOLIC PANEL
Anion gap: 8 (ref 5–15)
BUN: 16 mg/dL (ref 8–23)
CO2: 23 mmol/L (ref 22–32)
Calcium: 8.9 mg/dL (ref 8.9–10.3)
Chloride: 104 mmol/L (ref 98–111)
Creatinine, Ser: 0.62 mg/dL (ref 0.44–1.00)
GFR, Estimated: >60 mL/min (ref >60)
Glucose, Bld: 95 mg/dL (ref 70–99)
Potassium: 4.1 mmol/L (ref 3.5–5.1)
Sodium: 135 mmol/L (ref 135–145)

## 2021-08-01 MED ORDER — SODIUM CHLORIDE 0.9 % IV SOLN: INTRAVENOUS | Status: DC

## 2021-08-01 NOTE — Progress Notes (Signed)
° ° °  [facility] has been requested to perform a transesophageal echocardiogram on Kim [name] for stroke.  After careful review of history and examination, the risks and benefits of transesophageal echocardiogram have been explained including risks of esophageal damage, perforation (1:10,000 risk), bleeding, pharyngeal hematoma as well as other potential complications associated with conscious sedation including aspiration, arrhythmia, respiratory failure and death. Alternatives to treatment were discussed, questions were answered. Patient is willing to proceed.   Patient does not speak any [place]. Daughter-in-law served as Nurse, learning disability. Daughter-in-law also states a provider came by earlier and explained procedure in patient's native language. Patient unable to sign consent form as she does not know how to write her name. Daughter-in-law signed formed in presence of patient and I witnessed this.  Corrin Parker, PA-C  08/01/2021 1:24 PM

## 2021-08-01 NOTE — Plan of Care (Signed)
Pt is alert oriented x 3, pts daughter present at bedside. Pt ambulates to bathroom with walker.    Problem: Education: Goal: Knowledge of disease or condition will improve Outcome: Progressing Goal: Knowledge of secondary prevention will improve (SELECT ALL) Outcome: Progressing   Problem: Self-Care: Goal: Ability to participate in self-care as condition permits will improve Outcome: Progressing   Problem: Ischemic Stroke/TIA Tissue Perfusion: Goal: Complications of ischemic stroke/TIA will be minimized Outcome: Progressing

## 2021-08-01 NOTE — Progress Notes (Signed)
PROGRESS NOTE    Kim [name]  YA:[id number] DOB: [birth year] DOA: 07/30/2021 PCP: Curt Jews, PA-C   Brief Narrative:  76 year old [place] female with a history of restless leg syndrome, recent cerebellar stroke December 2022, hypertension, hyperlipidemia presents to the ER today with a sudden onset of right facial droop and right upper extremity weakness.  Patient at home with her daughter-in-law.  Daughter-in-law noticed right facial droop.  Patient was having some difficulty talking.  Patient also had notable right upper extremity weakness. Daughter-in-law activated EMS.  The time patient arrived to the ER, her symptoms had resolved. Initial head CT was negative.  CTA and MRI have been ordered. Neurology consulted. [facility] asked to admit patient.   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 -CT head negative.  MRI brain shows 1.5 cm acute ischemic nonhemorrhagic left parietal lobe infarct. -Continue on telemetry -Continue aspirin high-dose statin.  LDL: 77, A1c: 5.9 -Neurology on board-appreciate help.   -Plan is for TEE with possible loop recorder placement-cardiology has been consulted. -Echo shows ejection fraction of 60% with grade 1 diastolic dysfunction. -Continue aspirin, statin, Plavix -Continue PT/OT-recommend home health PT/OT -Allow permissive hypertension. -Doppler ultrasound of bilateral lower extremity pending.  Hypertension:  -Hold home medication-losartan, amlodipine and hydralazine at this time. -Allow permissive hypertension  Hyperlipidemia: Continue high dose of statin  Restless leg syndrome: Continue Requip and Cymbalta  History of lupus: -Continue hydroxychloroquine and azathioprine -CRP and sed rate: WNL.   -Antiphospholipid antibody, ANA, double-stranded DNA, anti-smooth antibody, homocystine, cardiolipin antibody, lupus anticoagulation all labs are  pending. -TEE to rule out endocarditis  Anemia of chronic disease: Likely secondary to lupus.  H&H is stable.  Continue to monitor  Hyponatremia: Sodium 135 this morning.  Hypokalemia: Resolved  DVT prophylaxis: Heparin Code Status: Full code Family Communication: Patient's daughter-in-law present at bedside.  Plan of [name] discussed with patient and her daughter-in-law in length and she verbalized understanding and agreed with it.  Disposition Plan: Home with home health services  Consultants:  Neurology Cardiology  Procedures:  None  Antimicrobials:  None  Status is: Observation    Subjective: Patient seen and examined.  Sitting comfortably on recliner.  Denies headache, slurred speech, facial drop, lightheadedness, dizziness, chest pain or weakness.  No acute events overnight.  Objective: Vitals:   07/31/21 2305 08/01/21 0357 08/01/21 0738 08/01/21 1223  BP: (!) 150/82 (!) 164/80 (!) 165/88 (!) 164/85  Pulse: (!) 55 60 (!) 53 (!) 57  Resp: 18 18 18 18   Temp: 97.6 F (36.4 C) 97.6 F (36.4 C) 97.8 F (36.6 C) 98.1 F (36.7 C)  TempSrc: Oral Oral Oral Oral  SpO2: 96% 95% 99% 99%    Intake/Output Summary (Last 24 hours) at 08/01/2021 1439 Last data filed at 07/31/2021 2113 Gross per 24 hour  Intake --  Output 400 ml  Net -400 ml    There were no vitals filed for this visit.  Examination:  General exam: Appears calm and comfortable, elderly female, communicating well Respiratory system: Clear to auscultation. Respiratory effort normal. Cardiovascular system: S1 & S2 heard, RRR. No JVD, murmurs, rubs, gallops or clicks. No pedal edema. Gastrointestinal system: Abdomen is nondistended, soft and nontender. No organomegaly or masses felt. Normal bowel sounds heard. Central nervous system: Alert and oriented. No focal neurological deficits. Extremities: Symmetric 5 x 5 power. Skin: No rashes, lesions or ulcers Psychiatry: Judgement and insight appear normal.  Mood &  affect appropriate.    Data Reviewed: I have personally reviewed following labs and imaging studies  CBC: Recent Labs  Lab 07/30/21 1625 07/31/21 0152  WBC 3.5* 6.3  NEUTROABS 2.1 4.8  HGB 11.7* 11.5*  HCT 35.5* 32.9*  MCV 97.5 93.2  PLT 344 [id number]    Basic Metabolic Panel: Recent Labs  Lab 07/30/21 1625 07/31/21 0152 08/01/21 0227  NA 134* 134* 135  K 3.7 3.4* 4.1  CL 103 103 104  CO2 25 24 23   GLUCOSE 120* 108* 95  BUN 12 9 16   CREATININE 0.65 0.64 0.62  CALCIUM 9.0 9.3 8.9  MG  --  2.1  --     GFR: CrCl cannot be calculated (Unknown ideal weight.). Liver Function Tests: Recent Labs  Lab 07/30/21 1625 07/31/21 0152  AST 21 19  ALT 14 14  ALKPHOS 65 65  BILITOT 0.4 0.4  PROT 7.4 7.2  ALBUMIN 3.7 3.6    No results for input(s): LIPASE, AMYLASE in the last 168 hours. No results for input(s): AMMONIA in the last 168 hours. Coagulation Profile: No results for input(s): INR, PROTIME in the last 168 hours. Cardiac Enzymes: No results for input(s): CKTOTAL, CKMB, CKMBINDEX, TROPONINI in the last 168 hours. BNP (last 3 results) No results for input(s): PROBNP in the last 8760 hours. HbA1C: No results for input(s): HGBA1C in the last 72 hours. CBG: Recent Labs  Lab 07/30/21 1626  GLUCAP 114*    Lipid Profile: Recent Labs    07/31/21 0152  CHOL 142  HDL 55  LDLCALC 77  TRIG 49  CHOLHDL 2.6    Thyroid Function Tests: No results for input(s): TSH, T4TOTAL, FREET4, T3FREE, THYROIDAB in the last 72 hours. Anemia Panel: No results for input(s): VITAMINB12, FOLATE, FERRITIN, TIBC, IRON, RETICCTPCT in the last 72 hours. Sepsis Labs: No results for input(s): PROCALCITON, LATICACIDVEN in the last 168 hours.  Recent Results (from the past 240 hour(s))  Resp Panel by RT-PCR (Flu A&B, Covid) Nasopharyngeal Swab     Status: None   Collection Time: 07/30/21  4:51 PM   Specimen: Nasopharyngeal Swab; Nasopharyngeal(NP) swabs in vial transport medium   Result Value Ref Range Status   SARS Coronavirus 2 by RT PCR NEGATIVE NEGATIVE Final    Comment: (NOTE) SARS-CoV-2 target nucleic acids are NOT DETECTED.  The SARS-CoV-2 RNA is generally detectable in upper respiratory specimens during the acute phase of infection. The lowest concentration of SARS-CoV-2 viral copies this assay can detect is 138 copies/mL. A negative result does not preclude SARS-Cov-2 infection and should not be used as the sole basis for treatment or other patient management decisions. A negative result may occur with  improper specimen collection/handling, submission of specimen other than nasopharyngeal swab, presence of viral mutation(s) within the areas targeted by this assay, and inadequate number of viral copies(<138 copies/mL). A negative result must be combined with clinical observations, patient history, and epidemiological information. The expected result is Negative.  Fact Sheet for Patients:  EntrepreneurPulse.com.au  Fact Sheet for Healthcare Providers:  IncredibleEmployment.be  This test is no t yet approved or cleared by the [place] FDA and  has been authorized for detection and/or diagnosis of SARS-CoV-2 by FDA under an Emergency Use Authorization (EUA). This EUA will remain  in effect (meaning this test can be used) for the duration of the COVID-19 declaration under Section 564(b)(1) of the Act, 21 U.S.C.section 360bbb-3(b)(1), unless the authorization is terminated  or revoked sooner.  Influenza A by PCR NEGATIVE NEGATIVE Final   Influenza B by PCR NEGATIVE NEGATIVE Final    Comment: (NOTE) The Xpert Xpress SARS-CoV-2/FLU/RSV plus assay is intended as an aid in the diagnosis of influenza from Nasopharyngeal swab specimens and should not be used as a sole basis for treatment. Nasal washings and aspirates are unacceptable for Xpert Xpress SARS-CoV-2/FLU/RSV testing.  Fact Sheet for  Patients: EntrepreneurPulse.com.au  Fact Sheet for Healthcare Providers: IncredibleEmployment.be  This test is not yet approved or cleared by the [place] FDA and has been authorized for detection and/or diagnosis of SARS-CoV-2 by FDA under an Emergency Use Authorization (EUA). This EUA will remain in effect (meaning this test can be used) for the duration of the COVID-19 declaration under Section 564(b)(1) of the Act, 21 U.S.C. section 360bbb-3(b)(1), unless the authorization is terminated or revoked.  Performed at [facility], [place] [address] [place], [place] [address]        Radiology Studies: CT ANGIO HEAD NECK W WO CM  Result Date: 07/30/2021 CLINICAL DATA:  Initial evaluation for acute TIA. EXAM: CT ANGIOGRAPHY HEAD AND NECK TECHNIQUE: Multidetector CT imaging of the head and neck was performed using the standard protocol during bolus administration of intravenous contrast. Multiplanar CT image reconstructions and MIPs were obtained to evaluate the vascular anatomy. Carotid stenosis measurements (when applicable) are obtained utilizing NASCET criteria, using the distal internal carotid diameter as the denominator. RADIATION DOSE REDUCTION: This exam was performed according to the departmental dose-optimization program which includes automated exposure control, adjustment of the mA and/or kV according to patient size and/or use of iterative reconstruction technique. CONTRAST:  15mL OMNIPAQUE IOHEXOL 350 MG/ML SOLN COMPARISON:  CT from earlier the same day as well as prior CTA from 06/26/2021. FINDINGS: CTA NECK FINDINGS Aortic arch: Visualized aortic arch normal in caliber with normal branch pattern. Mild atheromatous change about the arch and origin the great vessels without significant stenosis. Right carotid system: Right common and internal carotid arteries tortuous. Atheromatous change about the right carotid bulb/proximal right ICA  with associated stenosis of up to 40% by NASCET criteria. Right ICA patent distally without stenosis or dissection. Left carotid system: Left common and internal carotid arteries are tortuous. Calcified plaque about the left carotid bulb/proximal left ICA without significant stenosis. Left ICA patent distally without stenosis or dissection. Vertebral arteries: Both vertebral arteries arise from subclavian arteries. Right V1 segment duplicated proximally. No significant proximal subclavian artery stenosis. Vertebral arteries tortuous but patent without stenosis or dissection. Skeleton: No worrisome osseous lesions. Mild-to-moderate spondylosis at C4-5 through C6-7. Patient is edentulous. Other neck: No other acute soft tissue abnormality within the neck. Upper chest: Visualized upper chest demonstrates no acute finding. Review of the MIP images confirms the above findings CTA HEAD FINDINGS Anterior circulation: Petrous segments patent bilaterally. Atheromatous change within the carotid siphons with associated mild multifocal narrowing. 2 mm funnel shaped outpouching arising from the supraclinoid right ICA demonstrates a small vessel emanating from its apex on today's exam, consistent with a small vascular infundibulum (series 6, image 210). A1 segments patent bilaterally. Normal anterior communicating artery complex. Anterior cerebral arteries patent to their distal aspects of stenosis. Normal in stenosis or occlusion. Normal MCA bifurcations. Moderate calcified stenosis involving a proximal left M2 branch again noted, stable. MCA branches well perfused distally without new or progressive finding. Posterior circulation: Focal moderate proximal right V4 stenosis noted (series 6, image 179). Vertebral arteries otherwise patent to the vertebrobasilar junction without stenosis. Both PICA  origins patent and normal. Basilar patent to its distal aspect without stenosis. Superior cerebellar arteries patent bilaterally. Mild  narrowing at the right P1 P2 junction again noted. Short-segment moderate left P2 stenosis noted, also stable. PCAs remain patent to their distal aspects bilaterally. Venous sinuses: Patent allowing for timing the contrast bolus. Anatomic variants: None significant.  No aneurysm. Review of the MIP images confirms the above findings IMPRESSION: 1. Stable CTA of the head and neck. No large vessel occlusion. 2. 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 3. Moderate calcified stenosis involving a proximal left M2 branch, with short-segment moderate left P2 stenoses, stable. 4. Diffuse tortuosity of the major arterial vasculature of the neck, suggesting chronic underlying hypertension. 5. 2 mm outpouching arising from the supraclinoid right ICA has an appearance most consistent with a vascular infundibulum on today's exam. No intracranial aneurysm. Electronically Signed   By: Jeannine Boga M.D.   On: 07/30/2021 22:14   CT Head Wo Contrast  Result Date: 07/30/2021 CLINICAL DATA:  TIA.  Facial droop. EXAM: CT HEAD WITHOUT CONTRAST TECHNIQUE: Contiguous axial images were obtained from the base of the skull through the vertex without intravenous contrast. RADIATION DOSE REDUCTION: This exam was performed according to the departmental dose-optimization program which includes automated exposure control, adjustment of the mA and/or kV according to patient size and/or use of iterative reconstruction technique. COMPARISON:  06/26/2021 FINDINGS: Brain: No acute intracranial abnormality. Specifically, no hemorrhage, hydrocephalus, mass lesion, acute infarction, or significant intracranial injury. Vascular: No hyperdense vessel or unexpected calcification. Skull: No acute calvarial abnormality. Sinuses/Orbits: No acute findings Other: None IMPRESSION: No acute intracranial abnormality. Electronically Signed   By: Rolm Baptise M.D.   On: 07/30/2021 18:30   MR BRAIN WO CONTRAST  Result Date:  07/31/2021 CLINICAL DATA:  Initial evaluation for acute TIA, right-sided weakness. EXAM: MRI HEAD WITHOUT CONTRAST TECHNIQUE: Multiplanar, multiecho pulse sequences of the brain and surrounding structures were obtained without intravenous contrast. COMPARISON:  Prior CTs from 07/30/2021. FINDINGS: Brain: Cerebral volume within normal limits. Patchy T2/FLAIR hyperintensity involving the periventricular deep white matter both cerebral hemispheres most consistent with chronic vessel ischemic disease, mild for age. Few scattered superimposed remote lacunar infarcts present about the hemispheric cerebral white matter. Remote hemorrhagic left cerebellar infarct noted. Mild diffusion signal abnormality at this location felt to be consistent with artifact. 1.5 cm acute ischemic infarct seen involving the posterior left parietal lobe, postcentral gyrus (series 5, image 84). No associated hemorrhage or mass effect. No other evidence for acute or subacute ischemia. Gray-white matter differentiation otherwise maintained. No acute intracranial hemorrhage. No mass lesion or midline shift. No hydrocephalus or extra-axial fluid collection. Pituitary gland suprasellar region normal. Midline structures intact. Vascular: Major intracranial vascular flow voids are maintained. Skull and upper cervical spine: Craniocervical junction within normal limits. Bone marrow signal intensity normal. No scalp soft tissue abnormality. Sinuses/Orbits: Globes and orbital soft tissues within normal limits. Mild scattered mucosal thickening noted within the ethmoidal air cells. No mastoid effusion. Other: None. IMPRESSION: 1. 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 2. Remote hemorrhagic left cerebellar infarct. 3. Underlying mild chronic microvascular ischemic disease for age. Electronically Signed   By: Jeannine Boga M.D.   On: 07/31/2021 01:31   DG Chest Portable 1 View  Result Date: 07/30/2021 CLINICAL DATA:   Concern for pneumonia EXAM: PORTABLE CHEST 1 VIEW COMPARISON:  Chest x-ray dated June 26, 2021 FINDINGS: Cardiac and mediastinal contours are unchanged. Small hiatal hernia. Eventration of the  diaphragm. 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 Visualized lungs are clear, left costophrenic angle excluded from the field of view. No large pleural effusion or pneumothorax. IMPRESSION: No active disease. Electronically Signed   By: Yetta Glassman M.D.   On: 07/30/2021 16:39    Scheduled Meds:   stroke: mapping our early stages of recovery book   Does not apply Once   aspirin EC  325 mg Oral Daily   azaTHIOprine  50 mg Oral Daily   clopidogrel  75 mg Oral Daily   DULoxetine  20 mg Oral Daily   heparin  5,000 Units Subcutaneous Q8H   hydroxychloroquine  200 mg Oral Daily   magnesium oxide  400 mg Oral BID   rOPINIRole  1 mg Oral QHS   rosuvastatin  40 mg Oral Daily   senna-docusate  1 tablet Oral BID   sodium chloride  1 g Oral TID   traMADol  50 mg Oral BID   Continuous Infusions:  [START ON 08/02/2021] sodium chloride       LOS: 1 day   Time spent: 40 minutes   Liela Rylee Loann Quill, MD [facility]  If 7PM-7AM, please contact night-coverage [email or web address] 08/01/2021, 2:39 PM

## 2021-08-01 NOTE — Progress Notes (Signed)
Bilateral lower extremity venous duplex completed. Refer to "CV Proc" under chart review to view preliminary results.  08/01/2021 3:12 PM Eula FriedMichelle S., MHA, RVT, RDCS, RDMS

## 2021-08-01 NOTE — Progress Notes (Signed)
Attempted lower extremity venous duplex, however patient is eating. Will attempt again as schedule permits.  08/01/2021 9:56 AM Eula Fried., MHA, RVT, RDCS, RDMS

## 2021-08-01 NOTE — Progress Notes (Addendum)
STROKE TEAM PROGRESS NOTE   INTERVAL HISTORY Patient is seen in her room with her daughter at the bedside, who interprets for her.  She has been hemodynamically stable and her neurological exam is stable.  Plan is for her to undergo TEE with possible loop recorder placement. Vitals:   07/31/21 1919 07/31/21 2305 08/01/21 0357 08/01/21 0738  BP: 126/70 (!) 150/82 (!) 164/80 (!) 165/88  Pulse: (!) 55 (!) 55 60 (!) 53  Resp:  18 18 18   Temp: 98 F (36.7 C) 97.6 F (36.4 C) 97.6 F (36.4 C) 97.8 F (36.6 C)  TempSrc: Oral Oral Oral Oral  SpO2: 96% 96% 95% 99%   CBC:  Recent Labs  Lab 07/30/21 1625 07/31/21 0152  WBC 3.5* 6.3  NEUTROABS 2.1 4.8  HGB 11.7* 11.5*  HCT 35.5* 32.9*  MCV 97.5 93.2  PLT 344 123456    Basic Metabolic Panel:  Recent Labs  Lab 07/31/21 0152 08/01/21 0227  NA 134* 135  K 3.4* 4.1  CL 103 104  CO2 24 23  GLUCOSE 108* 95  BUN 9 16  CREATININE 0.64 0.62  CALCIUM 9.3 8.9  MG 2.1  --     Lipid Panel:  Recent Labs  Lab 07/31/21 0152  CHOL 142  TRIG 49  HDL 55  CHOLHDL 2.6  VLDL 10  LDLCALC 77    HgbA1c: No results for input(s): HGBA1C in the last 168 hours. Urine Drug Screen: No results for input(s): LABOPIA, COCAINSCRNUR, LABBENZ, AMPHETMU, THCU, LABBARB in the last 168 hours.  Alcohol Level No results for input(s): ETH in the last 168 hours.  IMAGING past 24 hours No results found.  PHYSICAL EXAM General: Alert, well-developed, well-nourished patient in no acute distress   NEURO:  Mental Status: AA&Ox3  Speech/Language: speech is without dysarthria or aphasia.    Cranial Nerves:  II: PERRL. Visual fields full.  III, IV, VI: EOMI. Eyelids elevate symmetrically.  V: Sensation is intact to light touch and symmetrical to face.  VII: Smile is symmetrical.  VIII: hearing intact to voice. IX, X:  Phonation is normal.  XII: tongue is midline without fasciculations. Motor: 5/5 strength to all muscle groups tested.  Tone: is normal  and bulk is normal Sensation- Intact to light touch bilaterally.   Coordination: FTN intact bilaterally. No drift.  Gait- deferred   ASSESSMENT/PLAN [name] is a 76 y.o. female with history of HTN, HLD and SLE presenting with a transient episode of right sided weakness and facial droop for about 15 minutes.  MRI showed left parietal lobe infarct.  Will plan for TEE to further evaluate for cardioembolic source of stroke and implant loop recorder if TEE is negative.  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 Left M2 stenosis  Code Stroke CT head No acute abnormality. ASPECTS 10.    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  If negative will consider loop recorder Given history of SLE, antiphospholipid antibodies pending. LDL 77 HgbA1c 5.9 VTE prophylaxis - SQH aspirin 81 mg daily and clopidogrel 75 mg daily prior to admission, now  on aspirin 325 mg daily and clopidogrel 75 mg daily for 3 months and then aspirin alone given left M2 stenosis.  Therapy recommendations:  home health PT Disposition:  pending  History of stroke 06/2021 admitted for dizziness and nausea vomiting.  CT no acute abnormality.  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  MRI showed left SCA infarct.  EF 60 to 65%.  LDL 73, A1c 5.9.  Discharged on DAPT and Crestor 20  SLE Continue home Imuran and hydroxychloroquine  Recommend TEE to rule out marantic endocarditis.  Antiphospholipid antibodies pending  Hypertension Home meds:  losartan 100 mg daily,  valsartan 320 mg daily Stable Long-term BP goal normotensive  Hyperlipidemia Home meds:  rosuvastatin 20 mg daily, resumed in hospital LDL 77, goal < 70 Increase rosuvastatin to 40 mg daily Continue statin at discharge  Other Stroke Risk Factors Advanced Age >/= 34  Hx stroke  Other Active Problems RLS  Hospital day # [place] , MSN, AGACNP-BC [facility] See Amion for schedule and pager information 08/01/2021 11:54 AM  ATTENDING NOTE: I reviewed above note and agree with the assessment and plan. Pt was seen and examined.   Daughter at bedside.  Patient reclining in bed, no acute event overnight, neuro stable unchanged.  LE venous Doppler negative for DVT.  Hypercoagulable work-up pending.  Planning for TEE tomorrow and if negative will do loop recorder.  For detailed assessment and plan, please refer to above as I have made changes wherever appropriate.   Kim Hawking, MD PhD Stroke Neurology 08/01/2021 4:55 PM     To contact Stroke Continuity provider, please refer to [email or web address] After hours, contact General Neurology

## 2021-08-02 ENCOUNTER — Encounter (HOSPITAL_COMMUNITY)
Admission: EM | Disposition: A | Discharge status: Home Health | Payer: Self-pay | Source: Home / Self Care | Attending: Internal Medicine

## 2021-08-02 ENCOUNTER — Inpatient Hospital Stay (HOSPITAL_COMMUNITY): Payer: Medicare Other | Admitting: Certified Registered"

## 2021-08-02 ENCOUNTER — Encounter (HOSPITAL_COMMUNITY): Payer: Self-pay | Admitting: Internal Medicine

## 2021-08-02 ENCOUNTER — Other Ambulatory Visit (HOSPITAL_COMMUNITY): Payer: Self-pay

## 2021-08-02 ENCOUNTER — Inpatient Hospital Stay (HOSPITAL_COMMUNITY): Payer: Medicare Other

## 2021-08-02 DIAGNOSIS — I639 Cerebral infarction, unspecified: Secondary | ICD-10-CM

## 2021-08-02 DIAGNOSIS — I351 Nonrheumatic aortic (valve) insufficiency: Secondary | ICD-10-CM

## 2021-08-02 DIAGNOSIS — Q2112 Patent foramen ovale: Secondary | ICD-10-CM

## 2021-08-02 DIAGNOSIS — I340 Nonrheumatic mitral (valve) insufficiency: Secondary | ICD-10-CM

## 2021-08-02 HISTORY — PX: TEE WITHOUT CARDIOVERSION: SHX5443

## 2021-08-02 HISTORY — PX: LOOP RECORDER INSERTION: EP1214

## 2021-08-02 HISTORY — PX: BUBBLE STUDY: SHX6837

## 2021-08-02 LAB — CBC
HCT: 30.4 % — ABNORMAL LOW (ref 36.0–46.0)
Hemoglobin: 10.1 g/dL — ABNORMAL LOW (ref 12.0–15.0)
MCH: 32 pg (ref 26.0–34.0)
MCHC: 33.2 g/dL (ref 30.0–36.0)
MCV: 96.2 fL (ref 80.0–100.0)
Platelets: 285 10*3/uL (ref 150–400)
RBC: 3.16 MIL/uL — ABNORMAL LOW (ref 3.87–5.11)
RDW: 13.2 % (ref 11.5–15.5)
WBC: 3.3 10*3/uL — ABNORMAL LOW (ref 4.0–10.5)
nRBC: 0 % (ref 0.0–0.2)

## 2021-08-02 LAB — BETA-2-GLYCOPROTEIN I ABS, IGG/M/A
Beta-2 Glyco I IgG: <9 GPI IgG units (ref 0–20)
Beta-2-Glycoprotein I IgA: <9 GPI IgA units (ref 0–25)
Beta-2-Glycoprotein I IgM: 13 GPI IgM units (ref 0–32)

## 2021-08-02 LAB — ANTI-SMITH ANTIBODY: ENA SM Ab Ser-aCnc: <0.2 AI (ref 0.0–0.9)

## 2021-08-02 LAB — CARDIOLIPIN ANTIBODIES, IGG, IGM, IGA
Anticardiolipin IgA: <9 U/mL (ref 0–11)
Anticardiolipin IgG: <9 GPL U/mL (ref 0–14)
Anticardiolipin IgM: 16 [MPL'U]/mL — ABNORMAL HIGH (ref 0–12)

## 2021-08-02 LAB — HEMOGLOBIN A1C
Hgb A1c MFr Bld: 5.9 % — ABNORMAL HIGH (ref 4.8–5.6)
Mean Plasma Glucose: 123 mg/dL

## 2021-08-02 LAB — HOMOCYSTEINE: Homocysteine: 11.6 umol/L (ref 0.0–19.2)

## 2021-08-02 LAB — ANTI-DNA ANTIBODY, DOUBLE-STRANDED: ds DNA Ab: 1 IU/mL (ref 0–9)

## 2021-08-02 SURGERY — ECHOCARDIOGRAM, TRANSESOPHAGEAL
Anesthesia: Monitor Anesthesia Care

## 2021-08-02 SURGERY — LOOP RECORDER INSERTION

## 2021-08-02 MED ORDER — ROSUVASTATIN CALCIUM 40 MG PO TABS
40.0000 mg | ORAL_TABLET | Freq: Every day | ORAL | 0 refills | Status: DC
  Filled 2021-08-02: qty 30, 30d supply, fill #0

## 2021-08-02 MED ORDER — LIDOCAINE-EPINEPHRINE 1 %-1:100000 IJ SOLN
INTRAMUSCULAR | Status: DC | PRN
  Administered 2021-08-02: 30 mL

## 2021-08-02 MED ORDER — SODIUM CHLORIDE 0.9 % IV SOLN
INTRAVENOUS | Status: AC | PRN
  Administered 2021-08-02: 500 mL via INTRAVENOUS

## 2021-08-02 MED ORDER — LIDOCAINE-EPINEPHRINE 1 %-1:100000 IJ SOLN
INTRAMUSCULAR | Status: AC
  Filled 2021-08-02: qty 1

## 2021-08-02 MED ORDER — BUTAMBEN-TETRACAINE-BENZOCAINE 2-2-14 % EX AERO
INHALATION_SPRAY | CUTANEOUS | Status: DC | PRN
  Administered 2021-08-02: 1 via TOPICAL

## 2021-08-02 MED ORDER — PROPOFOL 500 MG/50ML IV EMUL
INTRAVENOUS | Status: DC | PRN
  Administered 2021-08-02: 150 ug/kg/min via INTRAVENOUS

## 2021-08-02 MED ORDER — LIDOCAINE HCL (CARDIAC) PF 100 MG/5ML IV SOSY
PREFILLED_SYRINGE | INTRAVENOUS | Status: DC | PRN
  Administered 2021-08-02: 40 mg via INTRAVENOUS

## 2021-08-02 MED ORDER — PROPOFOL 10 MG/ML IV BOLUS
INTRAVENOUS | Status: DC | PRN
Start: 2021-08-02 — End: 2021-08-02
  Administered 2021-08-02 (×2): 10 mg via INTRAVENOUS

## 2021-08-02 MED ORDER — ASPIRIN 325 MG PO TBEC
325.0000 mg | DELAYED_RELEASE_TABLET | Freq: Every day | ORAL | 2 refills | Status: DC
  Filled 2021-08-02: qty 30, 30d supply, fill #0

## 2021-08-02 SURGICAL SUPPLY — 2 items
PACK LOOP INSERTION (CUSTOM PROCEDURE TRAY) ×2 IMPLANT
SYSTEM MONITOR REVEAL LINQ II (Prosthesis & Implant Heart) ×1 IMPLANT

## 2021-08-02 NOTE — Plan of Care (Signed)
°  Problem: Education: Goal: Knowledge of disease or condition will improve Outcome: Progressing Goal: Knowledge of secondary prevention will improve (SELECT ALL) Outcome: Progressing   Problem: Self-Care: Goal: Ability to participate in self-care as condition permits will improve Outcome: Progressing   Problem: Ischemic Stroke/TIA Tissue Perfusion: Goal: Complications of ischemic stroke/TIA will be minimized Outcome: Progressing

## 2021-08-02 NOTE — CV Procedure (Signed)
° °  Transesophageal Echocardiogram  Indications: Stroke  Time out performed  During this procedure the patient was administered propofol under anesthesiology supervision to achieve and maintain moderate sedation.  The patient's heart rate, blood pressure, and oxygen saturation are monitored continuously during the procedure.   Findings:  Left Ventricle: Normal left ventricular ejection fraction 65%  Mitral Valve: Mild MR  Aortic Valve: Mild aortic sclerosis of leaflet tips, mild aortic regurgitation  Tricuspid Valve: Mild TR  Left Atrium: Normal, no left atrial appendage thrombus  Right Atrium: Normal  Intraatrial septum: PFO noted on color Doppler  Bubble Contrast Study: PFO confirmed with bubble study early positive  Impression: Positive PFO.  No masses, no vegetations  Donato Schultz, MD

## 2021-08-02 NOTE — TOC Transition Note (Signed)
Transition of Care [facility]) - CM/SW Discharge Note   Patient Details  Name: Kim [name] MRN: [id number] Date of Birth: [birth year]  Transition of Care [facility]) CM/SW Contact:  Kermit Balo, RN Phone Number: 08/02/2021, 10:40 AM   Clinical Narrative:    Patient is from home with son and daughter in law. Pt was already receiving services at home through [facility]. CM has reached out to [facility] to see what services she was receiving.  Pt has a cane and walker at home.  Pts daughter in law oversees her mediations. She also provides needed transportation.  Pt has supervision most of the day but does have a few hours she is alone.  TOC will update [facility] and have new HH entered for resumption of services.    Final next level of care: Home w Home Health Services Barriers to Discharge: No Barriers Identified   Patient Goals and CMS Choice     Choice offered to / list presented to : Adult Children  Discharge Placement                       Discharge Plan and Services                          HH Arranged: PT, OT HH Agency: [facility] Date HH Agency Contacted: 08/02/21   Representative spoke with at [facility] Agency: Barbara Cower  Social Determinants of Health (SDOH) Interventions     Readmission Risk Interventions No flowsheet data found.

## 2021-08-02 NOTE — Progress Notes (Signed)
PT Cancellation Note  Patient Details Name: Kim [name] MRN: [id number] DOB: [birth year]   Cancelled Treatment:    Reason Eval/Treat Not Completed: Fatigue/lethargy limiting ability to participate; patient fatigued from procedure, but hopeful to go home today.  RN reports throat still numb so can't have PO till better sensation.  Will attempt again if not d/c.    Elray Mcgregor 08/02/2021, 2:19 PM Sheran Lawless, PT Acute Rehabilitation Services Pager:[phone] [phone] 08/02/2021

## 2021-08-02 NOTE — Transfer of Care (Signed)
Immediate Anesthesia Transfer of Care Note  Patient: Kim [name] Care  Procedure(s) Performed: TRANSESOPHAGEAL ECHOCARDIOGRAM (TEE) BUBBLE STUDY  Patient Location: PACU  Anesthesia Type:MAC  Level of Consciousness: drowsy  Airway & Oxygen Therapy: Patient Spontanous Breathing and Patient connected to nasal cannula oxygen  Post-op Assessment: Report given to RN and Post -op Vital signs reviewed and stable  Post vital signs: Reviewed and stable  Last Vitals:  Vitals Value Taken Time  BP 108/68 08/02/21 1244  Temp    Pulse 47 08/02/21 1244  Resp 11 08/02/21 1244  SpO2 99 % 08/02/21 1244  Vitals shown include unvalidated device data.  Last Pain:  Vitals:   08/02/21 1152  TempSrc: Temporal  PainSc: 0-No pain      Patients Stated Pain Goal: 0 (84/72/07 2182)  Complications: No notable events documented.

## 2021-08-02 NOTE — Progress Notes (Signed)
°  Echocardiogram Echocardiogram Transesophageal has been performed.  Leta Jungling M 08/02/2021, 1:03 PM

## 2021-08-02 NOTE — Anesthesia Preprocedure Evaluation (Addendum)
Anesthesia Evaluation  Patient identified by MRN, date of birth, ID band Patient awake    Reviewed: Allergy & Precautions, NPO status , Patient's Chart, lab work & pertinent test results  Airway Mallampati: III  TM Distance: >3 FB Neck ROM: Full    Dental  (+) Edentulous Upper, Edentulous Lower, Dental Advisory Given   Pulmonary neg pulmonary ROS,    Pulmonary exam normal breath sounds clear to auscultation       Cardiovascular hypertension, Pt. on medications Normal cardiovascular exam Rhythm:Regular Rate:Normal  HLD  TTE 2022 1. Left ventricular ejection fraction, by estimation, is 60 to 65%. Left  ventricular ejection fraction by 3D volume is 60 %. The left ventricle has  normal function. The left ventricle has no regional wall motion  abnormalities. There is moderate left  ventricular hypertrophy. Left ventricular diastolic parameters are  consistent with Grade I diastolic dysfunction (impaired relaxation).  2. Right ventricular systolic function is normal. The right ventricular  size is normal. Tricuspid regurgitation signal is inadequate for assessing  PA pressure.  3. The mitral valve is normal in structure. Trivial mitral valve  regurgitation.  4. The aortic valve was not well visualized. Aortic valve regurgitation  is mild. No aortic stenosis is present.  5. Aortic dilatation noted. There is dilatation of the ascending aorta,  measuring 43 mm.  6. The inferior vena cava is normal in size with greater than 50%  respiratory variability, suggesting right atrial pressure of 3 mmHg.    Neuro/Psych PSYCHIATRIC DISORDERS Depression TIACVA, No Residual Symptoms    GI/Hepatic negative GI ROS, Neg liver ROS,   Endo/Other  negative endocrine ROSSLE  Renal/GU negative Renal ROS  negative genitourinary   Musculoskeletal  (+) Arthritis ,   Abdominal   Peds  Hematology negative hematology ROS (+)   Anesthesia  Other Findings   Reproductive/Obstetrics                            Anesthesia Physical Anesthesia Plan  ASA: 3  Anesthesia Plan: MAC   Post-op Pain Management:    Induction: Intravenous  PONV Risk Score and Plan: Propofol infusion and Treatment may vary due to age or medical condition  Airway Management Planned: Natural Airway  Additional Equipment:   Intra-op Plan:   Post-operative Plan:   Informed Consent: I have reviewed the patients History and Physical, chart, labs and discussed the procedure including the risks, benefits and alternatives for the proposed anesthesia with the patient or authorized representative who has indicated his/her understanding and acceptance.     Dental advisory given  Plan Discussed with: CRNA  Anesthesia Plan Comments:         Anesthesia Quick Evaluation

## 2021-08-02 NOTE — Consult Note (Signed)
ELECTROPHYSIOLOGY CONSULT NOTE  Patient ID: Kim [name] MRN: [id number], DOB/AGE: Jun 05, 1946   Admit date: 07/30/2021 Date of Consult: 08/02/2021  Primary Physician: Judd Lien, PA-C Primary Cardiologist: None  Primary Electrophysiologist: New to Dr. Lalla Brothers Reason for Consultation: Cryptogenic stroke; recommendations regarding Implantable Loop Recorder Insurance: Medicare  History of Present Illness EP has been asked to evaluate Kim [name] for placement of an implantable loop recorder to monitor for atrial fibrillation by Dr Roda Shutters.  The patient was admitted on 07/30/2021 with right sided weakness and facial droop  Imaging demonstrated small left MCA infarct.    She has undergone workup for stroke:  Stroke:  small left MCA infarct of parietal lobe likely secondary to cardioembolic vs. Left M2 stenosis  Code Stroke CT head No acute abnormality. ASPECTS 10.    CTA head & neck stable CTA with no LVO, 40% stenosis of proximal right ICA, left M2 stenosis due to calcified plaque, left P2 stenosis MRI  1.5 cm acute ischemic left parietal lobe infarct, remote hemorrhagic left cerebellar infarct 2D Echo EF 60-65%, grade 1 diastolic dysfunction, interatrial septum not well visualized LE venous Doppler no DVT Given history of SLE, will do TEE to rule out marantic endocarditis.  If negative will consider loop recorder Given history of SLE, antiphospholipid antibodies pending. LDL 77 HgbA1c 5.9 VTE prophylaxis - SQH aspirin 81 mg daily and clopidogrel 75 mg daily prior to admission, now on aspirin 325 mg daily and clopidogrel 75 mg daily for 3 months and then aspirin alone given left M2 stenosis.  Therapy recommendations:  home health PT Disposition:  pending   The patient has been monitored on telemetry which has demonstrated sinus rhythm with no arrhythmias.  Inpatient stroke work-up will require a TEE per Neurology.   Echocardiogram as above. Lab work is reviewed.  Prior to  admission, the patient denies chest pain, shortness of breath, dizziness, palpitations, or syncope.  She is recovering from her stroke with plans to return home  at discharge.  Past Medical History:  Diagnosis Date   Arthritis    Depression    Essential hypertension 06/28/2021   Mixed hyperlipidemia 06/28/2021     Surgical History: No past surgical history on file.   Medications Prior to Admission  Medication Sig Dispense Refill Last Dose   acetaminophen (TYLENOL) 325 MG tablet Take 2 tablets (650 mg total) by mouth every 6 (six) hours as needed for mild pain (or Fever >/= 101).   unk   amLODipine (NORVASC) 5 MG tablet Take 5 mg by mouth daily.   07/30/2021   aspirin EC 81 MG tablet Take 1 tablet (81 mg total) by mouth daily. Swallow whole.  2 07/30/2021   azaTHIOprine (IMURAN) 50 MG tablet Take 50 mg by mouth daily.   07/30/2021   Capsaicin 0.1 % CREA Apply 1 application topically 2 (two) times daily as needed (tingling  and numbness to plantar foot).   Past Month   clopidogrel (PLAVIX) 75 MG tablet Take 75 mg by mouth daily.   07/30/2021   hydrALAZINE (APRESOLINE) 50 MG tablet Take 50 mg by mouth in the morning and at bedtime.   07/30/2021   hydroxychloroquine (PLAQUENIL) 200 MG tablet Take 200 mg by mouth daily.   07/30/2021   losartan (COZAAR) 100 MG tablet Take 100 mg by mouth daily.   07/30/2021   magnesium oxide (MAG-OX) 400 MG tablet Take 400 mg by mouth in the morning and at bedtime.   07/30/2021   polyethylene  glycol (MIRALAX / GLYCOLAX) 17 g packet Take 17 g by mouth daily as needed for mild constipation. 14 each 0 Past Month   rOPINIRole (REQUIP) 1 MG tablet Take 1 mg by mouth at bedtime.   07/29/2021   rosuvastatin (CRESTOR) 20 MG tablet Take 20 mg by mouth at bedtime.   07/29/2021   senna-docusate (SENOKOT-S) 8.6-50 MG tablet Take 1 tablet by mouth 2 (two) times daily.   07/30/2021   sodium chloride 1 g tablet Take 1 g by mouth 3 (three) times daily.   07/30/2021   traMADol (ULTRAM) 50  MG tablet Take 50 mg by mouth at bedtime.   07/29/2021   Vitamin D, Ergocalciferol, (DRISDOL) 1.25 MG (50000 UNIT) CAPS capsule Take 50,000 Units by mouth every Thursday.   07/29/2021   DULoxetine (CYMBALTA) 20 MG capsule Take 20 mg by mouth daily. (Patient not taking: Reported on 07/30/2021)   Not Taking   valsartan (DIOVAN) 320 MG tablet Take 320 mg by mouth daily. (Patient not taking: Reported on 07/30/2021)   Not Taking    Inpatient Medications:    stroke: mapping our early stages of recovery book   Does not apply Once   aspirin EC  325 mg Oral Daily   azaTHIOprine  50 mg Oral Daily   clopidogrel  75 mg Oral Daily   DULoxetine  20 mg Oral Daily   heparin  5,000 Units Subcutaneous Q8H   hydroxychloroquine  200 mg Oral Daily   magnesium oxide  400 mg Oral BID   rOPINIRole  1 mg Oral QHS   rosuvastatin  40 mg Oral Daily   senna-docusate  1 tablet Oral BID   sodium chloride  1 g Oral TID   traMADol  50 mg Oral BID    Allergies:  Allergies  Allergen Reactions   Beef-Derived Products     vegetarian   Chicken Protein     vegetarian   Fish-Derived Products     vegetarian   Gabapentin Other (See Comments)    unknown unknown    Labetalol Other (See Comments)    Bradycardia   Lisinopril Other (See Comments)   Nebivolol Other (See Comments)    Bradycardia,   Pork-Derived Products     Vegetarian (pt accepts heparin products 07/31/21)    Social History   Socioeconomic History   Marital status: Married    Spouse name: Not on file   Number of children: Not on file   Years of education: Not on file   Highest education level: Not on file  Occupational History   Not on file  Tobacco Use   Smoking status: Never   Smokeless tobacco: Never  Vaping Use   Vaping Use: Never used  Substance and Sexual Activity   Alcohol use: No   Drug use: No   Sexual activity: Not on file  Other Topics Concern   Not on file  Social History Narrative   Not on file   Social Determinants of  Health   Financial Resource Strain: Not on file  Food Insecurity: Not on file  Transportation Needs: Not on file  Physical Activity: Not on file  Stress: Not on file  Social Connections: Not on file  Intimate Partner Violence: Not on file     Family History  Problem Relation Age of Onset   Heart disease Neg Hx       Review of Systems: All other systems reviewed and are otherwise negative except as noted above.  Physical Exam: Vitals:  08/01/21 2113 08/01/21 2345 08/02/21 0318 08/02/21 0743  BP: (!) 148/76 (!) 163/86 (!) 146/87 (!) 160/75  Pulse: 63 (!) 53 (!) 54 (!) 49  Resp: 18 14 14 20   Temp: (!) 97.4 F (36.3 C) 98.1 F (36.7 C) 98.1 F (36.7 C) (!) 97.5 F (36.4 C)  TempSrc: Oral Oral Oral Oral  SpO2: 98% 96% 96% 99%    GEN- The patient is well appearing, alert and oriented x 3 today.   Head- normocephalic, atraumatic Eyes-  Sclera clear, conjunctiva pink Ears- hearing intact Oropharynx- clear Neck- supple Lungs- Clear to ausculation bilaterally, normal work of breathing Heart- Regular rate and rhythm, no murmurs, rubs or gallops  GI- soft, NT, ND, + BS Extremities- no clubbing, cyanosis, or edema MS- no significant deformity or atrophy Skin- no rash or lesion Psych- euthymic mood, full affect   Labs:   Lab Results  Component Value Date   WBC 3.3 (L) 08/02/2021   HGB 10.1 (L) 08/02/2021   HCT 30.4 (L) 08/02/2021   MCV 96.2 08/02/2021   PLT 285 08/02/2021    Recent Labs  Lab 07/31/21 0152 08/01/21 0227  NA 134* 135  K 3.4* 4.1  CL 103 104  CO2 24 23  BUN 9 16  CREATININE 0.64 0.62  CALCIUM 9.3 8.9  PROT 7.2  --   BILITOT 0.4  --   ALKPHOS 65  --   ALT 14  --   AST 19  --   GLUCOSE 108* 95     Radiology/Studies: CT ANGIO HEAD NECK W WO CM  Result Date: 07/30/2021 CLINICAL DATA:  Initial evaluation for acute TIA. EXAM: CT ANGIOGRAPHY HEAD AND NECK TECHNIQUE: Multidetector CT imaging of the head and neck was performed using the  standard protocol during bolus administration of intravenous contrast. Multiplanar CT image reconstructions and MIPs were obtained to evaluate the vascular anatomy. Carotid stenosis measurements (when applicable) are obtained utilizing NASCET criteria, using the distal internal carotid diameter as the denominator. RADIATION DOSE REDUCTION: This exam was performed according to the departmental dose-optimization program which includes automated exposure control, adjustment of the mA and/or kV according to patient size and/or use of iterative reconstruction technique. CONTRAST:  83mL OMNIPAQUE IOHEXOL 350 MG/ML SOLN COMPARISON:  CT from earlier the same day as well as prior CTA from 06/26/2021. FINDINGS: CTA NECK FINDINGS Aortic arch: Visualized aortic arch normal in caliber with normal branch pattern. Mild atheromatous change about the arch and origin the great vessels without significant stenosis. Right carotid system: Right common and internal carotid arteries tortuous. Atheromatous change about the right carotid bulb/proximal right ICA with associated stenosis of up to 40% by NASCET criteria. Right ICA patent distally without stenosis or dissection. Left carotid system: Left common and internal carotid arteries are tortuous. Calcified plaque about the left carotid bulb/proximal left ICA without significant stenosis. Left ICA patent distally without stenosis or dissection. Vertebral arteries: Both vertebral arteries arise from subclavian arteries. Right V1 segment duplicated proximally. No significant proximal subclavian artery stenosis. Vertebral arteries tortuous but patent without stenosis or dissection. Skeleton: No worrisome osseous lesions. Mild-to-moderate spondylosis at C4-5 through C6-7. Patient is edentulous. Other neck: No other acute soft tissue abnormality within the neck. Upper chest: Visualized upper chest demonstrates no acute finding. Review of the MIP images confirms the above findings CTA HEAD  FINDINGS Anterior circulation: Petrous segments patent bilaterally. Atheromatous change within the carotid siphons with associated mild multifocal narrowing. 2 mm funnel shaped outpouching arising from the supraclinoid right  ICA demonstrates a small vessel emanating from its apex on today's exam, consistent with a small vascular infundibulum (series 6, image 210). A1 segments patent bilaterally. Normal anterior communicating artery complex. Anterior cerebral arteries patent to their distal aspects of stenosis. Normal in stenosis or occlusion. Normal MCA bifurcations. Moderate calcified stenosis involving a proximal left M2 branch again noted, stable. MCA branches well perfused distally without new or progressive finding. Posterior circulation: Focal moderate proximal right V4 stenosis noted (series 6, image 179). Vertebral arteries otherwise patent to the vertebrobasilar junction without stenosis. Both PICA origins patent and normal. Basilar patent to its distal aspect without stenosis. Superior cerebellar arteries patent bilaterally. Mild narrowing at the right P1 P2 junction again noted. Short-segment moderate left P2 stenosis noted, also stable. PCAs remain patent to their distal aspects bilaterally. Venous sinuses: Patent allowing for timing the contrast bolus. Anatomic variants: None significant.  No aneurysm. Review of the MIP images confirms the above findings IMPRESSION: 1. Stable CTA of the head and neck. No large vessel occlusion. 2. 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 3. Moderate calcified stenosis involving a proximal left M2 branch, with short-segment moderate left P2 stenoses, stable. 4. Diffuse tortuosity of the major arterial vasculature of the neck, suggesting chronic underlying hypertension. 5. 2 mm outpouching arising from the supraclinoid right ICA has an appearance most consistent with a vascular infundibulum on today's exam. No intracranial aneurysm. Electronically Signed   By:  Jeannine Boga M.D.   On: 07/30/2021 22:14   CT Head Wo Contrast  Result Date: 07/30/2021 CLINICAL DATA:  TIA.  Facial droop. EXAM: CT HEAD WITHOUT CONTRAST TECHNIQUE: Contiguous axial images were obtained from the base of the skull through the vertex without intravenous contrast. RADIATION DOSE REDUCTION: This exam was performed according to the departmental dose-optimization program which includes automated exposure control, adjustment of the mA and/or kV according to patient size and/or use of iterative reconstruction technique. COMPARISON:  06/26/2021 FINDINGS: Brain: No acute intracranial abnormality. Specifically, no hemorrhage, hydrocephalus, mass lesion, acute infarction, or significant intracranial injury. Vascular: No hyperdense vessel or unexpected calcification. Skull: No acute calvarial abnormality. Sinuses/Orbits: No acute findings Other: None IMPRESSION: No acute intracranial abnormality. Electronically Signed   By: Rolm Baptise M.D.   On: 07/30/2021 18:30   MR BRAIN WO CONTRAST  Result Date: 07/31/2021 CLINICAL DATA:  Initial evaluation for acute TIA, right-sided weakness. EXAM: MRI HEAD WITHOUT CONTRAST TECHNIQUE: Multiplanar, multiecho pulse sequences of the brain and surrounding structures were obtained without intravenous contrast. COMPARISON:  Prior CTs from 07/30/2021. FINDINGS: Brain: Cerebral volume within normal limits. Patchy T2/FLAIR hyperintensity involving the periventricular deep white matter both cerebral hemispheres most consistent with chronic vessel ischemic disease, mild for age. Few scattered superimposed remote lacunar infarcts present about the hemispheric cerebral white matter. Remote hemorrhagic left cerebellar infarct noted. Mild diffusion signal abnormality at this location felt to be consistent with artifact. 1.5 cm acute ischemic infarct seen involving the posterior left parietal lobe, postcentral gyrus (series 5, image 84). No associated hemorrhage or mass  effect. No other evidence for acute or subacute ischemia. Gray-white matter differentiation otherwise maintained. No acute intracranial hemorrhage. No mass lesion or midline shift. No hydrocephalus or extra-axial fluid collection. Pituitary gland suprasellar region normal. Midline structures intact. Vascular: Major intracranial vascular flow voids are maintained. Skull and upper cervical spine: Craniocervical junction within normal limits. Bone marrow signal intensity normal. No scalp soft tissue abnormality. Sinuses/Orbits: Globes and orbital soft tissues within normal limits. Mild scattered mucosal  thickening noted within the ethmoidal air cells. No mastoid effusion. Other: None. IMPRESSION: 1. 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 2. Remote hemorrhagic left cerebellar infarct. 3. Underlying mild chronic microvascular ischemic disease for age. Electronically Signed   By: Jeannine Boga M.D.   On: 07/31/2021 01:31   DG Chest Portable 1 View  Result Date: 07/30/2021 CLINICAL DATA:  Concern for pneumonia EXAM: PORTABLE CHEST 1 VIEW COMPARISON:  Chest x-ray dated June 26, 2021 FINDINGS: Cardiac and mediastinal contours are unchanged. Small hiatal hernia. 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 Visualized lungs are clear, left costophrenic angle excluded from the field of view. No large pleural effusion or pneumothorax. IMPRESSION: No active disease. Electronically Signed   By: Yetta Glassman M.D.   On: 07/30/2021 16:39   VAS [place] LOWER EXTREMITY VENOUS (DVT)  Result Date: 08/01/2021  Lower Venous DVT Study Patient Name:  SHAMEL LAUWERS  Date of Exam:   08/01/2021 Medical Rec #: [id number]      Accession #:    [id number] Date of Birth: 04-18-1946      Patient Gender: F Patient Age:   52 years Exam Location:  [facility] Procedure:      VAS [place] LOWER EXTREMITY  VENOUS (DVT) Referring Phys: Cornelius Moras XU --------------------------------------------------------------------------------  Indications: Stroke.  Comparison Study: No prior study Performing Technologist: Maudry Mayhew MHA, RDMS, RVT, RDCS  Examination Guidelines: A complete evaluation includes B-mode imaging, spectral Doppler, color Doppler, and power Doppler as needed of all accessible portions of each vessel. Bilateral testing is considered an integral part of a complete examination. Limited examinations for reoccurring indications may be performed as noted. The reflux portion of the exam is performed with the patient in reverse Trendelenburg.  +---------+---------------+---------+-----------+----------+--------------+  RIGHT     Compressibility Phasicity Spontaneity Properties Thrombus Aging  +---------+---------------+---------+-----------+----------+--------------+  CFV       Full            Yes       Yes                                    +---------+---------------+---------+-----------+----------+--------------+  SFJ       Full                                                             +---------+---------------+---------+-----------+----------+--------------+  FV Prox   Full                                                             +---------+---------------+---------+-----------+----------+--------------+  FV Mid    Full                                                             +---------+---------------+---------+-----------+----------+--------------+  FV Distal Full                                                             +---------+---------------+---------+-----------+----------+--------------+  PFV       Full                                                             +---------+---------------+---------+-----------+----------+--------------+  POP       Full            Yes       Yes                                    +---------+---------------+---------+-----------+----------+--------------+   PTV       Full                                                             +---------+---------------+---------+-----------+----------+--------------+  PERO      Full                                                             +---------+---------------+---------+-----------+----------+--------------+   +---------+---------------+---------+-----------+----------+--------------+  LEFT      Compressibility Phasicity Spontaneity Properties Thrombus Aging  +---------+---------------+---------+-----------+----------+--------------+  CFV       Full            Yes       Yes                                    +---------+---------------+---------+-----------+----------+--------------+  SFJ       Full                                                             +---------+---------------+---------+-----------+----------+--------------+  FV Prox   Full                                                             +---------+---------------+---------+-----------+----------+--------------+  FV Mid    Full                                                             +---------+---------------+---------+-----------+----------+--------------+  FV Distal Full                                                             +---------+---------------+---------+-----------+----------+--------------+  PFV       Full                                                             +---------+---------------+---------+-----------+----------+--------------+  POP       Full            Yes       Yes                                    +---------+---------------+---------+-----------+----------+--------------+  PTV       Full                                                             +---------+---------------+---------+-----------+----------+--------------+  PERO      Full                                                             +---------+---------------+---------+-----------+----------+--------------+     Summary: RIGHT: - There is no evidence of  deep vein thrombosis in the lower extremity.  - No cystic structure found in the popliteal fossa.  LEFT: - There is no evidence of deep vein thrombosis in the lower extremity.  - No cystic structure found in the popliteal fossa.  *See table(s) above for measurements and observations. Electronically signed by Deitra Mayo MD on 08/01/2021 at 3:21:02 PM.    Final     12-lead ECG on arrival showed NSR at 79 bpm (personally reviewed) All prior EKG's in EPIC reviewed with no documented atrial fibrillation  Telemetry NSR and bradycardia overnight into the 40s. (personally reviewed)  Assessment and Plan:  1. Cryptogenic stroke The patient presents with cryptogenic stroke.  The patient does have a TEE planned for this AM.  I spoke at length with the patient about monitoring for afib with an implantable loop recorder.  Risks, benefits, and alteratives to implantable loop recorder were discussed with the patient today.   At this time, the patient is very clear in their decision to proceed with implantable loop recorder.   Wound care was reviewed with the patient (keep incision clean and dry for 3 days).  Wound check scheduled and entered in AVS. Please call with questions.    Shirley Friar, PA-C 08/02/2021 9:36 AM

## 2021-08-02 NOTE — Discharge Summary (Addendum)
Physician Discharge Summary  [name]:[id number] DOB: 08-Jun-1946 DOA: 07/30/2021  PCP: Judd Lien, PA-C  Admit date: 07/30/2021 Discharge date: 08/02/2021  Admitted From: Home Disposition: Home   Recommendations for Outpatient Follow-up:  Follow up with PCP in 1-2 weeks Follow up with EP for wound recheck s/p loop recorder implantation 08/02/2021. Follow up with neurology in 6-8 weeks post-stroke.  Follow up antiphospholipid antibody panel pending at discharge. Recommend surveillance of ascending aorta dilatation (43mm).   Home Health: PT, OT Equipment/Devices: None new Discharge Condition: Stable CODE STATUS: Full Diet recommendation: Heart healthy  Brief/Interim Summary: Kim [name] is a 76 y.o. 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  MRI showed an acute left parietal lobe infarct. No atrial fibrillation was detected on cardiac monitoring and echocardiogram revealed no interatrial shunt or cardioembolic source. TEE and loop recorder implantation was pursued prior to discharge. Neurology has recommended increasing aspirin from  to  and continuing plavix and statin in hopes of secondary stroke risk reduction.   Discharge Diagnoses:  Principal Problem:   TIA (transient ischemic attack) Active Problems:   Restless leg syndrome   Essential hypertension   Mixed hyperlipidemia   Systemic lupus erythematosus (HCC)  Acute ischemic CVA: CT head negative.  MRI brain shows 1.5 cm acute ischemic nonhemorrhagic left parietal lobe infarct. CTA head & neck stable CTA with no LVO, 40% stenosis of proximal right ICA, left M2 stenosis due to calcified plaque, left P2 stenosis.  - TEE performed 1/23 showing no evidence of marantic endocarditis. There was PFO confirmed with bubble study. No DVT on venous U/S LE's. - Loop recorder  implanted 08/02/2021. Wound care instructions reviewed with patient, follow up arranged with EP scheduled prior to discharge - Continue aspirin, increase  > , continue plavix. This will be continued for 3 months, then transition to aspirin alone given the left M2 stenosis on CTA. - Continue high-intensity statin. LDL 77 - Home health PT and OT arranged.   Hypertension: Stable - Restart home losartan, amlodipine and hydralazine at discharge after period for permissive HTN has ended.   Hyperlipidemia:  - Continue high dose of statin   Restless leg syndrome:  - Continue Requip and Cymbalta   History of lupus: CRP 0.5. - Continue hydroxychloroquine and azathioprine   Anemia of chronic disease: Likely secondary to lupus. H&H is stable.    Hyponatremia: Chronic, now wnl on home salt tabs.   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  - Monitor after discharge.   Discharge Instructions Discharge Instructions     Diet - low sodium heart healthy   Complete by: As directed    Discharge instructions   Complete by: As directed    You were admitted for another stroke. A loop recorder was placed prior to discharge to detect an abnormal heart rhythm that may lead to stroke. Please follow up with the cardiology/EP clinic as directed after discharge for wound recheck. You will also follow up with Dr. Pearlean Brownie, neurology, as scheduled on 2/22. You will also need to continue following up with your PCP or seek medical attention right away if your symptoms return.  - Increase aspirin from  to . You should take this aspirin with plavix  daily for the next 3 months. Then continue on aspirin alone (stopping plavix at that time).  - Increase  crestor from  to  - You will have home health physical and occupational therapy arranged at discharge.   Increase activity slowly   Complete by: As directed       Allergies as of 08/02/2021       Reactions    Beef-derived Products    vegetarian   Chicken Protein    vegetarian   Fish-derived Products    vegetarian   Gabapentin Other (See Comments)   unknown unknown   Labetalol Other (See Comments)   Bradycardia   Lisinopril Other (See Comments)   Nebivolol Other (See Comments)   Bradycardia,   Pork-derived Products    Vegetarian (pt accepts heparin products 07/31/21)        Medication List     STOP taking these medications    DULoxetine 20 MG capsule Commonly known as: CYMBALTA   valsartan 320 MG tablet Commonly known as: DIOVAN       TAKE these medications    acetaminophen 325 MG tablet Commonly known as: TYLENOL Take 2 tablets (650 mg total) by mouth every 6 (six) hours as needed for mild pain (or Fever >/= 101).   amLODipine 5 MG tablet Commonly known as: NORVASC Take 5 mg by mouth daily.   aspirin EC 325 MG tablet Take 1 tablet (325 mg total) by mouth daily. What changed:  medication strength how much to take additional instructions   azaTHIOprine 50 MG tablet Commonly known as: IMURAN Take 50 mg by mouth daily.   Capsaicin 0.1 % Crea Apply 1 application topically 2 (two) times daily as needed (tingling  and numbness to plantar foot).   clopidogrel 75 MG tablet Commonly known as: PLAVIX Take 75 mg by mouth daily.   hydrALAZINE 50 MG tablet Commonly known as: APRESOLINE Take 50 mg by mouth in the morning and at bedtime.   hydroxychloroquine 200 MG tablet Commonly known as: PLAQUENIL Take 200 mg by mouth daily.   losartan 100 MG tablet Commonly known as: COZAAR Take 100 mg by mouth daily.   magnesium oxide 400 MG tablet Commonly known as: MAG-OX Take 400 mg by mouth in the morning and at bedtime.   polyethylene glycol 17 g packet Commonly known as: MIRALAX / GLYCOLAX Take 17 g by mouth daily as needed for mild constipation.   rOPINIRole 1 MG tablet Commonly known as: REQUIP Take 1 mg by mouth at bedtime.   rosuvastatin 40 MG  tablet Commonly known as: CRESTOR Take 1 tablet (40 mg total) by mouth daily. What changed:  medication strength how much to take when to take this   senna-docusate 8.6-50 MG tablet Commonly known as: Senokot-S Take 1 tablet by mouth 2 (two) times daily.   sodium chloride 1 g tablet Take 1 g by mouth 3 (three) times daily.   traMADol 50 MG tablet Commonly known as: ULTRAM Take 50 mg by mouth at bedtime.   Vitamin D (Ergocalciferol) 1.25 MG (50000 UNIT) Caps capsule Commonly known as: DRISDOL Take 50,000 Units by mouth every Thursday.        Follow-up Information     Micki Riley, MD. Go on 09/01/2021.   Specialties: Neurology, Radiology Why: 11:00am for stroke follow up Contact information: [address] [place] [address] [phone]         Irving Copas H, PA-C Follow up.   Specialty: Physician Assistant Contact information: [phone] [place] [address] [phone]  Allergies  Allergen Reactions   Beef-Derived Products     vegetarian   Chicken Protein     vegetarian   Fish-Derived Products     vegetarian   Gabapentin Other (See Comments)    unknown unknown    Labetalol Other (See Comments)    Bradycardia   Lisinopril Other (See Comments)   Nebivolol Other (See Comments)    Bradycardia,   Pork-Derived Products     Vegetarian (pt accepts heparin products 07/31/21)    Consultations: Neurology EP  Procedures/Studies: CT ANGIO HEAD NECK W WO CM  Result Date: 07/30/2021 CLINICAL DATA:  Initial evaluation for acute TIA. EXAM: CT ANGIOGRAPHY HEAD AND NECK TECHNIQUE: Multidetector CT imaging of the head and neck was performed using the standard protocol during bolus administration of intravenous contrast. Multiplanar CT image reconstructions and MIPs were obtained to evaluate the vascular anatomy. Carotid stenosis measurements (when applicable) are obtained utilizing NASCET criteria,  using the distal internal carotid diameter as the denominator. RADIATION DOSE REDUCTION: This exam was performed according to the departmental dose-optimization program which includes automated exposure control, adjustment of the mA and/or kV according to patient size and/or use of iterative reconstruction technique. CONTRAST:  75mL OMNIPAQUE IOHEXOL 350 MG/ML SOLN COMPARISON:  CT from earlier the same day as well as prior CTA from 06/26/2021. FINDINGS: CTA NECK FINDINGS Aortic arch: Visualized aortic arch normal in caliber with normal branch pattern. Mild atheromatous change about the arch and origin the great vessels without significant stenosis. Right carotid system: Right common and internal carotid arteries tortuous. Atheromatous change about the right carotid bulb/proximal right ICA with associated stenosis of up to 40% by NASCET criteria. Right ICA patent distally without stenosis or dissection. Left carotid system: Left common and internal carotid arteries are tortuous. Calcified plaque about the left carotid bulb/proximal left ICA without significant stenosis. Left ICA patent distally without stenosis or dissection. Vertebral arteries: Both vertebral arteries arise from subclavian arteries. Right V1 segment duplicated proximally. No significant proximal subclavian artery stenosis. Vertebral arteries tortuous but patent without stenosis or dissection. Skeleton: No worrisome osseous lesions. Mild-to-moderate spondylosis at C4-5 through C6-7. Patient is edentulous. Other neck: No other acute soft tissue abnormality within the neck. Upper chest: Visualized upper chest demonstrates no acute finding. Review of the MIP images confirms the above findings CTA HEAD FINDINGS Anterior circulation: Petrous segments patent bilaterally. Atheromatous change within the carotid siphons with associated mild multifocal narrowing. 2 mm funnel shaped outpouching arising from the supraclinoid right ICA demonstrates a small vessel  emanating from its apex on today's exam, consistent with a small vascular infundibulum (series 6, image 210). A1 segments patent bilaterally. Normal anterior communicating artery complex. Anterior cerebral arteries patent to their distal aspects of stenosis. Normal in stenosis or occlusion. Normal MCA bifurcations. Moderate calcified stenosis involving a proximal left M2 branch again noted, stable. MCA branches well perfused distally without new or progressive finding. Posterior circulation: Focal moderate proximal right V4 stenosis noted (series 6, image 179). Vertebral arteries otherwise patent to the vertebrobasilar junction without stenosis. Both PICA origins patent and normal. Basilar patent to its distal aspect without stenosis. Superior cerebellar arteries patent bilaterally. Mild narrowing at the right P1 P2 junction again noted. Short-segment moderate left P2 stenosis noted, also stable. PCAs remain patent to their distal aspects bilaterally. Venous sinuses: Patent allowing for timing the contrast bolus. Anatomic variants: None significant.  No aneurysm. Review of the MIP images confirms the above findings IMPRESSION: 1. Stable CTA of the head and  neck. No large vessel occlusion. 2. 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 3. Moderate calcified stenosis involving a proximal left M2 branch, with short-segment moderate left P2 stenoses, stable. 4. Diffuse tortuosity of the major arterial vasculature of the neck, suggesting chronic underlying hypertension. 5. 2 mm outpouching arising from the supraclinoid right ICA has an appearance most consistent with a vascular infundibulum on today's exam. No intracranial aneurysm. Electronically Signed   By: Rise Mu M.D.   On: 07/30/2021 22:14   CT Head Wo Contrast  Result Date: 07/30/2021 CLINICAL DATA:  TIA.  Facial droop. EXAM: CT HEAD WITHOUT CONTRAST TECHNIQUE: Contiguous axial images were obtained from the base of the skull through the  vertex without intravenous contrast. RADIATION DOSE REDUCTION: This exam was performed according to the departmental dose-optimization program which includes automated exposure control, adjustment of the mA and/or kV according to patient size and/or use of iterative reconstruction technique. COMPARISON:  06/26/2021 FINDINGS: Brain: No acute intracranial abnormality. Specifically, no hemorrhage, hydrocephalus, mass lesion, acute infarction, or significant intracranial injury. Vascular: No hyperdense vessel or unexpected calcification. Skull: No acute calvarial abnormality. Sinuses/Orbits: No acute findings Other: None IMPRESSION: No acute intracranial abnormality. Electronically Signed   By: Charlett Nose M.D.   On: 07/30/2021 18:30   MR BRAIN WO CONTRAST  Result Date: 07/31/2021 CLINICAL DATA:  Initial evaluation for acute TIA, right-sided weakness. EXAM: MRI HEAD WITHOUT CONTRAST TECHNIQUE: Multiplanar, multiecho pulse sequences of the brain and surrounding structures were obtained without intravenous contrast. COMPARISON:  Prior CTs from 07/30/2021. FINDINGS: Brain: Cerebral volume within normal limits. Patchy T2/FLAIR hyperintensity involving the periventricular deep white matter both cerebral hemispheres most consistent with chronic vessel ischemic disease, mild for age. Few scattered superimposed remote lacunar infarcts present about the hemispheric cerebral white matter. Remote hemorrhagic left cerebellar infarct noted. Mild diffusion signal abnormality at this location felt to be consistent with artifact. 1.5 cm acute ischemic infarct seen involving the posterior left parietal lobe, postcentral gyrus (series 5, image 84). No associated hemorrhage or mass effect. No other evidence for acute or subacute ischemia. Gray-white matter differentiation otherwise maintained. No acute intracranial hemorrhage. No mass lesion or midline shift. No hydrocephalus or extra-axial fluid collection. Pituitary gland  suprasellar region normal. Midline structures intact. Vascular: Major intracranial vascular flow voids are maintained. Skull and upper cervical spine: Craniocervical junction within normal limits. Bone marrow signal intensity normal. No scalp soft tissue abnormality. Sinuses/Orbits: Globes and orbital soft tissues within normal limits. Mild scattered mucosal thickening noted within the ethmoidal air cells. No mastoid effusion. Other: None. IMPRESSION: 1. 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 2. Remote hemorrhagic left cerebellar infarct. 3. Underlying mild chronic microvascular ischemic disease for age. Electronically Signed   By: Rise Mu M.D.   On: 07/31/2021 01:31   DG Chest Portable 1 View  Result Date: 07/30/2021 CLINICAL DATA:  Concern for pneumonia EXAM: PORTABLE CHEST 1 VIEW COMPARISON:  Chest x-ray dated June 26, 2021 FINDINGS: Cardiac and mediastinal contours are unchanged. Small hiatal hernia. 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 Visualized lungs are clear, left costophrenic angle excluded from the field of view. No large pleural effusion or pneumothorax. IMPRESSION: No active disease. Electronically Signed   By: Allegra Lai M.D.   On: 07/30/2021 16:39   VAS [place] LOWER EXTREMITY VENOUS (DVT)  Result Date: 08/01/2021  Lower Venous DVT Study Patient Name:  DAVINITY  Evelene Croon  Date of Exam:   08/01/2021 Medical Rec #: [id number]      Accession #:    [id number] Date of Birth: 08-Sep-1945      Patient Gender: F Patient Age:   4 years Exam Location:  [facility] Procedure:      VAS [place] LOWER EXTREMITY VENOUS (DVT) Referring Phys: Scheryl Marten XU --------------------------------------------------------------------------------  Indications: Stroke.  Comparison Study: No prior study Performing Technologist: Gertie Fey MHA, RDMS, RVT, RDCS   Examination Guidelines: A complete evaluation includes B-mode imaging, spectral Doppler, color Doppler, and power Doppler as needed of all accessible portions of each vessel. Bilateral testing is considered an integral part of a complete examination. Limited examinations for reoccurring indications may be performed as noted. The reflux portion of the exam is performed with the patient in reverse Trendelenburg.  +---------+---------------+---------+-----------+----------+--------------+  RIGHT     Compressibility Phasicity Spontaneity Properties Thrombus Aging  +---------+---------------+---------+-----------+----------+--------------+  CFV       Full            Yes       Yes                                    +---------+---------------+---------+-----------+----------+--------------+  SFJ       Full                                                             +---------+---------------+---------+-----------+----------+--------------+  FV Prox   Full                                                             +---------+---------------+---------+-----------+----------+--------------+  FV Mid    Full                                                             +---------+---------------+---------+-----------+----------+--------------+  FV Distal Full                                                             +---------+---------------+---------+-----------+----------+--------------+  PFV       Full                                                             +---------+---------------+---------+-----------+----------+--------------+  POP       Full            Yes       Yes                                    +---------+---------------+---------+-----------+----------+--------------+  PTV       Full                                                             +---------+---------------+---------+-----------+----------+--------------+  PERO      Full                                                              +---------+---------------+---------+-----------+----------+--------------+   +---------+---------------+---------+-----------+----------+--------------+  LEFT      Compressibility Phasicity Spontaneity Properties Thrombus Aging  +---------+---------------+---------+-----------+----------+--------------+  CFV       Full            Yes       Yes                                    +---------+---------------+---------+-----------+----------+--------------+  SFJ       Full                                                             +---------+---------------+---------+-----------+----------+--------------+  FV Prox   Full                                                             +---------+---------------+---------+-----------+----------+--------------+  FV Mid    Full                                                             +---------+---------------+---------+-----------+----------+--------------+  FV Distal Full                                                             +---------+---------------+---------+-----------+----------+--------------+  PFV       Full                                                             +---------+---------------+---------+-----------+----------+--------------+  POP       Full            Yes       Yes                                    +---------+---------------+---------+-----------+----------+--------------+  PTV       Full                                                             +---------+---------------+---------+-----------+----------+--------------+  PERO      Full                                                             +---------+---------------+---------+-----------+----------+--------------+     Summary: RIGHT: - There is no evidence of deep vein thrombosis in the lower extremity.  - No cystic structure found in the popliteal fossa.  LEFT: - There is no evidence of deep vein thrombosis in the lower extremity.  - No cystic structure found in the popliteal fossa.   *See table(s) above for measurements and observations. Electronically signed by Waverly Ferrari MD on 08/01/2021 at 3:21:02 PM.    Final       Subjective: No further weakness, numbness or speech difficulties. DIL at the bedside assists with translation per patient wishes. She has no complaints and would like to go home.  Discharge Exam: Vitals:   08/02/21 0318 08/02/21 0743  BP: (!) 146/87 (!) 160/75  Pulse: (!) 54 (!) 49  Resp: 14 20  Temp: 98.1 F (36.7 C) (!) 97.5 F (36.4 C)  SpO2: 96% 99%   General: Pt is alert, awake, not in acute distress Cardiovascular: Regular bradycardia, S1/S2 +, no rubs, no gallops Respiratory: CTA bilaterally, no wheezing, no rhonchi Abdominal: Soft, NT, ND, bowel sounds + Extremities: No edema, no cyanosis  Labs: BNP (last 3 results) No results for input(s): BNP in the last 8760 hours. Basic Metabolic Panel: Recent Labs  Lab 07/30/21 1625 07/31/21 0152 08/01/21 0227  NA 134* 134* 135  K 3.7 3.4* 4.1  CL 103 103 104  CO2 GLUCOSE 120* 108* 95  BUN CREATININE 0.65 0.64 0.62  CALCIUM 9.0 9.3 8.9  MG  --  2.1  --    Liver Function Tests: Recent Labs  Lab 07/30/21 1625 07/31/21 0152  AST 21 19  ALT 14 14  ALKPHOS 65 65  BILITOT 0.4 0.4  PROT 7.4 7.2  ALBUMIN 3.7 3.6   No results for input(s): LIPASE, AMYLASE in the last 168 hours. No results for input(s): AMMONIA in the last 168 hours. CBC: Recent Labs  Lab 07/30/21 1625 07/31/21 0152 08/02/21 0254  WBC 3.5* 6.3 3.3*  NEUTROABS 2.1 4.8  --   HGB 11.7* 11.5* 10.1*  HCT 35.5* 32.9* 30.4*  MCV 97.5 93.2 96.2  PLT 344 312 285   Cardiac Enzymes: No results for input(s): CKTOTAL, CKMB, CKMBINDEX, TROPONINI in the last 168 hours. BNP: Invalid input(s): POCBNP CBG: Recent Labs  Lab 07/30/21 1626  GLUCAP 114*   D-Dimer No results for input(s): DDIMER in the last 72 hours. Hgb A1c No results for input(s): HGBA1C in the last 72 hours. Lipid  Profile Recent Labs    07/31/21 0152  CHOL 142  HDL 55  LDLCALC 77  TRIG 49  CHOLHDL 2.6   Thyroid function studies No results for  input(s): TSH, T4TOTAL, T3FREE, THYROIDAB in the last 72 hours.  Invalid input(s): FREET3 Anemia work up No results for input(s): VITAMINB12, FOLATE, FERRITIN, TIBC, IRON, RETICCTPCT in the last 72 hours. Urinalysis    Component Value Date/Time   COLORURINE STRAW (A) 07/31/2021 0320   APPEARANCEUR CLEAR 07/31/2021 0320   LABSPEC 1.025 07/31/2021 0320   PHURINE 7.0 07/31/2021 0320   GLUCOSEU NEGATIVE 07/31/2021 0320   HGBUR NEGATIVE 07/31/2021 0320   BILIRUBINUR NEGATIVE 07/31/2021 0320   KETONESUR NEGATIVE 07/31/2021 0320   PROTEINUR NEGATIVE 07/31/2021 0320   NITRITE NEGATIVE 07/31/2021 0320   LEUKOCYTESUR NEGATIVE 07/31/2021 0320    Microbiology Recent Results (from the past 240 hour(s))  Resp Panel by RT-PCR (Flu A&B, Covid) Nasopharyngeal Swab     Status: None   Collection Time: 07/30/21  4:51 PM   Specimen: Nasopharyngeal Swab; Nasopharyngeal(NP) swabs in vial transport medium  Result Value Ref Range Status   SARS Coronavirus 2 by RT PCR NEGATIVE NEGATIVE Final    Comment: (NOTE) SARS-CoV-2 target nucleic acids are NOT DETECTED.  The SARS-CoV-2 RNA is generally detectable in upper respiratory specimens during the acute phase of infection. The lowest concentration of SARS-CoV-2 viral copies this assay can detect is 138 copies/mL. A negative result does not preclude SARS-Cov-2 infection and should not be used as the sole basis for treatment or other patient management decisions. A negative result may occur with  improper specimen collection/handling, submission of specimen other than nasopharyngeal swab, presence of viral mutation(s) within the areas targeted by this assay, and inadequate number of viral copies(<138 copies/mL). A negative result must be combined with clinical observations, patient history, and  epidemiological information. The expected result is Negative.  Fact Sheet for Patients:  BloggerCourse.com  Fact Sheet for Healthcare Providers:  SeriousBroker.it  This test is no t yet approved or cleared by the [place] FDA and  has been authorized for detection and/or diagnosis of SARS-CoV-2 by FDA under an Emergency Use Authorization (EUA). This EUA will remain  in effect (meaning this test can be used) for the duration of the COVID-19 declaration under Section 564(b)(1) of the Act, 21 U.S.C.section 360bbb-3(b)(1), unless the authorization is terminated  or revoked sooner.       Influenza A by PCR NEGATIVE NEGATIVE Final   Influenza B by PCR NEGATIVE NEGATIVE Final    Comment: (NOTE) The Xpert Xpress SARS-CoV-2/FLU/RSV plus assay is intended as an aid in the diagnosis of influenza from Nasopharyngeal swab specimens and should not be used as a sole basis for treatment. Nasal washings and aspirates are unacceptable for Xpert Xpress SARS-CoV-2/FLU/RSV testing.  Fact Sheet for Patients: BloggerCourse.com  Fact Sheet for Healthcare Providers: SeriousBroker.it  This test is not yet approved or cleared by the [place] FDA and has been authorized for detection and/or diagnosis of SARS-CoV-2 by FDA under an Emergency Use Authorization (EUA). This EUA will remain in effect (meaning this test can be used) for the duration of the COVID-19 declaration under Section 564(b)(1) of the Act, 21 U.S.C. section 360bbb-3(b)(1), unless the authorization is terminated or revoked.  Performed at [facility], [address], [place], [place] [address]     Time coordinating discharge: Approximately 40 minutes  Tyrone Nine, MD  [facility] 08/02/2021, 10:16 AM

## 2021-08-02 NOTE — Progress Notes (Addendum)
STROKE TEAM PROGRESS NOTE   INTERVAL HISTORY Her daughter is at the bedside. Pt neuro stable, no acute event overnight. Had TEE showed small PFO, had loop recorder placed.   Vitals:   08/02/21 1245 08/02/21 1300 08/02/21 1304 08/02/21 1714  BP: 108/68 131/64 132/82 (!) 149/86  Pulse: (!) 47 (!) 45 (!) 54 (!) 59  Resp: 11 15 14 19   Temp: 98.5 F (36.9 C)  98 F (36.7 C) 98.1 F (36.7 C)  TempSrc:    Oral  SpO2: 99% 96% 100% 99%  Weight:      Height:       CBC:  Recent Labs  Lab 07/30/21 1625 07/31/21 0152 08/02/21 0254  WBC 3.5* 6.3 3.3*  NEUTROABS 2.1 4.8  --   HGB 11.7* 11.5* 10.1*  HCT 35.5* 32.9* 30.4*  MCV 97.5 93.2 96.2  PLT 344 312 [id number]   Basic Metabolic Panel:  Recent Labs  Lab 07/31/21 0152 08/01/21 0227  NA 134* 135  K 3.4* 4.1  CL 103 104  CO2 24 23  GLUCOSE 108* 95  BUN 9 16  CREATININE 0.64 0.62  CALCIUM 9.3 8.9  MG 2.1  --    Lipid Panel:  Recent Labs  Lab 07/31/21 0152  CHOL 142  TRIG 49  HDL 55  CHOLHDL 2.6  VLDL 10  LDLCALC 77   HgbA1c:  Recent Labs  Lab 07/31/21 0152  HGBA1C 5.9*   Urine Drug Screen: No results for input(s): LABOPIA, COCAINSCRNUR, LABBENZ, AMPHETMU, THCU, LABBARB in the last 168 hours.  Alcohol Level No results for input(s): ETH in the last 168 hours.  IMAGING past 24 hours ECHO TEE  Result Date: 08/02/2021    TRANSESOPHOGEAL ECHO REPORT   Patient Name:   Kim [name] Date of Exam: 08/02/2021 Medical Rec #:  [id number]     Height:       60.0 in Accession #:    [id number]    Weight:       117.9 lb Date of Birth:  02-12-1946     BSA:          1.491 m Patient Age:    76 years      BP:           180/76 mmHg Patient Gender: F             HR:           54 bpm. Exam Location:  Inpatient Procedure: 3D Echo, Transesophageal Echo, Cardiac Doppler, Color Doppler and            Saline Contrast Bubble Study Indications:     Stroke  History:         Patient has prior history of Echocardiogram examinations, most                   recent 06/28/2021. Stroke; Risk Factors:Hypertension and                  Dyslipidemia.  Sonographer:     Darlina Sicilian RDCS Referring Phys:  [id number] Darreld Mclean Diagnosing Phys: Candee Furbish MD PROCEDURE: After discussion of the risks and benefits of a TEE, an informed consent was obtained from the patient. TEE procedure time was 11 minutes. The transesophogeal probe was passed without difficulty through the esophogus of the patient. Imaged were obtained with the patient in a left lateral decubitus position. Local oropharyngeal anesthetic was provided with Cetacaine. Sedation performed by different physician. The patient was monitored while  under deep sedation. Anesthestetic sedation was provided intravenously by Anesthesiology: 239.35mg  of Propofol, 40mg  of Lidocaine. Image quality was good. The patient's vital signs; including heart rate, blood pressure, and oxygen saturation; remained stable throughout the procedure. The patient developed no complications during the procedure. IMPRESSIONS  1. PFO present.  2. Left ventricular ejection fraction, by estimation, is 60 to 65%. The left ventricle has normal function. The left ventricle has no regional wall motion abnormalities.  3. Right ventricular systolic function is normal. The right ventricular size is normal.  4. No left atrial/left atrial appendage thrombus was detected.  5. The mitral valve is normal in structure. Mild mitral valve regurgitation. No evidence of mitral stenosis.  6. The aortic valve is tricuspid. There is mild calcification of the aortic valve. There is mild thickening of the aortic valve. Aortic valve regurgitation is mild. Aortic valve sclerosis is present, with no evidence of aortic valve stenosis.  7. Aortic dilatation noted. There is borderline dilatation of the ascending aorta, measuring 39 mm.  8. The inferior vena cava is normal in size with greater than 50% respiratory variability, suggesting right atrial pressure of 3 mmHg.  9.  Agitated saline contrast bubble study was positive with shunting observed within 3-6 cardiac cycles suggestive of interatrial shunt. There is a small patent foramen ovale with bidirectional shunting across atrial septum. FINDINGS  Left Ventricle: Left ventricular ejection fraction, by estimation, is 60 to 65%. The left ventricle has normal function. The left ventricle has no regional wall motion abnormalities. The left ventricular internal cavity size was normal in size. There is  no left ventricular hypertrophy. Right Ventricle: The right ventricular size is normal. No increase in right ventricular wall thickness. Right ventricular systolic function is normal. Left Atrium: Left atrial size was normal in size. No left atrial/left atrial appendage thrombus was detected. Right Atrium: Right atrial size was normal in size. Pericardium: There is no evidence of pericardial effusion. Mitral Valve: The mitral valve is normal in structure. Mild mitral valve regurgitation. No evidence of mitral valve stenosis. Tricuspid Valve: The tricuspid valve is normal in structure. Tricuspid valve regurgitation is not demonstrated. No evidence of tricuspid stenosis. Aortic Valve: The aortic valve is tricuspid. There is mild calcification of the aortic valve. There is mild thickening of the aortic valve. Aortic valve regurgitation is mild. Aortic valve sclerosis is present, with no evidence of aortic valve stenosis. Pulmonic Valve: The pulmonic valve was normal in structure. Pulmonic valve regurgitation is not visualized. No evidence of pulmonic stenosis. Aorta: Aortic dilatation noted. There is borderline dilatation of the ascending aorta, measuring 39 mm. Venous: The inferior vena cava is normal in size with greater than 50% respiratory variability, suggesting right atrial pressure of 3 mmHg. IAS/Shunts: No atrial level shunt detected by color flow Doppler. Agitated saline contrast was given intravenously to evaluate for intracardiac  shunting. Agitated saline contrast bubble study was positive with shunting observed within 3-6 cardiac cycles suggestive of interatrial shunt. A small patent foramen ovale is detected with bidirectional shunting across atrial septum. Additional Comments: PFO present.  LEFT VENTRICLE PLAX 2D LVOT diam:     1.90 cm LVOT Area:     2.84 cm   AORTA Ao Asc diam: 3.90 cm  SHUNTS Systemic Diam: 1.90 cm Candee Furbish MD Electronically signed by Candee Furbish MD Signature Date/Time: 08/02/2021/1:07:58 PM    Final     PHYSICAL EXAM General: Alert, well-developed, well-nourished patient in no acute distress   NEURO:  Mental Status: AA&Ox3  Speech/Language: speech is without dysarthria or aphasia.    Cranial Nerves:  II: PERRL. Visual fields full.  III, IV, VI: EOMI. Eyelids elevate symmetrically.  V: Sensation is intact to light touch and symmetrical to face.  VII: Smile is symmetrical.  VIII: hearing intact to voice. IX, X:  Phonation is normal.  XII: tongue is midline without fasciculations. Motor: 5/5 strength to all muscle groups tested.  Tone: is normal and bulk is normal Sensation- Intact to light touch bilaterally.   Coordination: FTN intact bilaterally. No drift.  Gait- deferred   ASSESSMENT/PLAN Kim [name] is a 76 y.o. female with history of HTN, HLD and SLE presenting with a transient episode of right sided weakness and facial droop for about 15 minutes.  MRI showed left parietal lobe infarct.  Will plan for TEE to further evaluate for cardioembolic source of stroke and implant loop recorder if TEE is negative.  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 Left M2 stenosis  Code Stroke CT head No acute abnormality. ASPECTS 10.    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   Loop recorder placed Given history of SLE, antiphospholipid antibodies checked, so far negative LDL 77 HgbA1c 5.9 VTE prophylaxis - SQH aspirin 81 mg daily and clopidogrel 75 mg daily prior to admission, now on aspirin 325 mg daily and clopidogrel 75 mg daily for 3 months and then aspirin alone given left M2 stenosis.  Therapy recommendations:  home health PT Disposition:  pending  History of stroke 06/2021 admitted for dizziness and nausea vomiting.  CT no acute abnormality.  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  MRI showed left SCA infarct.  EF 60 to 65%.  LDL 73, A1c 5.9.  Discharged on DAPT and Crestor 20  SLE Continue home Imuran and hydroxychloroquine  TEE no marantic endocarditis   Antiphospholipid antibodies so far negative ANA pending Anti-dsDNA and smith ab neg  Hypertension Home meds:  losartan 100 mg daily, valsartan 320 mg daily Stable Long-term BP goal normotensive  Hyperlipidemia Home meds:  rosuvastatin 20 mg daily, resumed in hospital LDL 77, goal < 70 Increase rosuvastatin to 40 mg daily Continue statin at discharge  Other Stroke Risk Factors Advanced Age >/= 73  Hx stroke  Other Active Problems RLS  Hospital day # 2  Neurology will sign off. Please call with questions. Pt will follow up with stroke clinic Dr. Leonie Man at [facility] on 09/01/21. Thanks for the consult.   Rosalin Hawking, MD PhD Stroke Neurology 08/02/2021 5:22 PM     To contact Stroke Continuity provider, please refer to [email or web address] After hours, contact General Neurology

## 2021-08-02 NOTE — Discharge Instructions (Signed)

## 2021-08-02 NOTE — Plan of Care (Signed)
°  Problem: Education: Goal: Knowledge of disease or condition will improve 08/02/2021 1303 by Deliah Boston, RN Outcome: Progressing 08/02/2021 1300 by Deliah Boston, RN Outcome: Progressing Goal: Knowledge of secondary prevention will improve (SELECT ALL) 08/02/2021 1303 by Deliah Boston, RN Outcome: Progressing 08/02/2021 1300 by Deliah Boston, RN Outcome: Progressing   Problem: Self-Care: Goal: Ability to participate in self-care as condition permits will improve 08/02/2021 1303 by Deliah Boston, RN Outcome: Progressing 08/02/2021 1300 by Deliah Boston, RN Outcome: Progressing   Problem: Ischemic Stroke/TIA Tissue Perfusion: Goal: Complications of ischemic stroke/TIA will be minimized 08/02/2021 1303 by Deliah Boston, RN Outcome: Progressing 08/02/2021 1300 by Deliah Boston, RN Outcome: Progressing

## 2021-08-03 ENCOUNTER — Encounter (HOSPITAL_COMMUNITY): Payer: Self-pay | Admitting: Cardiology

## 2021-08-03 LAB — LUPUS ANTICOAGULANT PANEL
DRVVT: 46.4 s (ref 0.0–47.0)
PTT Lupus Anticoagulant: 48 s (ref 0.0–51.9)

## 2021-08-03 NOTE — Anesthesia Postprocedure Evaluation (Signed)
Anesthesia Post Note  Patient: Kim [name] Care  Procedure(s) Performed: TRANSESOPHAGEAL ECHOCARDIOGRAM (TEE) BUBBLE STUDY     Patient location during evaluation: PACU Anesthesia Type: MAC Level of consciousness: awake and alert Pain management: pain level controlled Vital Signs Assessment: post-procedure vital signs reviewed and stable Respiratory status: spontaneous breathing, nonlabored ventilation, respiratory function stable and patient connected to nasal cannula oxygen Cardiovascular status: stable and blood pressure returned to baseline Postop Assessment: no apparent nausea or vomiting Anesthetic complications: no   No notable events documented.  Last Vitals:  Vitals:   08/02/21 1304 08/02/21 1714  BP: 132/82 (!) 149/86  Pulse: (!) 54 (!) 59  Resp: 14 19  Temp: 36.7 C 36.7 C  SpO2: 100% 99%    Last Pain:  Vitals:   08/02/21 1714  TempSrc: Oral  PainSc:                  Kim [name]

## 2021-08-04 LAB — FANA STAINING PATTERNS: Homogeneous Pattern: 1

## 2021-08-04 LAB — ANTINUCLEAR ANTIBODIES, IFA: ANA Ab, IFA: POSITIVE — AB

## 2021-08-04 LAB — PHOSPHATIDYLSERINE ANTIBODIES
Phosphatydalserine, IgA: 2 APS Units (ref 0–19)
Phosphatydalserine, IgG: 9 Units (ref 0–30)
Phosphatydalserine, IgM: 113 Units — ABNORMAL HIGH (ref 0–30)

## 2021-08-12 ENCOUNTER — Other Ambulatory Visit: Payer: Self-pay

## 2021-08-12 ENCOUNTER — Ambulatory Visit (INDEPENDENT_AMBULATORY_CARE_PROVIDER_SITE_OTHER): Payer: Medicare Other

## 2021-08-12 DIAGNOSIS — G459 Transient cerebral ischemic attack, unspecified: Secondary | ICD-10-CM

## 2021-08-12 LAB — CUP PACEART INCLINIC DEVICE CHECK
Date Time Interrogation Session: 20230202085422
Implantable Pulse Generator Implant Date: 20230123

## 2021-08-12 NOTE — Progress Notes (Signed)
ILR wound check in clinic. Steri strips removed prior to visit. Wound well healed.  Bruising noted around site, appear soft, non-tender.   Home monitor transmitting nightly- monthly summaries scheduled, next summary on 09/06/21. No episodes. Questions answered.

## 2021-08-12 NOTE — Patient Instructions (Signed)
° °  After Your Loop Recorder (ILR) ° ° °Monitor implant site for redness, swelling, and drainage. Call the device clinic at [phone] if you experience these symptoms or fever/chills. ° °You may shower 3 days after your loop recorder implant and wash your incision with soap and water. Avoid lotions, ointments, or perfumes over your incision until it is well-healed. ° °You may use a hot tub or a pool after your wound check appointment if the incision is completely closed. ° ° °Your ILR is MRI compatible. ° °Remote monitoring is used to monitor your pacemaker from home. This monitoring is scheduled every 31 days by our office.  ° °

## 2021-09-01 ENCOUNTER — Encounter: Payer: Self-pay | Admitting: Neurology

## 2021-09-01 ENCOUNTER — Ambulatory Visit (INDEPENDENT_AMBULATORY_CARE_PROVIDER_SITE_OTHER): Payer: Medicare Other | Admitting: Neurology

## 2021-09-01 VITALS — BP 165/79 | HR 58 | Ht 60.0 in | Wt 117.0 lb

## 2021-09-01 DIAGNOSIS — I6602 Occlusion and stenosis of left middle cerebral artery: Secondary | ICD-10-CM

## 2021-09-01 DIAGNOSIS — I639 Cerebral infarction, unspecified: Secondary | ICD-10-CM | POA: Diagnosis not present

## 2021-09-01 NOTE — Patient Instructions (Signed)
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 I also advised the patient to eat a healthy diet with plenty of whole grains, cereals, fruits and vegetables, exercise regularly and maintain ideal body weight.  She was encouraged to use a cane or walker while ambulating at all times and discussed fall safety precautions.  Followup in the future with me in 3 months or call earlier if necessary Fall Prevention in the Home, Adult Falls can cause injuries and can happen to people of all ages. There are many things you can do to make your home safe and to help prevent falls. Ask for help when making these changes. What actions can I take to prevent falls? General Instructions Use good lighting in all rooms. Replace any light bulbs that burn out. Turn on the lights in dark areas. Use night-lights. Keep items that you use often in easy-to-reach places. Lower the shelves around your home if needed. Set up your furniture so you have a clear path. Avoid moving your furniture around. Do not have throw rugs or other things on the floor that can make you trip. Avoid walking on wet floors. If any of your floors are uneven, fix them. Add color or contrast paint or tape to clearly mark and help you see: Grab bars or handrails. First and last steps of staircases. Where the edge of each step is. If you use a stepladder: Make sure that it is fully opened. Do not climb a closed  stepladder. Make sure the sides of the stepladder are locked in place. Ask someone to hold the stepladder while you use it. Know where your pets are when moving through your home. What can I do in the bathroom?   Keep the floor dry. Clean up any water on the floor right away. Remove soap buildup in the tub or shower. Use nonskid mats or decals on the floor of the tub or shower. Attach bath mats securely with double-sided, nonslip rug tape. If you need to sit down in the shower, use a plastic, nonslip stool. Install grab bars by the toilet and in the tub and shower. Do not use towel bars as grab bars. What can I do in the bedroom? Make sure that you have a light by your bed that is easy to reach. Do not use any sheets or blankets for your bed that hang to the floor. Have a firm chair with side arms that you can use for support when you get dressed. What can I do in the kitchen? Clean up any spills right away. If you need to reach something above you, use a step stool with a grab bar. Keep electrical cords out of the way. Do not use floor polish or wax that makes floors slippery. What can I do with my stairs? Do not leave any items on the stairs. Make sure that you have a light switch at the top and the bottom of the stairs. Make sure that there are handrails on both sides of the stairs. Fix handrails  that are broken or loose. Install nonslip stair treads on all your stairs. Avoid having throw rugs at the top or bottom of the stairs. Choose a carpet that does not hide the edge of the steps on the stairs. Check carpeting to make sure that it is firmly attached to the stairs. Fix carpet that is loose or worn. What can I do on the outside of my home? Use bright outdoor lighting. Fix the edges of walkways and driveways and fix any cracks. Remove anything that might make you trip as you walk through a door, such as a raised step or threshold. Trim any bushes or trees on paths to your  home. Check to see if handrails are loose or broken and that both sides of all steps have handrails. Install guardrails along the edges of any raised decks and porches. Clear paths of anything that can make you trip, such as tools or rocks. Have leaves, snow, or ice cleared regularly. Use sand or salt on paths during winter. Clean up any spills in your garage right away. This includes grease or oil spills. What other actions can I take? Wear shoes that: Have a low heel. Do not wear high heels. Have rubber bottoms. Feel good on your feet and fit well. Are closed at the toe. Do not wear open-toe sandals. Use tools that help you move around if needed. These include: Canes. Walkers. Scooters. Crutches. Review your medicines with your doctor. Some medicines can make you feel dizzy. This can increase your chance of falling. Ask your doctor what else you can do to help prevent falls. Where to find more information Centers for Disease Control and Prevention, STEADI: http://www.wolf.info/ National Institute on Aging: http://kim-miller.com/ Contact a doctor if: You are afraid of falling at home. You feel weak, drowsy, or dizzy at home. You fall at home. Summary There are many simple things that you can do to make your home safe and to help prevent falls. Ways to make your home safe include removing things that can make you trip and installing grab bars in the bathroom. Ask for help when making these changes in your home. This information is not intended to replace advice given to you by your health care provider. Make sure you discuss any questions you have with your health care provider. Document Revised: 01/29/2020 Document Reviewed: 01/29/2020 Elsevier Patient Education  [place].  Stroke Prevention Some medical conditions and behaviors can lead to a higher chance of having a stroke. You can help prevent a stroke by eating healthy, exercising, not smoking, and managing any medical conditions  you have. Stroke is a leading cause of functional impairment. Primary prevention is particularly important because a majority of strokes are first-time events. Stroke changes the lives of not only those who experience a stroke but also their family and other caregivers. How can this condition affect me? A stroke is a medical emergency and should be treated right away. A stroke can lead to brain damage and can sometimes be life-threatening. If a person gets medical treatment right away, there is a better chance of surviving and recovering from a stroke. What can increase my risk? The following medical conditions may increase your risk of a stroke: Cardiovascular disease. High blood pressure (hypertension). Diabetes. High cholesterol. Sickle cell disease. Blood clotting disorders (hypercoagulable state). Obesity. Sleep disorders (obstructive sleep apnea). Other risk factors include: Being older than age 67. Having a history of blood clots, stroke, or mini-stroke (transient ischemic attack, TIA). Genetic factors,  such as race, ethnicity, or a family history of stroke. Smoking cigarettes or using other tobacco products. Taking birth control pills, especially if you also use tobacco. Heavy use of alcohol or drugs, especially cocaine and methamphetamine. Physical inactivity. What actions can I take to prevent this? Manage your health conditions High cholesterol levels. Eating a healthy diet is important for preventing high cholesterol. If cholesterol cannot be managed through diet alone, you may need to take medicines. Take any prescribed medicines to control your cholesterol as told by your health care provider. Hypertension. To reduce your risk of stroke, try to keep your blood pressure below 130/80. Eating a healthy diet and exercising regularly are important for controlling blood pressure. If these steps are not enough to manage your blood pressure, you may need to take medicines. Take  any prescribed medicines to control hypertension as told by your health care provider. Ask your health care provider if you should monitor your blood pressure at home. Have your blood pressure checked every year, even if your blood pressure is normal. Blood pressure increases with age and some medical conditions. Diabetes. Eating a healthy diet and exercising regularly are important parts of managing your blood sugar (glucose). If your blood sugar cannot be managed through diet and exercise, you may need to take medicines. Take any prescribed medicines to control your diabetes as told by your health care provider. Get evaluated for obstructive sleep apnea. Talk to your health care provider about getting a sleep evaluation if you snore a lot or have excessive sleepiness. Make sure that any other medical conditions you have, such as atrial fibrillation or atherosclerosis, are managed. Nutrition Follow instructions from your health care provider about what to eat or drink to help manage your health condition. These instructions may include: Reducing your daily calorie intake. Limiting how much salt (sodium) you use to 1,500 milligrams (mg) each day. Using only healthy fats for cooking, such as olive oil, canola oil, or sunflower oil. Eating healthy foods. You can do this by: Choosing foods that are high in fiber, such as whole grains, and fresh fruits and vegetables. Eating at least 5 servings of fruits and vegetables a day. Try to fill one-half of your plate with fruits and vegetables at each meal. Choosing lean protein foods, such as lean cuts of meat, poultry without skin, fish, tofu, beans, and nuts. Eating low-fat dairy products. Avoiding foods that are high in sodium. This can help lower blood pressure. Avoiding foods that have saturated fat, trans fat, and cholesterol. This can help prevent high cholesterol. Avoiding processed and prepared foods. Counting your daily carbohydrate  intake.  Lifestyle If you drink alcohol: Limit how much you have to: 0-1 drink a day for women who are not pregnant. 0-2 drinks a day for men. Know how much alcohol is in your drink. In the [place] one drink equals one 12 oz bottle of beer (345mL), one 5 oz glass of wine (149mL), or one 1 oz glass of hard liquor (43mL). Do not use any products that contain nicotine or tobacco. These products include cigarettes, chewing tobacco, and vaping devices, such as e-cigarettes. If you need help quitting, ask your health care provider. Avoid secondhand smoke. Do not use drugs. Activity  Try to stay at a healthy weight. Get at least 30 minutes of exercise on most days, such as: Fast walking. Biking. Swimming. Medicines Take over-the-counter and prescription medicines only as told by your health care provider. Aspirin or blood thinners (antiplatelets or anticoagulants)  may be recommended to reduce your risk of forming blood clots that can lead to stroke. Avoid taking birth control pills. Talk to your health care provider about the risks of taking birth control pills if: You are over 47 years old. You smoke. You get very bad headaches. You have had a blood clot. Where to find more information American Stroke Association: www.strokeassociation.org Get help right away if: You or a loved one has any symptoms of a stroke. "BE FAST" is an easy way to remember the main warning signs of a stroke: B - Balance. Signs are dizziness, sudden trouble walking, or loss of balance. E - Eyes. Signs are trouble seeing or a sudden change in vision. F - Face. Signs are sudden weakness or numbness of the face, or the face or eyelid drooping on one side. A - Arms. Signs are weakness or numbness in an arm. This happens suddenly and usually on one side of the body. S - Speech. Signs are sudden trouble speaking, slurred speech, or trouble understanding what people say. T - Time. Time to call emergency services. Write  down what time symptoms started. You or a loved one has other signs of a stroke, such as: A sudden, severe headache with no known cause. Nausea or vomiting. Seizure. These symptoms may represent a serious problem that is an emergency. Do not wait to see if the symptoms will go away. Get medical help right away. Call your local emergency services (911 in the [place] Do not drive yourself to the hospital. Summary You can help to prevent a stroke by eating healthy, exercising, not smoking, limiting alcohol intake, and managing any medical conditions you may have. Do not use any products that contain nicotine or tobacco. These include cigarettes, chewing tobacco, and vaping devices, such as e-cigarettes. If you need help quitting, ask your health care provider. Remember "BE FAST" for warning signs of a stroke. Get help right away if you or a loved one has any of these signs. This information is not intended to replace advice given to you by your health care provider. Make sure you discuss any questions you have with your health care provider. Document Revised: 01/27/2020 Document Reviewed: 01/27/2020 Elsevier Patient Education  [place].

## 2021-09-01 NOTE — Progress Notes (Signed)
[facility] [address] [place] [address] [phone]       OFFICE FOLLOW-UP NOTE  [name] Date of Birth:  1946-04-17 Medical Record Number:  [id number]   HPI: Kim [name] is a 76 year old pleasant [place] Asian [place] origin lady who is seen for first office follow-up visit following stroke.  She is accompanied by her daughter.  History is obtained from her and review of electronic medical records and I personally reviewed available imaging films in PACS.  She has past medical history of SLE, hypertension, hyperlipidemia, GERD, restless leg syndrome and osteoporosis.  She presented on 06/26/2021 with sudden onset of dizziness nausea and vomiting and lethargy.  She felt like she was walking like a drunk.  There is no accompanying diplopia headache focal weakness or numbness.  Neurological exam showed slow saccades on left lateral gaze, truncal ataxia but normal strength sensation and coordination.  CT scan was unremarkable.  CT angiogram showed 2 mm outpouching from right ICA terminus moderate calcified stenosis in the left M2 and moderate narrowing of the left P2.  2D echo showed ejection fraction of 60 to 65% with moderate left ventricular hypertrophy.  LDL cholesterol 73 mg percent hemoglobin A1c 5.9.  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  Stroke etiology was felt to be cryptogenic in recommended loop recorder and started on aspirin 81 and Plavix 75 for 3 weeks and inpatient rehab.  She however returned on 07/31/2021 with transient episode of right sided weakness and facial droop lasting 15 minutes.  CT head was negative for acute abnormality and CT angiogram showed stable appearance of previously seen 40% stenosis of proximal right ICA and left M2 calcified stenosis and left P2 stenosis.  MRI showed 1.5 cm acute infarct in the left parietal lobe and showed the previously seen remote  hemorrhagic left superior cerebellar infarct.  2D echo was unremarkable TEE was obtained which showed no evidence of marantic endocarditis or clot but did show small PFO.  She had loop recorder placed during this admission.  Anticardiolipin antibodies were also checked which were negative.  LDL cholesterol 77 mg percent hemoglobin A1c 5.9.  She had previously been on aspirin 81 mg and the dose was increased to 325 mg and Plavix 75 mg was continued for up planned duration of 3 months followed by aspirin alone given left M2 stenosis.  Patient has done well since discharge.  Her right-sided weakness has not returned.  She continues to have mild gait and balance difficulties but can ambulate fairly well using a cane and walker.  She has had no falls or injuries.  She does have some increased bruising since aspirin dose was increased.  She is still getting physical and occupational therapy which is now down to once a week or so.  She is able to get up and walk around and handle her own affairs and can be left alone for several hours.  She has no new complaints today.  Loop recorder interrogation so far has not shown paroxysmal A-fib  ROS:   14 system review of systems is positive for balance difficulties, walking difficulties all other systems negative  PMH:  Past Medical History:  Diagnosis Date   Arthritis    Depression    Essential hypertension 06/28/2021   Mixed hyperlipidemia 06/28/2021    Social History:  Social History   Socioeconomic History   Marital status: Married    Spouse name:  Not on file   Number of children: Not on file   Years of education: Not on file   Highest education level: Not on file  Occupational History   Not on file  Tobacco Use   Smoking status: Never   Smokeless tobacco: Never  Vaping Use   Vaping Use: Never used  Substance and Sexual Activity   Alcohol use: No   Drug use: No   Sexual activity: Not on file  Other Topics Concern   Not on file  Social History  Narrative   Not on file   Social Determinants of Health   Financial Resource Strain: Not on file  Food Insecurity: Not on file  Transportation Needs: Not on file  Physical Activity: Not on file  Stress: Not on file  Social Connections: Not on file  Intimate Partner Violence: Not on file    Medications:   Current Outpatient Medications on File Prior to Visit  Medication Sig Dispense Refill   acetaminophen (TYLENOL) 325 MG tablet Take 2 tablets (650 mg total) by mouth every 6 (six) hours as needed for mild pain (or Fever >/= 101).     amLODipine (NORVASC) 5 MG tablet Take 5 mg by mouth daily.     aspirin 325 MG EC tablet Take 1 tablet (325 mg total) by mouth daily. 30 tablet 2   azaTHIOprine (IMURAN) 50 MG tablet Take 50 mg by mouth daily.     Capsaicin 0.1 % CREA Apply 1 application topically 2 (two) times daily as needed (tingling  and numbness to plantar foot).     clopidogrel (PLAVIX) 75 MG tablet Take 75 mg by mouth daily.     hydrALAZINE (APRESOLINE) 50 MG tablet Take 50 mg by mouth in the morning and at bedtime.     hydroxychloroquine (PLAQUENIL) 200 MG tablet Take 200 mg by mouth daily.     losartan (COZAAR) 100 MG tablet Take 100 mg by mouth daily.     magnesium oxide (MAG-OX) 400 MG tablet Take 400 mg by mouth in the morning and at bedtime.     polyethylene glycol (MIRALAX / GLYCOLAX) 17 g packet Take 17 g by mouth daily as needed for mild constipation. 14 each 0   rOPINIRole (REQUIP) 1 MG tablet Take 1 mg by mouth at bedtime.     rosuvastatin (CRESTOR) 40 MG tablet Take 1 tablet (40 mg total) by mouth daily. 30 tablet 0   senna-docusate (SENOKOT-S) 8.6-50 MG tablet Take 1 tablet by mouth 2 (two) times daily.     sodium chloride 1 g tablet Take 1 g by mouth 3 (three) times daily.     traMADol (ULTRAM) 50 MG tablet Take 50 mg by mouth at bedtime.     Vitamin D, Ergocalciferol, (DRISDOL) 1.25 MG (50000 UNIT) CAPS capsule Take 50,000 Units by mouth every Thursday.     No  current facility-administered medications on file prior to visit.    Allergies:   Allergies  Allergen Reactions   Beef-Derived Products    Chicken Protein     vegetarian   Fish-Derived Products     vegetarian   Gabapentin Other (See Comments)    unknown unknown    Labetalol Other (See Comments)    Bradycardia   Lisinopril Other (See Comments)   Nebivolol Other (See Comments)    Bradycardia,   Pork-Derived Products     Vegetarian (pt accepts heparin products 07/31/21)    Physical Exam General: Frail elderly [place] Asian lady seated, in no  evident distress Head: head normocephalic and atraumatic.  Neck: supple with no carotid or supraclavicular bruits Cardiovascular: regular rate and rhythm, no murmurs Musculoskeletal: no deformity except mild kyphoscoliosis. Skin:  no rash/petichiae Vascular:  Normal pulses all extremities Vitals:   09/01/21 1109  BP: (!) 165/79  Pulse: (!) 58   Neurologic Exam Mental Status: Awake and fully alert. Oriented to place and time. Recent and remote memory intact. Attention span, concentration and fund of knowledge appropriate. Mood and affect appropriate.  Cranial Nerves: Fundoscopic exam reveals sharp disc margins. Pupils equal, briskly reactive to light. Extraocular movements full without nystagmus. Visual fields full to confrontation. Hearing intact. Facial sensation intact. Face, tongue, palate moves normally and symmetrically.  Motor: Normal bulk and tone. Normal strength in all tested extremity muscles. Sensory.: intact to touch ,pinprick .position and vibratory sensation.  Coordination: Rapid alternating movements normal in all extremities. Finger-to-nose and heel-to-shin performed accurately bilaterally. Gait and Station: Arises from chair without difficulty. Stance is slightly stooped.  Uses a cane.. Gait slow and cautious but steady.  Unable to heel, toe and tandem walk without difficulty.  Reflexes: 1+ and symmetric. Toes downgoing.    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 I also advised the patient to eat a healthy diet with plenty of whole grains, cereals, fruits and vegetables, exercise regularly and maintain ideal body weight.  She was encouraged to use a cane or walker while ambulating at all times and discussed fall safety precautions.  Followup in the future with me in 3 months or call earlier if necessary Greater than 50% of time during this 40 minute visit was spent on counseling,explanation of diagnosis of recurrent and cryptogenic strokes and intracranial stenosis, planning of further management, discussion with patient and family and coordination of [name] Antony Contras, MD Note: This document was prepared with digital dictation and possible smart phrase technology. Any transcriptional errors that result from this process are unintentional

## 2021-09-06 ENCOUNTER — Ambulatory Visit (INDEPENDENT_AMBULATORY_CARE_PROVIDER_SITE_OTHER): Payer: Medicare Other

## 2021-09-06 DIAGNOSIS — G459 Transient cerebral ischemic attack, unspecified: Secondary | ICD-10-CM | POA: Diagnosis not present

## 2021-09-07 LAB — CUP PACEART REMOTE DEVICE CHECK
Date Time Interrogation Session: 20230227180046
Implantable Pulse Generator Implant Date: 20230123

## 2021-09-13 NOTE — Progress Notes (Signed)
Carelink Summary Report / Loop Recorder 

## 2021-10-11 ENCOUNTER — Ambulatory Visit (INDEPENDENT_AMBULATORY_CARE_PROVIDER_SITE_OTHER): Payer: Medicare Other

## 2021-10-11 DIAGNOSIS — G459 Transient cerebral ischemic attack, unspecified: Secondary | ICD-10-CM

## 2021-10-11 LAB — CUP PACEART REMOTE DEVICE CHECK
Date Time Interrogation Session: 20230401175940
Implantable Pulse Generator Implant Date: 20230123

## 2021-10-19 ENCOUNTER — Telehealth: Payer: Self-pay

## 2021-10-19 NOTE — Telephone Encounter (Signed)
LINQ alert received.  ?3 new AF events, 2-47min in duration, EGM's irregular R-R, noisy baseline, AF vs SR/ST with ectopy ?Burden 1.5%, DAPT, no OAC ?Route to triage ?LA ? ? ? ? ? ? ? ? ? ?

## 2021-10-25 NOTE — Progress Notes (Signed)
Carelink Summary Report / Loop Recorder 

## 2021-11-08 ENCOUNTER — Telehealth: Payer: Self-pay

## 2021-11-08 NOTE — Telephone Encounter (Signed)
LINQ alert received. ?2 new AF events, longest duration 28min, mean HR 125. Burden 5.3%, no OAC, DAPT ?Route to triage. ? ?Reviewed with Dr. Elberta Fortisamnitz and agrees AF. [facility]  ? ?Spoke to patients daughter Elita Quickam who is on DPR, explained AF and possible start of OAF. Advised someone will reach out to make apt. Appreciative of call and voiced understanding.  ? ?

## 2021-11-08 NOTE — Telephone Encounter (Signed)
Called and spoke with patient's daughter, Pam-DPR, she is agreeable to appt 11/09/21 with [facility].  Directions and phone number to [facility] provided to pt's daughter. ?

## 2021-11-09 ENCOUNTER — Ambulatory Visit (HOSPITAL_COMMUNITY)
Admission: RE | Admit: 2021-11-09 | Discharge: 2021-11-09 | Disposition: A | Discharge status: Home / Self Care | Payer: Medicare Other | Source: Ambulatory Visit | Attending: Nurse Practitioner | Admitting: Nurse Practitioner

## 2021-11-09 ENCOUNTER — Encounter (HOSPITAL_COMMUNITY): Payer: Self-pay | Admitting: Nurse Practitioner

## 2021-11-09 VITALS — BP 156/80 | HR 58 | Ht 60.0 in | Wt 111.4 lb

## 2021-11-09 DIAGNOSIS — Z8673 Personal history of transient ischemic attack (TIA), and cerebral infarction without residual deficits: Secondary | ICD-10-CM | POA: Diagnosis not present

## 2021-11-09 DIAGNOSIS — I4891 Unspecified atrial fibrillation: Secondary | ICD-10-CM | POA: Diagnosis present

## 2021-11-09 DIAGNOSIS — Z79899 Other long term (current) drug therapy: Secondary | ICD-10-CM | POA: Insufficient documentation

## 2021-11-09 DIAGNOSIS — Z7982 Long term (current) use of aspirin: Secondary | ICD-10-CM | POA: Insufficient documentation

## 2021-11-09 MED ORDER — APIXABAN 5 MG PO TABS: 5.0000 mg | ORAL_TABLET | Freq: Two times a day (BID) | ORAL | 3 refills | Status: DC

## 2021-11-09 NOTE — Progress Notes (Signed)
? ?Primary Care Physician: Curt Jews, PA-C ?Referring Physician: Device clinic/Dr. Curt Bears   ? ? ?Kim [name] is a 76 y.o. 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 She had a device implanted and it recently showed afib, longest episode of 28 mins, burden was 5.3%. Per Dr. Quentin Ore pt was to come here to discuss anticoagulation. She is currently on asa and plavix. Per Dr. Leonie Man, she can stop asa/plavix to get started on eliquis. She is here with her daughter, Jeannene Patella, who acts as her interpretor. Walks  with a walker but no recent falls. Has not had any bleeding issues with her current anticoagulation for stroke.  ? ?Today, she denies symptoms of palpitations, chest pain, shortness of breath, orthopnea, PND, lower extremity edema, dizziness, presyncope, syncope, or neurologic sequela. The patient is tolerating medications without difficulties and is otherwise without complaint today.  ? ?Past Medical History:  ?Diagnosis Date  ? Arthritis   ? Depression   ? Essential hypertension 06/28/2021  ? Mixed hyperlipidemia 06/28/2021  ? ?Past Surgical History:  ?Procedure Laterality Date  ? BUBBLE STUDY  08/02/2021  ? Procedure: BUBBLE STUDY;  Surgeon: Jerline Pain, MD;  Location: [facility] ENDOSCOPY;  Service: Cardiovascular;;  ? LOOP RECORDER INSERTION N/A 08/02/2021  ? Procedure: LOOP RECORDER INSERTION;  Surgeon: Vickie Epley, MD;  Location: [facility];  Service: Cardiovascular;  Laterality: N/A;  ? TEE WITHOUT CARDIOVERSION N/A 08/02/2021  ? Procedure: TRANSESOPHAGEAL ECHOCARDIOGRAM (TEE);  Surgeon: Jerline Pain, MD;  Location: [facility] ENDOSCOPY;  Service: Cardiovascular;  Laterality: N/A;  ? ? ?Current Outpatient Medications  ?Medication Sig Dispense Refill  ? acetaminophen (TYLENOL) 325 MG tablet Take 2 tablets (650 mg total) by mouth every 6 (six) hours as needed for mild pain (or Fever >/= 101).    ? apixaban  (ELIQUIS) 5 MG TABS tablet Take 1 tablet (5 mg total) by mouth 2 (two) times daily. 60 tablet 3  ? Capsaicin 0.1 % CREA Apply 1 application topically 2 (two) times daily as needed (tingling  and numbness to plantar foot).    ? clotrimazole-betamethasone (LOTRISONE) cream Apply topically as needed.    ? famotidine (PEPCID) 20 MG tablet Take 1 tablet by mouth daily.    ? hydrALAZINE (APRESOLINE) 50 MG tablet Take 50 mg by mouth in the morning and at bedtime.    ? hydroxychloroquine (PLAQUENIL) 200 MG tablet Take 200 mg by mouth daily.    ? losartan (COZAAR) 100 MG tablet Take 100 mg by mouth daily.    ? magnesium oxide (MAG-OX) 400 MG tablet Take 400 mg by mouth every evening.    ? megestrol (MEGACE) 40 MG/ML suspension Taking 60mls -Only takes 2-3 times daily    ? polyethylene glycol (MIRALAX / GLYCOLAX) 17 g packet Take 17 g by mouth daily as needed for mild constipation. 14 each 0  ? rOPINIRole (REQUIP) 1 MG tablet Take 1 mg by mouth at bedtime.    ? rosuvastatin (CRESTOR) 40 MG tablet Take 1 tablet (40 mg total) by mouth daily. 30 tablet 0  ? senna-docusate (SENOKOT-S) 8.6-50 MG tablet Take 1 tablet by mouth every morning.    ? traMADol (ULTRAM) 50 MG tablet Take 50 mg by mouth at bedtime.    ? Vitamin D, Ergocalciferol, (DRISDOL) 1.25 MG (50000 UNIT) CAPS capsule Take 50,000 Units by mouth every Thursday.    ? ?No current facility-administered  medications for this encounter.  ? ? ?Allergies  ?Allergen Reactions  ? Beef-Derived Products   ? Chicken Protein   ?  vegetarian  ? Fish-Derived Products   ?  vegetarian  ? Gabapentin Other (See Comments)  ?  unknown ?unknown ?  ? Labetalol Other (See Comments)  ?  Bradycardia  ? Lisinopril Other (See Comments)  ? Nebivolol Other (See Comments)  ?  Bradycardia,  ? Norvasc [Amlodipine] Swelling  ? Pork-Derived Products   ?  Vegetarian (pt accepts heparin products 07/31/21)  ? ? ?Social History  ? ?Socioeconomic History  ? Marital status: Married  ?  Spouse name: Not on file   ? Number of children: Not on file  ? Years of education: Not on file  ? Highest education level: Not on file  ?Occupational History  ? Not on file  ?Tobacco Use  ? Smoking status: Never  ? Smokeless tobacco: Never  ?Vaping Use  ? Vaping Use: Never used  ?Substance and Sexual Activity  ? Alcohol use: No  ? Drug use: No  ? Sexual activity: Not on file  ?Other Topics Concern  ? Not on file  ?Social History Narrative  ? Not on file  ? ?Social Determinants of Health  ? ?Financial Resource Strain: Not on file  ?Food Insecurity: Not on file  ?Transportation Needs: Not on file  ?Physical Activity: Not on file  ?Stress: Not on file  ?Social Connections: Not on file  ?Intimate Partner Violence: Not on file  ? ? ?Family History  ?Problem Relation Age of Onset  ? Heart disease Neg Hx   ? ? ?ROS- All systems are reviewed and negative except as per the HPI above ? ?Physical Exam: ?Vitals:  ? 11/09/21 1517  ?BP: (!) 156/80  ?Pulse: (!) 58  ?Weight: 50.5 kg  ?Height: 5' (1.524 m)  ? ?Wt Readings from Last 3 Encounters:  ?11/09/21 50.5 kg  ?09/01/21 53.1 kg  ?08/02/21 53.5 kg  ? ? ?Labs: ?Lab Results  ?Component Value Date  ? NA 135 08/01/2021  ? K 4.1 08/01/2021  ? CL 104 08/01/2021  ? CO2 23 08/01/2021  ? GLUCOSE 95 08/01/2021  ? BUN 16 08/01/2021  ? CREATININE 0.62 08/01/2021  ? CALCIUM 8.9 08/01/2021  ? MG 2.1 07/31/2021  ? ?Lab Results  ?Component Value Date  ? INR 1.0 06/26/2021  ? ?Lab Results  ?Component Value Date  ? CHOL 142 07/31/2021  ? HDL 55 07/31/2021  ? [place] 77 07/31/2021  ? TRIG 49 07/31/2021  ? ? ? ?GEN- The patient is well appearing, alert and oriented x 3 today.   ?Head- normocephalic, atraumatic ?Eyes-  Sclera clear, conjunctiva pink ?Ears- hearing intact ?Oropharynx- clear ?Neck- supple, no JVP ?Lymph- no cervical lymphadenopathy ?Lungs- Clear to ausculation bilaterally, normal work of breathing ?Heart- Regular rate and rhythm, no murmurs, rubs or gallops, PMI not laterally displaced ?GI- soft, NT, ND, +  BS ?Extremities- no clubbing, cyanosis, or edema ?MS- no significant deformity or atrophy ?Skin- no rash or lesion ?Psych- euthymic mood, full affect ?Neuro- strength and sensation are intact ? ?EKG-Vent. rate 58 BPM ?PR interval 158 ms ?QRS duration 118 ms ?QT/QTcB 448/439 ms ?P-R-T axes -5 -67 5 ?Sinus bradycardia with sinus arrhythmia ?Left anterior fascicular block ?Left ventricular hypertrophy with QRS widening ( R in aVL , Cornell product , Romhilt-Estes ) ?Nonspecific ST and T wave abnormality ?Abnormal ECG ?When compared with ECG of 30-Jul-2021 16:18, ?PREVIOUS ECG IS PRESENT ? ?Signed    ?  ?   ?   ?   ?   ?   ?  LINQ alert received. ?2 new AF events, longest duration 104min, mean HR 125. Burden 5.3%, no OAC, DAPT ?Route to triage. ?  ?Reviewed with Dr. Curt Bears and agrees AF. Recommends AF clinic for possible [facility] therapy start.  ?  ?Spoke to patients daughter Jeannene Patella who is on DPR, explained AF and possible start of OAF. Advised someone will reach out to make apt. Appreciative of call and voiced understanding.   ?  ?  ? ? ?Assessment and Plan:  ?1. New onset afib  ?In the setting of recent CVA ?Spoke to Dr. Leonie Man and he was ok to stop asa/plavix  ?Will start eliquis 5 mg bid  ?CHA2DS2VASc score of 6 ?Bleeding precautions discussed   ?Continue remote checks with device clinic ?Will not add rate control at this time unless afib burden increases ? ?I will see back in one month with CBC/bmet  ? ?Geroge Baseman Kayleen Memos, ANP-C ?Afib Clinic ?[facility] ?[address] ?[place], [place] [address] ?[phone]  ? Marland Kitchen  ?

## 2021-11-09 NOTE — Patient Instructions (Signed)
Stop aspirin  Stop plavix   Start Eliquis 5mg twice a day 

## 2021-11-12 LAB — CUP PACEART REMOTE DEVICE CHECK
Date Time Interrogation Session: 20230504180102
Implantable Pulse Generator Implant Date: 20230123

## 2021-11-15 ENCOUNTER — Ambulatory Visit (INDEPENDENT_AMBULATORY_CARE_PROVIDER_SITE_OTHER): Payer: Medicare Other

## 2021-11-15 DIAGNOSIS — I63542 Cerebral infarction due to unspecified occlusion or stenosis of left cerebellar artery: Secondary | ICD-10-CM

## 2021-11-18 ENCOUNTER — Encounter (HOSPITAL_BASED_OUTPATIENT_CLINIC_OR_DEPARTMENT_OTHER): Payer: Self-pay

## 2021-11-18 ENCOUNTER — Emergency Department (HOSPITAL_BASED_OUTPATIENT_CLINIC_OR_DEPARTMENT_OTHER): Payer: Medicare Other

## 2021-11-18 ENCOUNTER — Other Ambulatory Visit: Payer: Self-pay

## 2021-11-18 ENCOUNTER — Inpatient Hospital Stay (HOSPITAL_BASED_OUTPATIENT_CLINIC_OR_DEPARTMENT_OTHER)
Admission: EM | Admit: 2021-11-18 | Discharge: 2021-11-29 | DRG: 682 | Disposition: A | Discharge status: Home Health | Payer: Medicare Other | Attending: Internal Medicine | Admitting: Internal Medicine

## 2021-11-18 DIAGNOSIS — K921 Melena: Secondary | ICD-10-CM | POA: Diagnosis present

## 2021-11-18 DIAGNOSIS — R339 Retention of urine, unspecified: Secondary | ICD-10-CM

## 2021-11-18 DIAGNOSIS — J9601 Acute respiratory failure with hypoxia: Secondary | ICD-10-CM | POA: Diagnosis not present

## 2021-11-18 DIAGNOSIS — E782 Mixed hyperlipidemia: Secondary | ICD-10-CM | POA: Diagnosis present

## 2021-11-18 DIAGNOSIS — N170 Acute kidney failure with tubular necrosis: Principal | ICD-10-CM | POA: Diagnosis present

## 2021-11-18 DIAGNOSIS — K5649 Other impaction of intestine: Secondary | ICD-10-CM | POA: Diagnosis present

## 2021-11-18 DIAGNOSIS — Z8673 Personal history of transient ischemic attack (TIA), and cerebral infarction without residual deficits: Secondary | ICD-10-CM

## 2021-11-18 DIAGNOSIS — I5032 Chronic diastolic (congestive) heart failure: Secondary | ICD-10-CM | POA: Diagnosis present

## 2021-11-18 DIAGNOSIS — N179 Acute kidney failure, unspecified: Secondary | ICD-10-CM

## 2021-11-18 DIAGNOSIS — R310 Gross hematuria: Secondary | ICD-10-CM | POA: Diagnosis present

## 2021-11-18 DIAGNOSIS — R627 Adult failure to thrive: Secondary | ICD-10-CM | POA: Diagnosis present

## 2021-11-18 DIAGNOSIS — E871 Hypo-osmolality and hyponatremia: Secondary | ICD-10-CM | POA: Diagnosis present

## 2021-11-18 DIAGNOSIS — K2970 Gastritis, unspecified, without bleeding: Secondary | ICD-10-CM | POA: Diagnosis present

## 2021-11-18 DIAGNOSIS — R338 Other retention of urine: Secondary | ICD-10-CM | POA: Diagnosis present

## 2021-11-18 DIAGNOSIS — K623 Rectal prolapse: Secondary | ICD-10-CM | POA: Diagnosis present

## 2021-11-18 DIAGNOSIS — Z79899 Other long term (current) drug therapy: Secondary | ICD-10-CM

## 2021-11-18 DIAGNOSIS — Z8711 Personal history of peptic ulcer disease: Secondary | ICD-10-CM

## 2021-11-18 DIAGNOSIS — Z888 Allergy status to other drugs, medicaments and biological substances status: Secondary | ICD-10-CM

## 2021-11-18 DIAGNOSIS — G2581 Restless legs syndrome: Secondary | ICD-10-CM | POA: Diagnosis present

## 2021-11-18 DIAGNOSIS — D631 Anemia in chronic kidney disease: Secondary | ICD-10-CM | POA: Diagnosis present

## 2021-11-18 DIAGNOSIS — K626 Ulcer of anus and rectum: Secondary | ICD-10-CM | POA: Diagnosis present

## 2021-11-18 DIAGNOSIS — E874 Mixed disorder of acid-base balance: Secondary | ICD-10-CM | POA: Diagnosis present

## 2021-11-18 DIAGNOSIS — Z8249 Family history of ischemic heart disease and other diseases of the circulatory system: Secondary | ICD-10-CM

## 2021-11-18 DIAGNOSIS — N136 Pyonephrosis: Secondary | ICD-10-CM | POA: Diagnosis present

## 2021-11-18 DIAGNOSIS — K5641 Fecal impaction: Secondary | ICD-10-CM | POA: Diagnosis present

## 2021-11-18 DIAGNOSIS — I130 Hypertensive heart and chronic kidney disease with heart failure and stage 1 through stage 4 chronic kidney disease, or unspecified chronic kidney disease: Secondary | ICD-10-CM | POA: Diagnosis present

## 2021-11-18 DIAGNOSIS — D62 Acute posthemorrhagic anemia: Secondary | ICD-10-CM | POA: Diagnosis present

## 2021-11-18 DIAGNOSIS — Z91018 Allergy to other foods: Secondary | ICD-10-CM

## 2021-11-18 DIAGNOSIS — I480 Paroxysmal atrial fibrillation: Secondary | ICD-10-CM | POA: Diagnosis present

## 2021-11-18 DIAGNOSIS — E860 Dehydration: Secondary | ICD-10-CM | POA: Diagnosis present

## 2021-11-18 DIAGNOSIS — I482 Chronic atrial fibrillation, unspecified: Secondary | ICD-10-CM | POA: Diagnosis present

## 2021-11-18 DIAGNOSIS — D649 Anemia, unspecified: Secondary | ICD-10-CM

## 2021-11-18 DIAGNOSIS — M069 Rheumatoid arthritis, unspecified: Secondary | ICD-10-CM | POA: Diagnosis present

## 2021-11-18 DIAGNOSIS — I10 Essential (primary) hypertension: Secondary | ICD-10-CM | POA: Diagnosis present

## 2021-11-18 DIAGNOSIS — M329 Systemic lupus erythematosus, unspecified: Secondary | ICD-10-CM | POA: Diagnosis present

## 2021-11-18 DIAGNOSIS — E876 Hypokalemia: Secondary | ICD-10-CM | POA: Diagnosis not present

## 2021-11-18 DIAGNOSIS — K449 Diaphragmatic hernia without obstruction or gangrene: Secondary | ICD-10-CM | POA: Diagnosis present

## 2021-11-18 DIAGNOSIS — N189 Chronic kidney disease, unspecified: Secondary | ICD-10-CM | POA: Diagnosis present

## 2021-11-18 DIAGNOSIS — Z6821 Body mass index (BMI) 21.0-21.9, adult: Secondary | ICD-10-CM

## 2021-11-18 DIAGNOSIS — Z7901 Long term (current) use of anticoagulants: Secondary | ICD-10-CM

## 2021-11-18 DIAGNOSIS — K5900 Constipation, unspecified: Secondary | ICD-10-CM

## 2021-11-18 DIAGNOSIS — I5030 Unspecified diastolic (congestive) heart failure: Secondary | ICD-10-CM

## 2021-11-18 DIAGNOSIS — R6881 Early satiety: Secondary | ICD-10-CM | POA: Diagnosis present

## 2021-11-18 DIAGNOSIS — K922 Gastrointestinal hemorrhage, unspecified: Secondary | ICD-10-CM

## 2021-11-18 DIAGNOSIS — F32A Depression, unspecified: Secondary | ICD-10-CM | POA: Diagnosis present

## 2021-11-18 HISTORY — DX: Acute kidney failure, unspecified: N17.9

## 2021-11-18 HISTORY — DX: Unspecified atrial fibrillation: I48.91

## 2021-11-18 HISTORY — DX: Cerebral infarction, unspecified: I63.9

## 2021-11-18 LAB — COMPREHENSIVE METABOLIC PANEL
ALT: 13 U/L (ref 0–44)
AST: 19 U/L (ref 15–41)
Albumin: 2.7 g/dL — ABNORMAL LOW (ref 3.5–5.0)
Alkaline Phosphatase: 70 U/L (ref 38–126)
Anion gap: 10 (ref 5–15)
BUN: 55 mg/dL — ABNORMAL HIGH (ref 8–23)
CO2: 18 mmol/L — ABNORMAL LOW (ref 22–32)
Calcium: 8.5 mg/dL — ABNORMAL LOW (ref 8.9–10.3)
Chloride: 104 mmol/L (ref 98–111)
Creatinine, Ser: 4.33 mg/dL — ABNORMAL HIGH (ref 0.44–1.00)
GFR, Estimated: 10 mL/min — ABNORMAL LOW (ref >60)
Glucose, Bld: 107 mg/dL — ABNORMAL HIGH (ref 70–99)
Potassium: 3.5 mmol/L (ref 3.5–5.1)
Sodium: 132 mmol/L — ABNORMAL LOW (ref 135–145)
Total Bilirubin: 0.5 mg/dL (ref 0.3–1.2)
Total Protein: 7.4 g/dL (ref 6.5–8.1)

## 2021-11-18 LAB — CBC WITH DIFFERENTIAL/PLATELET
Abs Immature Granulocytes: 0.03 10*3/uL (ref 0.00–0.07)
Basophils Absolute: 0 10*3/uL (ref 0.0–0.1)
Basophils Relative: 1 %
Eosinophils Absolute: 0.3 10*3/uL (ref 0.0–0.5)
Eosinophils Relative: 3 %
HCT: 21.2 % — ABNORMAL LOW (ref 36.0–46.0)
Hemoglobin: 7.1 g/dL — ABNORMAL LOW (ref 12.0–15.0)
Immature Granulocytes: 0 %
Lymphocytes Relative: 6 %
Lymphs Abs: 0.4 10*3/uL — ABNORMAL LOW (ref 0.7–4.0)
MCH: 30.1 pg (ref 26.0–34.0)
MCHC: 33.5 g/dL (ref 30.0–36.0)
MCV: 89.8 fL (ref 80.0–100.0)
Monocytes Absolute: 1 10*3/uL (ref 0.1–1.0)
Monocytes Relative: 13 %
Neutro Abs: 6 10*3/uL (ref 1.7–7.7)
Neutrophils Relative %: 77 %
Platelets: 349 10*3/uL (ref 150–400)
RBC: 2.36 MIL/uL — ABNORMAL LOW (ref 3.87–5.11)
RDW: 14.1 % (ref 11.5–15.5)
WBC: 7.8 10*3/uL (ref 4.0–10.5)
nRBC: 0 % (ref 0.0–0.2)

## 2021-11-18 IMAGING — CT CT ABD-PELV W/O CM
2 of 4 series · 15 of 46 positions shown, 17 images · non-contrast
Comparison: [DATE]

CLINICAL DATA: Bowel obstruction, rectal pain, fecal incontinence



[Series 2: axial st · axial · 0.71mm/px · z∈[+730,+1085]mm · 12 of 79 slices shown, 14 images]
[im 4/79  soft-tissue]
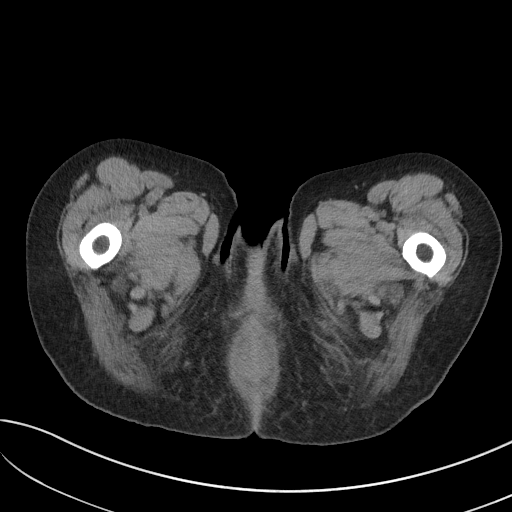
[im 4/79  bone]
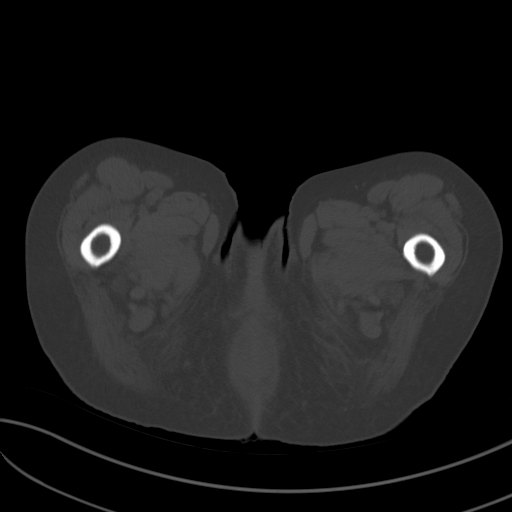
[im 10/79  soft-tissue]
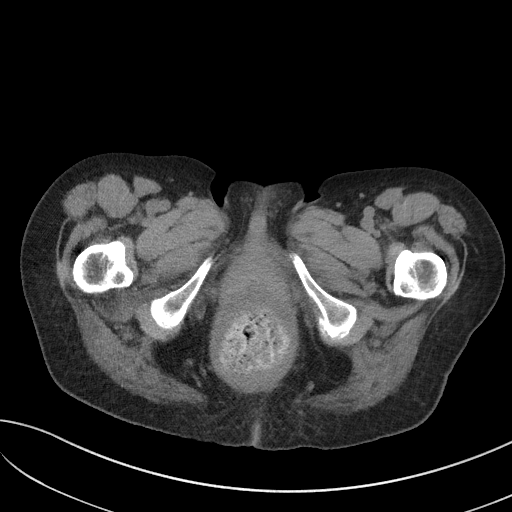
[im 17/79  soft-tissue]
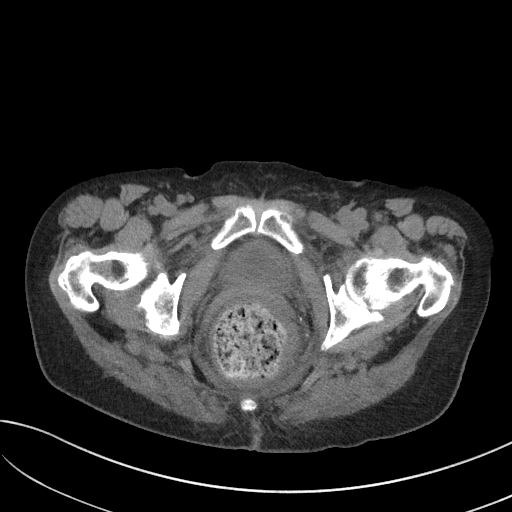
[im 23/79  soft-tissue]
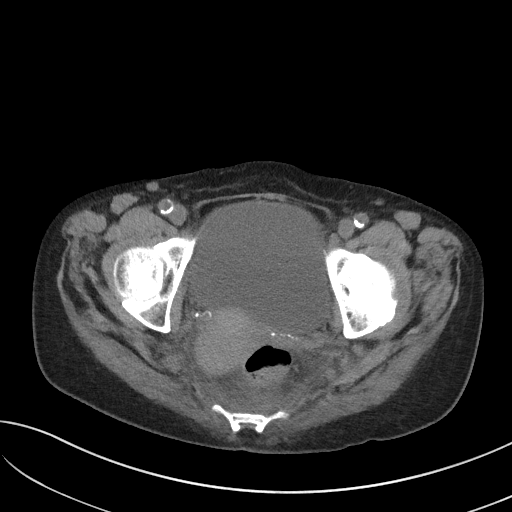
[im 30/79  soft-tissue]
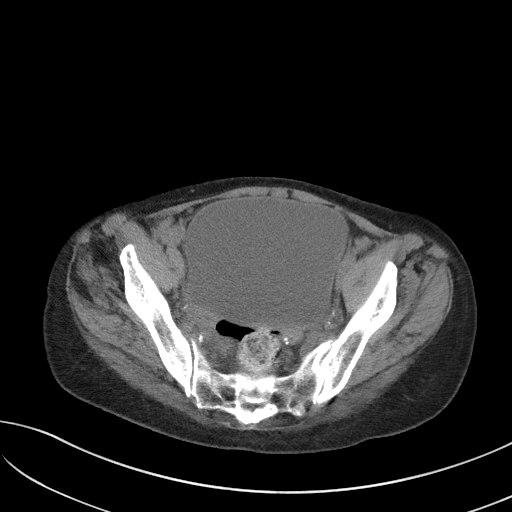
[im 36/79  soft-tissue]
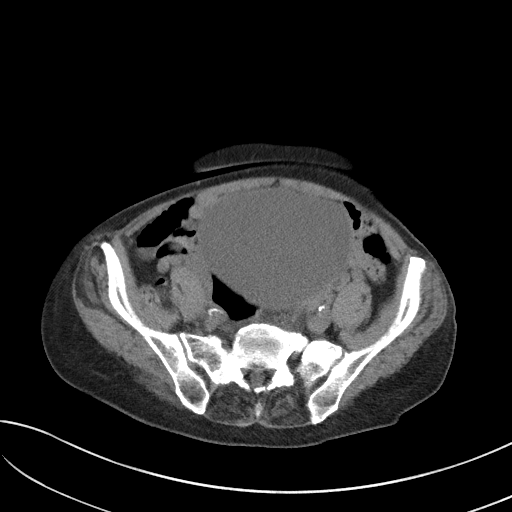
[im 43/79  soft-tissue]
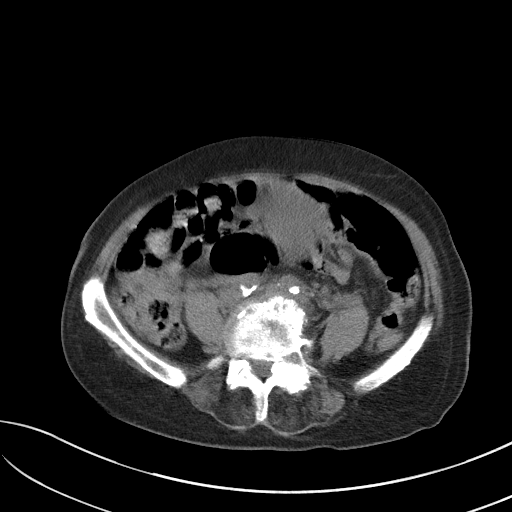
[im 49/79  soft-tissue]
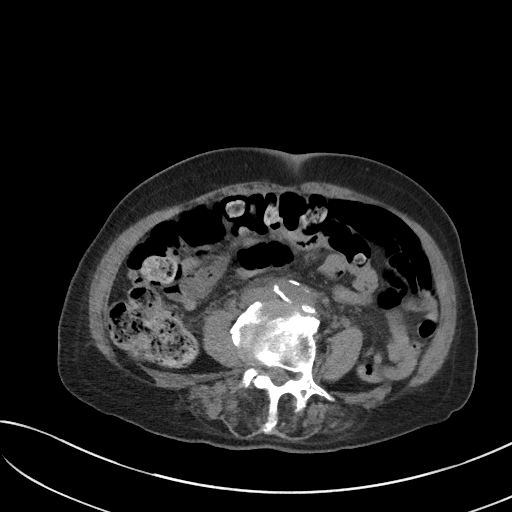
[im 56/79  soft-tissue]
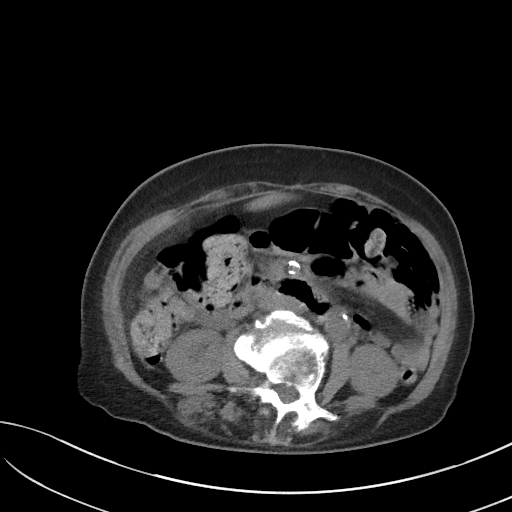
[im 56/79  bone]
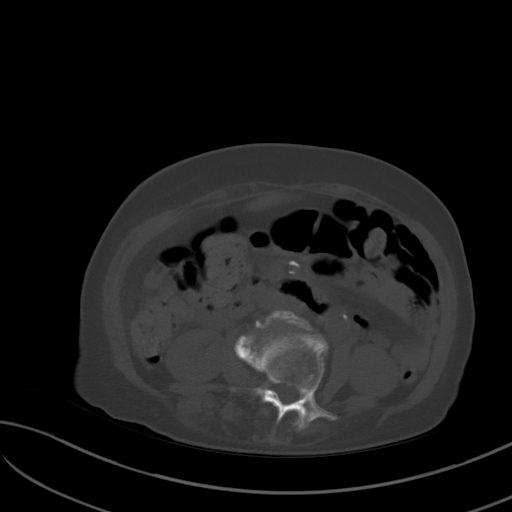
[im 62/79  soft-tissue]
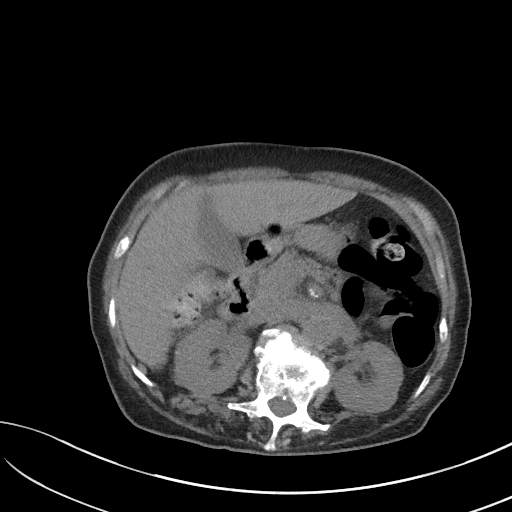
[im 69/79  soft-tissue]
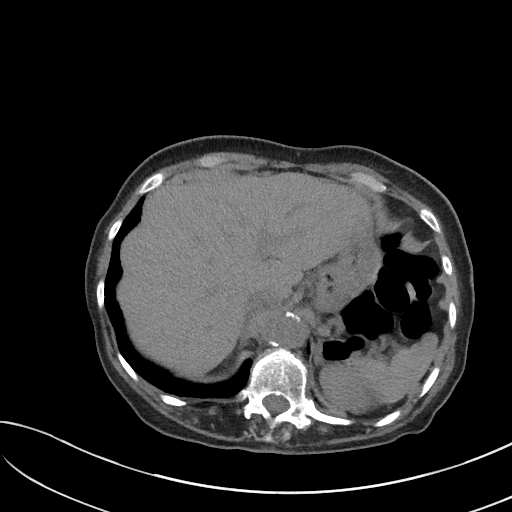
[im 75/79  soft-tissue]
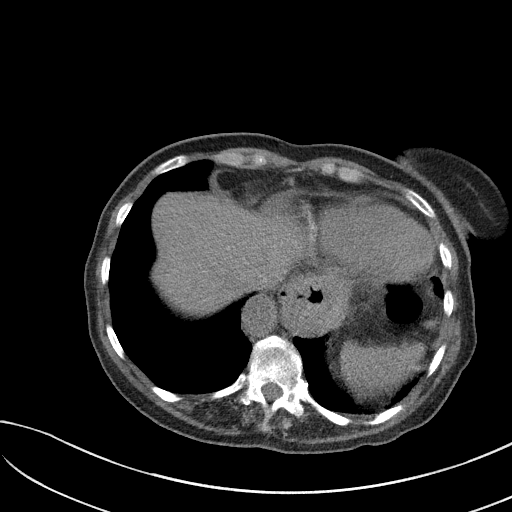

[Series 5: coronal st · coronal · 0.59mm/px · 3 of 99 slices shown]
[im 33/99  soft-tissue]
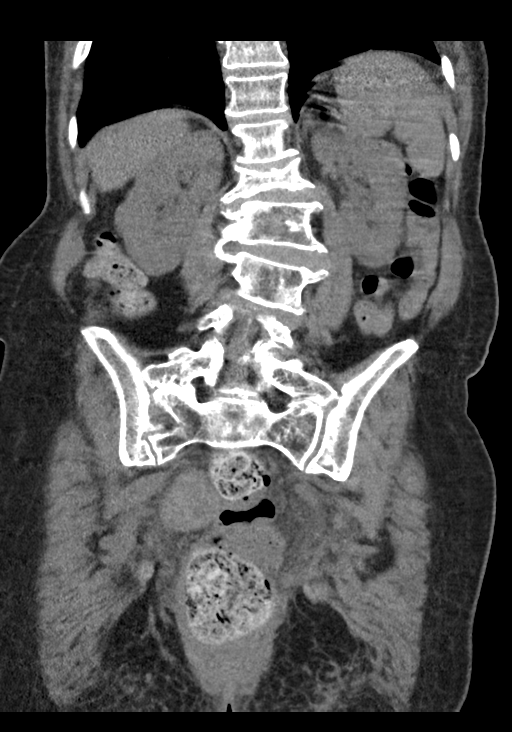
[im 44/99  soft-tissue]
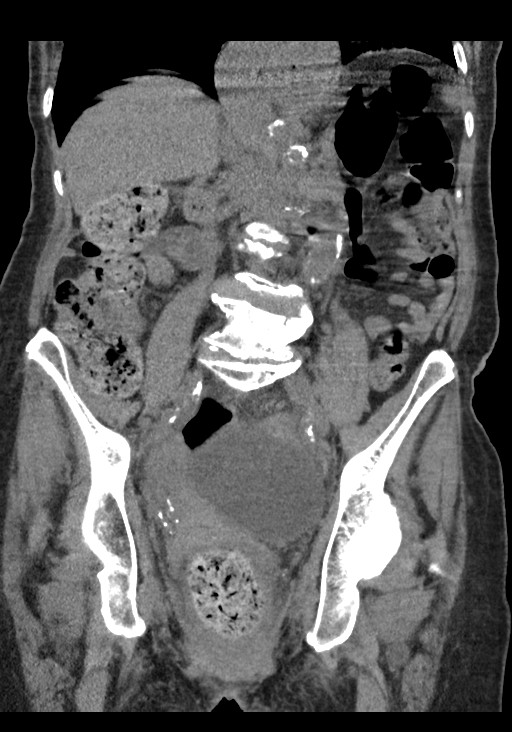
[im 55/99  soft-tissue]
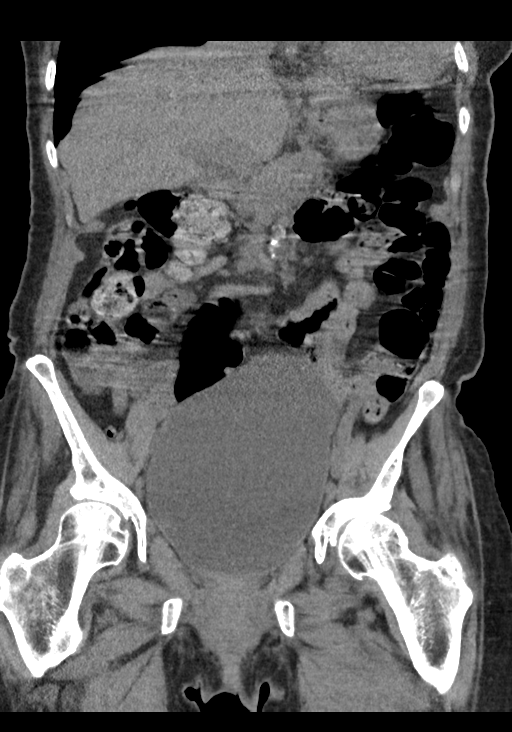

[15 of 46 positions shown; findings below may reference images not displayed]

FINDINGS: Lower chest: The visualized lung bases are clear. Moderate coronary
artery calcification. Moderate hiatal hernia.

Hepatobiliary: No focal liver abnormality is seen. No gallstones,
gallbladder wall thickening, or biliary dilatation.

Pancreas: Unremarkable

Spleen: Unremarkable

Adrenals/Urinary Tract: The adrenal glands are unremarkable. The
kidneys are normal on this noncontrast examination. The bladder is
distended with an estimated bladder volume of approximately 786 cc.
The bladder is otherwise unremarkable.

Stomach/Bowel: Large volume stool is seen within the rectal vault
and there is mild circumferential rectal wall thickening and
perirectal inflammatory stranding suggesting changes of stercoral
proctitis. There is superimposed pelvic floor relaxation with
moderate rectal prolapse descending roughly 5 cm below the
pubococcygeal line. The stomach, small bowel, and large bowel are
otherwise unremarkable. Appendix normal. No free intraperitoneal gas
or fluid.

Vascular/Lymphatic: Aortic atherosclerosis. No enlarged abdominal or
pelvic lymph nodes.

Reproductive: Uterus and bilateral adnexa are unremarkable.

Other: No abdominal wall hernia.

Musculoskeletal: Remote L1 compression deformity with progressive
loss of height noted and resultant focal kyphosis at the
thoracolumbar junction. No retropulsion. Advanced degenerative
changes noted throughout the lumbar spine. Transitional lumbar
anatomy with sacralization of L5 noted. No acute bone abnormality.
IMPRESSION: Large volume stool within the rectal vault with superimposed
inflammatory changes suggesting fecal impaction and/or stercoral
proctitis. No evidence of perforation. Superimposed moderate rectal
prolapse.

Bladder distension within estimated bladder volume of 786 cc. This
may relate to voluntary retention or bladder outlet obstruction.

Progressive loss of height of remote L1 compression deformity now
with resultant focal kyphosis at this level. No retropulsion.

Aortic Atherosclerosis ([FO]-[FO]).

## 2021-11-18 MED ORDER — SODIUM CHLORIDE 0.9 % IV BOLUS
500.0000 mL | Freq: Once | INTRAVENOUS | Status: AC
  Administered 2021-11-18: 500 mL via INTRAVENOUS

## 2021-11-18 NOTE — Plan of Care (Signed)
[facility] will assume care on arrival to accepting facility. Until arrival, care as per EDP. However, TRH available 24/7 for questions and assistance.  Nursing staff, please page TRH Admits and Consults ([phone]) as soon as the patient arrives to the hospital.   

## 2021-11-18 NOTE — ED Triage Notes (Signed)
Pt brought in by daughter with c/o rectal pain and bowel incontinence x 4 days. She reports the patient was constipated last week and was given half of a fleets enema on Sunday; since then has been having rectal pain and leakage of stool. Denies blood in stool and intermittent abd pain. ?

## 2021-11-18 NOTE — ED Notes (Signed)
Patient transported to CT 

## 2021-11-18 NOTE — ED Provider Notes (Signed)
?[place] EMERGENCY DEPARTMENT ?Provider Note ? ? ?CSN: [id number] ?Arrival date & time: 11/18/21  1918 ? ?  ? ?History ? ?Chief Complaint  ?Patient presents with  ? Rectal Pain  ? ? ?Kim [name] is a 76 y.o. female. ? ?Patient is a 76 year old female who presents with constipation and rectal pain.  She has had constipation for the last couple weeks.  She has had some increased constipation since her recent stroke.  She has been using Dulcolax suppositories without improvement in symptoms.  She has now started to have some leakage of watery fluid around her stool.  She has rectal pain.  No bleeding.  She does complain of some abdominal pain.  No nausea or vomiting.  No fevers. ? ? ?  ? ?Home Medications ?Prior to Admission medications   ?Medication Sig Start Date End Date Taking? Authorizing Provider  ?acetaminophen (TYLENOL) 325 MG tablet Take 2 tablets (650 mg total) by mouth every 6 (six) hours as needed for mild pain (or Fever >/= 101). 06/30/21   Terrilee Croak, MD  ?apixaban (ELIQUIS) 5 MG TABS tablet Take 1 tablet (5 mg total) by mouth 2 (two) times daily. 11/09/21   Sherran Needs, NP  ?Capsaicin 0.1 % CREA Apply 1 application topically 2 (two) times daily as needed (tingling  and numbness to plantar foot).    [provider]  ?clotrimazole-betamethasone (LOTRISONE) cream Apply topically as needed. 10/10/21   [provider]  ?famotidine (PEPCID) 20 MG tablet Take 1 tablet by mouth daily. 10/12/21   [provider]  ?hydrALAZINE (APRESOLINE) 50 MG tablet Take 50 mg by mouth in the morning and at bedtime.    [provider]  ?hydroxychloroquine (PLAQUENIL) 200 MG tablet Take 200 mg by mouth daily.    [provider]  ?losartan (COZAAR) 100 MG tablet Take 100 mg by mouth daily. 07/10/21   [provider]  ?magnesium oxide (MAG-OX) 400 MG tablet Take 400 mg by mouth every evening. 07/09/21   [provider]  ?megestrol (MEGACE) 40 MG/ML  suspension Taking 29mls -Only takes 2-3 times daily 10/12/21   [provider]  ?polyethylene glycol (MIRALAX / GLYCOLAX) 17 g packet Take 17 g by mouth daily as needed for mild constipation. 06/30/21   Terrilee Croak, MD  ?rOPINIRole (REQUIP) 1 MG tablet Take 1 mg by mouth at bedtime.    [provider]  ?rosuvastatin (CRESTOR) 40 MG tablet Take 1 tablet (40 mg total) by mouth daily. 08/02/21   Patrecia Pour, MD  ?senna-docusate (SENOKOT-S) 8.6-50 MG tablet Take 1 tablet by mouth every morning. 07/09/21   [provider]  ?traMADol (ULTRAM) 50 MG tablet Take 50 mg by mouth at bedtime. 01/08/20   [provider]  ?Vitamin D, Ergocalciferol, (DRISDOL) 1.25 MG (50000 UNIT) CAPS capsule Take 50,000 Units by mouth every Thursday.    [provider]  ?   ? ?Allergies    ?Beef-derived products, Chicken protein, Fish-derived products, Gabapentin, Labetalol, Lisinopril, Nebivolol, Norvasc [amlodipine], and Pork-derived products   ? ?Review of Systems   ?Review of Systems  ?Constitutional:  Negative for chills, diaphoresis, fatigue and fever.  ?HENT:  Negative for congestion, rhinorrhea and sneezing.   ?Eyes: Negative.   ?Respiratory:  Negative for cough, chest tightness and shortness of breath.   ?Cardiovascular:  Negative for chest pain and leg swelling.  ?Gastrointestinal:  Positive for abdominal pain, constipation and rectal pain. Negative for blood in stool, diarrhea, nausea and vomiting.  ?  Genitourinary:  Negative for difficulty urinating, flank pain, frequency and hematuria.  ?Musculoskeletal:  Negative for arthralgias and back pain.  ?Skin:  Negative for rash.  ?Neurological:  Negative for dizziness, speech difficulty, weakness, numbness and headaches.  ? ?Physical Exam ?Updated Vital Signs ?BP (!) 158/81   Pulse 93   Temp 98.7 ?F (37.1 ?C) (Oral)   Resp 17   Ht 5' (1.524 m)   Wt 50.3 kg   SpO2 96%   BMI 21.68 kg/m?  ?Physical Exam ?Constitutional:   ?   Appearance: She  is well-developed.  ?HENT:  ?   Head: Normocephalic and atraumatic.  ?Eyes:  ?   Pupils: Pupils are equal, round, and reactive to light.  ?Cardiovascular:  ?   Rate and Rhythm: Normal rate and regular rhythm.  ?   Heart sounds: Normal heart sounds.  ?Pulmonary:  ?   Effort: Pulmonary effort is normal. No respiratory distress.  ?   Breath sounds: Normal breath sounds. No wheezing or rales.  ?Chest:  ?   Chest wall: No tenderness.  ?Abdominal:  ?   General: Bowel sounds are normal.  ?   Palpations: Abdomen is soft.  ?   Tenderness: There is abdominal tenderness (Generalized). There is no guarding or rebound.  ?Genitourinary: ?   Comments: Large amount of stool in the rectal vault.  No significant hemorrhoids.  Soft brown stool.  No blood visible. ?Musculoskeletal:     ?   General: Normal range of motion.  ?   Cervical back: Normal range of motion and neck supple.  ?Lymphadenopathy:  ?   Cervical: No cervical adenopathy.  ?Skin: ?   General: Skin is warm and dry.  ?   Findings: No rash.  ?Neurological:  ?   Mental Status: She is alert and oriented to person, place, and time.  ? ? ?ED Results / Procedures / Treatments   ?Labs ?(all labs ordered are listed, but only abnormal results are displayed) ?Labs Reviewed  ?COMPREHENSIVE METABOLIC PANEL - Abnormal; Notable for the following components:  ?    Result Value  ? Sodium 132 (*)   ? CO2 18 (*)   ? Glucose, Bld 107 (*)   ? BUN 55 (*)   ? Creatinine, Ser 4.33 (*)   ? Calcium 8.5 (*)   ? Albumin 2.7 (*)   ? GFR, Estimated 10 (*)   ? All other components within normal limits  ?CBC WITH DIFFERENTIAL/PLATELET - Abnormal; Notable for the following components:  ? RBC 2.36 (*)   ? Hemoglobin 7.1 (*)   ? HCT 21.2 (*)   ? Lymphs Abs 0.4 (*)   ? All other components within normal limits  ?URINALYSIS, ROUTINE W REFLEX MICROSCOPIC  ? ? ?EKG ?None ? ?Radiology ?CT Abdomen Pelvis Wo Contrast ? ?Result Date: 11/18/2021 ?CLINICAL DATA:  Bowel obstruction, rectal pain, fecal incontinence  EXAM: CT ABDOMEN AND PELVIS WITHOUT CONTRAST TECHNIQUE: Multidetector CT imaging of the abdomen and pelvis was performed following the standard protocol without IV contrast. RADIATION DOSE REDUCTION: This exam was performed according to the departmental dose-optimization program which includes automated exposure control, adjustment of the mA and/or kV according to patient size and/or use of iterative reconstruction technique. COMPARISON:  02/08/2019 FINDINGS: Lower chest: The visualized lung bases are clear. Moderate coronary artery calcification. Moderate hiatal hernia. Hepatobiliary: No focal liver abnormality is seen. No gallstones, gallbladder wall thickening, or biliary dilatation. Pancreas: Unremarkable Spleen: Unremarkable Adrenals/Urinary Tract: The adrenal glands are unremarkable. The kidneys  are normal on this noncontrast examination. The bladder is distended with an estimated bladder volume of approximately 786 cc. The bladder is otherwise unremarkable. Stomach/Bowel: Large volume stool is seen within the rectal vault and there is mild circumferential rectal wall thickening and perirectal inflammatory stranding suggesting changes of stercoral proctitis. There is superimposed pelvic floor relaxation with moderate rectal prolapse descending roughly 5 cm below the pubococcygeal line. The stomach, small bowel, and large bowel are otherwise unremarkable. Appendix normal. No free intraperitoneal gas or fluid. Vascular/Lymphatic: Aortic atherosclerosis. No enlarged abdominal or pelvic lymph nodes. Reproductive: Uterus and bilateral adnexa are unremarkable. Other: No abdominal wall hernia. Musculoskeletal: Remote L1 compression deformity with progressive loss of height noted and resultant focal kyphosis at the thoracolumbar junction. No retropulsion. Advanced degenerative changes noted throughout the lumbar spine. Transitional lumbar anatomy with sacralization of L5 noted. No acute bone abnormality. IMPRESSION:  Large volume stool within the rectal vault with superimposed inflammatory changes suggesting fecal impaction and/or stercoral proctitis. No evidence of perforation. Superimposed moderate rectal prolapse. Bladder dis

## 2021-11-19 ENCOUNTER — Encounter (HOSPITAL_COMMUNITY): Payer: Self-pay | Admitting: Internal Medicine

## 2021-11-19 DIAGNOSIS — I5030 Unspecified diastolic (congestive) heart failure: Secondary | ICD-10-CM

## 2021-11-19 DIAGNOSIS — D631 Anemia in chronic kidney disease: Secondary | ICD-10-CM | POA: Diagnosis present

## 2021-11-19 DIAGNOSIS — E871 Hypo-osmolality and hyponatremia: Secondary | ICD-10-CM | POA: Diagnosis present

## 2021-11-19 DIAGNOSIS — J9601 Acute respiratory failure with hypoxia: Secondary | ICD-10-CM | POA: Diagnosis not present

## 2021-11-19 DIAGNOSIS — K2289 Other specified disease of esophagus: Secondary | ICD-10-CM | POA: Diagnosis not present

## 2021-11-19 DIAGNOSIS — Z8673 Personal history of transient ischemic attack (TIA), and cerebral infarction without residual deficits: Secondary | ICD-10-CM

## 2021-11-19 DIAGNOSIS — K911 Postgastric surgery syndromes: Secondary | ICD-10-CM | POA: Diagnosis not present

## 2021-11-19 DIAGNOSIS — I482 Chronic atrial fibrillation, unspecified: Secondary | ICD-10-CM | POA: Diagnosis present

## 2021-11-19 DIAGNOSIS — K626 Ulcer of anus and rectum: Secondary | ICD-10-CM

## 2021-11-19 DIAGNOSIS — K5900 Constipation, unspecified: Secondary | ICD-10-CM

## 2021-11-19 DIAGNOSIS — I10 Essential (primary) hypertension: Secondary | ICD-10-CM | POA: Diagnosis not present

## 2021-11-19 DIAGNOSIS — G2581 Restless legs syndrome: Secondary | ICD-10-CM | POA: Diagnosis present

## 2021-11-19 DIAGNOSIS — R6881 Early satiety: Secondary | ICD-10-CM | POA: Diagnosis not present

## 2021-11-19 DIAGNOSIS — D649 Anemia, unspecified: Secondary | ICD-10-CM

## 2021-11-19 DIAGNOSIS — N170 Acute kidney failure with tubular necrosis: Secondary | ICD-10-CM | POA: Diagnosis present

## 2021-11-19 DIAGNOSIS — F32A Depression, unspecified: Secondary | ICD-10-CM | POA: Diagnosis present

## 2021-11-19 DIAGNOSIS — K625 Hemorrhage of anus and rectum: Secondary | ICD-10-CM | POA: Diagnosis not present

## 2021-11-19 DIAGNOSIS — E874 Mixed disorder of acid-base balance: Secondary | ICD-10-CM | POA: Diagnosis present

## 2021-11-19 DIAGNOSIS — R339 Retention of urine, unspecified: Secondary | ICD-10-CM | POA: Diagnosis present

## 2021-11-19 DIAGNOSIS — E782 Mixed hyperlipidemia: Secondary | ICD-10-CM | POA: Diagnosis present

## 2021-11-19 DIAGNOSIS — E860 Dehydration: Secondary | ICD-10-CM

## 2021-11-19 DIAGNOSIS — N179 Acute kidney failure, unspecified: Secondary | ICD-10-CM | POA: Diagnosis not present

## 2021-11-19 DIAGNOSIS — I480 Paroxysmal atrial fibrillation: Secondary | ICD-10-CM | POA: Diagnosis present

## 2021-11-19 DIAGNOSIS — D62 Acute posthemorrhagic anemia: Secondary | ICD-10-CM | POA: Diagnosis not present

## 2021-11-19 DIAGNOSIS — Q438 Other specified congenital malformations of intestine: Secondary | ICD-10-CM | POA: Diagnosis not present

## 2021-11-19 DIAGNOSIS — K2970 Gastritis, unspecified, without bleeding: Secondary | ICD-10-CM | POA: Diagnosis present

## 2021-11-19 DIAGNOSIS — I5032 Chronic diastolic (congestive) heart failure: Secondary | ICD-10-CM | POA: Diagnosis present

## 2021-11-19 DIAGNOSIS — I130 Hypertensive heart and chronic kidney disease with heart failure and stage 1 through stage 4 chronic kidney disease, or unspecified chronic kidney disease: Secondary | ICD-10-CM | POA: Diagnosis present

## 2021-11-19 DIAGNOSIS — R338 Other retention of urine: Secondary | ICD-10-CM | POA: Diagnosis present

## 2021-11-19 DIAGNOSIS — M329 Systemic lupus erythematosus, unspecified: Secondary | ICD-10-CM | POA: Diagnosis present

## 2021-11-19 DIAGNOSIS — K449 Diaphragmatic hernia without obstruction or gangrene: Secondary | ICD-10-CM | POA: Diagnosis not present

## 2021-11-19 DIAGNOSIS — K922 Gastrointestinal hemorrhage, unspecified: Secondary | ICD-10-CM | POA: Diagnosis not present

## 2021-11-19 DIAGNOSIS — R627 Adult failure to thrive: Secondary | ICD-10-CM | POA: Diagnosis present

## 2021-11-19 DIAGNOSIS — K623 Rectal prolapse: Secondary | ICD-10-CM | POA: Diagnosis present

## 2021-11-19 DIAGNOSIS — N136 Pyonephrosis: Secondary | ICD-10-CM | POA: Diagnosis present

## 2021-11-19 DIAGNOSIS — N189 Chronic kidney disease, unspecified: Secondary | ICD-10-CM | POA: Diagnosis not present

## 2021-11-19 DIAGNOSIS — K5649 Other impaction of intestine: Secondary | ICD-10-CM | POA: Diagnosis present

## 2021-11-19 DIAGNOSIS — K921 Melena: Secondary | ICD-10-CM | POA: Diagnosis present

## 2021-11-19 LAB — CBC WITH DIFFERENTIAL/PLATELET
Abs Immature Granulocytes: 0.03 10*3/uL (ref 0.00–0.07)
Basophils Absolute: 0 10*3/uL (ref 0.0–0.1)
Basophils Relative: 1 %
Eosinophils Absolute: 0.4 10*3/uL (ref 0.0–0.5)
Eosinophils Relative: 6 %
HCT: 19.6 % — ABNORMAL LOW (ref 36.0–46.0)
Hemoglobin: 6.3 g/dL — CL (ref 12.0–15.0)
Immature Granulocytes: 1 %
Lymphocytes Relative: 10 %
Lymphs Abs: 0.6 10*3/uL — ABNORMAL LOW (ref 0.7–4.0)
MCH: 29.9 pg (ref 26.0–34.0)
MCHC: 32.1 g/dL (ref 30.0–36.0)
MCV: 92.9 fL (ref 80.0–100.0)
Monocytes Absolute: 0.8 10*3/uL (ref 0.1–1.0)
Monocytes Relative: 13 %
Neutro Abs: 4.6 10*3/uL (ref 1.7–7.7)
Neutrophils Relative %: 69 %
Platelets: 301 10*3/uL (ref 150–400)
RBC: 2.11 MIL/uL — ABNORMAL LOW (ref 3.87–5.11)
RDW: 14 % (ref 11.5–15.5)
WBC: 6.5 10*3/uL (ref 4.0–10.5)
nRBC: 0 % (ref 0.0–0.2)

## 2021-11-19 LAB — COMPREHENSIVE METABOLIC PANEL
ALT: 12 U/L (ref 0–44)
AST: 19 U/L (ref 15–41)
Albumin: 2.4 g/dL — ABNORMAL LOW (ref 3.5–5.0)
Alkaline Phosphatase: 60 U/L (ref 38–126)
Anion gap: 11 (ref 5–15)
BUN: 52 mg/dL — ABNORMAL HIGH (ref 8–23)
CO2: 15 mmol/L — ABNORMAL LOW (ref 22–32)
Calcium: 8.1 mg/dL — ABNORMAL LOW (ref 8.9–10.3)
Chloride: 105 mmol/L (ref 98–111)
Creatinine, Ser: 4.27 mg/dL — ABNORMAL HIGH (ref 0.44–1.00)
GFR, Estimated: 10 mL/min — ABNORMAL LOW (ref >60)
Glucose, Bld: 168 mg/dL — ABNORMAL HIGH (ref 70–99)
Potassium: 3.3 mmol/L — ABNORMAL LOW (ref 3.5–5.1)
Sodium: 131 mmol/L — ABNORMAL LOW (ref 135–145)
Total Bilirubin: 0.3 mg/dL (ref 0.3–1.2)
Total Protein: 6.1 g/dL — ABNORMAL LOW (ref 6.5–8.1)

## 2021-11-19 LAB — URINALYSIS, ROUTINE W REFLEX MICROSCOPIC
Bilirubin Urine: NEGATIVE
Glucose, UA: 100 mg/dL — AB
Ketones, ur: NEGATIVE mg/dL
Leukocytes,Ua: NEGATIVE
Nitrite: NEGATIVE
Protein, ur: 100 mg/dL — AB
Specific Gravity, Urine: 1.015 (ref 1.005–1.030)
pH: 5.5 (ref 5.0–8.0)

## 2021-11-19 LAB — FOLATE: Folate: 8.8 ng/mL (ref >5.9)

## 2021-11-19 LAB — URINALYSIS, MICROSCOPIC (REFLEX): RBC / HPF: >50 RBC/hpf (ref 0–5)

## 2021-11-19 LAB — IRON AND TIBC
Iron: 31 ug/dL (ref 28–170)
Saturation Ratios: 15 % (ref 10.4–31.8)
TIBC: 203 ug/dL — ABNORMAL LOW (ref 250–450)
UIBC: 172 ug/dL

## 2021-11-19 LAB — PREPARE RBC (CROSSMATCH)

## 2021-11-19 LAB — RETICULOCYTES
Immature Retic Fract: 13 % (ref 2.3–15.9)
RBC.: 2.1 MIL/uL — ABNORMAL LOW (ref 3.87–5.11)
Retic Count, Absolute: 42 10*3/uL (ref 19.0–186.0)
Retic Ct Pct: 2 % (ref 0.4–3.1)

## 2021-11-19 LAB — MAGNESIUM: Magnesium: 1.7 mg/dL (ref 1.7–2.4)

## 2021-11-19 LAB — ABO/RH: ABO/RH(D): O POS

## 2021-11-19 LAB — FERRITIN: Ferritin: 189 ng/mL (ref 11–307)

## 2021-11-19 LAB — TSH: TSH: 2.465 u[IU]/mL (ref 0.350–4.500)

## 2021-11-19 LAB — VITAMIN B12: Vitamin B-12: 766 pg/mL (ref 180–914)

## 2021-11-19 LAB — PHOSPHORUS: Phosphorus: 4.4 mg/dL (ref 2.5–4.6)

## 2021-11-19 MED ORDER — CHLORHEXIDINE GLUCONATE CLOTH 2 % EX PADS: 6.0000 | MEDICATED_PAD | Freq: Every day | CUTANEOUS | Status: DC

## 2021-11-19 MED ORDER — MAGNESIUM OXIDE -MG SUPPLEMENT 400 (240 MG) MG PO TABS
400.0000 mg | ORAL_TABLET | Freq: Every evening | ORAL | Status: DC
Start: 2021-11-19 — End: 2021-11-29
  Administered 2021-11-19 – 2021-11-28 (×9): 400 mg via ORAL
  Filled 2021-11-19 (×10): qty 1

## 2021-11-19 MED ORDER — POLYETHYLENE GLYCOL 3350 17 G PO PACK
17.0000 g | PACK | Freq: Every day | ORAL | Status: DC
  Administered 2021-11-19 – 2021-11-20 (×2): 17 g via ORAL
  Filled 2021-11-19 (×2): qty 1

## 2021-11-19 MED ORDER — ONDANSETRON HCL 4 MG PO TABS: 4.0000 mg | ORAL_TABLET | Freq: Four times a day (QID) | ORAL | Status: DC | PRN

## 2021-11-19 MED ORDER — LACTULOSE 10 GM/15ML PO SOLN
10.0000 g | Freq: Two times a day (BID) | ORAL | Status: DC | PRN
  Filled 2021-11-19: qty 15

## 2021-11-19 MED ORDER — VITAMIN D (ERGOCALCIFEROL) 1.25 MG (50000 UNIT) PO CAPS
50000.0000 [IU] | ORAL_CAPSULE | ORAL | Status: DC
  Administered 2021-11-25: 50000 [IU] via ORAL
  Filled 2021-11-19: qty 1

## 2021-11-19 MED ORDER — BISACODYL 10 MG RE SUPP
10.0000 mg | Freq: Once | RECTAL | Status: AC
Start: 2021-11-19 — End: 2021-11-19
  Administered 2021-11-19: 10 mg via RECTAL
  Filled 2021-11-19: qty 1

## 2021-11-19 MED ORDER — SODIUM CHLORIDE 0.9% FLUSH
3.0000 mL | Freq: Two times a day (BID) | INTRAVENOUS | Status: DC
  Administered 2021-11-20 – 2021-11-29 (×17): 3 mL via INTRAVENOUS

## 2021-11-19 MED ORDER — LACTULOSE 10 GM/15ML PO SOLN
20.0000 g | Freq: Once | ORAL | Status: AC
  Administered 2021-11-19: 20 g via ORAL
  Filled 2021-11-19: qty 30

## 2021-11-19 MED ORDER — ENSURE ENLIVE PO LIQD
237.0000 mL | Freq: Three times a day (TID) | ORAL | Status: DC
  Administered 2021-11-19 – 2021-11-29 (×16): 237 mL via ORAL

## 2021-11-19 MED ORDER — SODIUM CHLORIDE 0.9% IV SOLUTION: Freq: Once | INTRAVENOUS | Status: DC

## 2021-11-19 MED ORDER — ROPINIROLE HCL 1 MG PO TABS
1.0000 mg | ORAL_TABLET | Freq: Every day | ORAL | Status: DC
  Administered 2021-11-19 – 2021-11-28 (×10): 1 mg via ORAL
  Filled 2021-11-19 (×10): qty 1

## 2021-11-19 MED ORDER — HYDROXYCHLOROQUINE SULFATE 200 MG PO TABS
200.0000 mg | ORAL_TABLET | Freq: Every day | ORAL | Status: DC
  Administered 2021-11-19 – 2021-11-29 (×11): 200 mg via ORAL
  Filled 2021-11-19 (×11): qty 1

## 2021-11-19 MED ORDER — ADULT MULTIVITAMIN W/MINERALS CH
1.0000 | ORAL_TABLET | Freq: Every day | ORAL | Status: DC
  Administered 2021-11-19 – 2021-11-29 (×11): 1 via ORAL
  Filled 2021-11-19 (×11): qty 1

## 2021-11-19 MED ORDER — CHLORHEXIDINE GLUCONATE CLOTH 2 % EX PADS
6.0000 | MEDICATED_PAD | Freq: Every day | CUTANEOUS | Status: DC
Start: 2021-11-20 — End: 2021-11-29
  Administered 2021-11-20 – 2021-11-27 (×5): 6 via TOPICAL

## 2021-11-19 MED ORDER — ACETAMINOPHEN 325 MG PO TABS
650.0000 mg | ORAL_TABLET | Freq: Four times a day (QID) | ORAL | Status: DC | PRN
  Administered 2021-11-19 – 2021-11-21 (×2): 650 mg via ORAL
  Filled 2021-11-19 (×2): qty 2

## 2021-11-19 MED ORDER — LACTATED RINGERS IV SOLN: INTRAVENOUS | Status: DC

## 2021-11-19 MED ORDER — LACTULOSE 10 GM/15ML PO SOLN: 20.0000 g | Freq: Three times a day (TID) | ORAL | Status: DC

## 2021-11-19 MED ORDER — ACETAMINOPHEN 650 MG RE SUPP: 650.0000 mg | Freq: Four times a day (QID) | RECTAL | Status: DC | PRN

## 2021-11-19 MED ORDER — ONDANSETRON HCL 4 MG/2ML IJ SOLN
4.0000 mg | Freq: Four times a day (QID) | INTRAMUSCULAR | Status: DC | PRN
Start: 2021-11-19 — End: 2021-11-29
  Administered 2021-11-23 – 2021-11-24 (×4): 4 mg via INTRAVENOUS
  Filled 2021-11-19 (×4): qty 2

## 2021-11-19 MED ORDER — POLYETHYLENE GLYCOL 3350 17 GM/SCOOP PO POWD
0.5000 | Freq: Once | ORAL | Status: AC
  Administered 2021-11-19: 127.5 g via ORAL
  Filled 2021-11-19: qty 255

## 2021-11-19 MED ORDER — ROSUVASTATIN CALCIUM 20 MG PO TABS
40.0000 mg | ORAL_TABLET | Freq: Every day | ORAL | Status: DC
  Administered 2021-11-19 – 2021-11-29 (×11): 40 mg via ORAL
  Filled 2021-11-19 (×11): qty 2

## 2021-11-19 MED ORDER — FAMOTIDINE 20 MG PO TABS
20.0000 mg | ORAL_TABLET | Freq: Every day | ORAL | Status: DC
  Administered 2021-11-19 – 2021-11-29 (×11): 20 mg via ORAL
  Filled 2021-11-19 (×11): qty 1

## 2021-11-19 MED ORDER — DOCUSATE SODIUM 100 MG PO CAPS
100.0000 mg | ORAL_CAPSULE | Freq: Two times a day (BID) | ORAL | Status: DC
Start: 2021-11-19 — End: 2021-11-29
  Administered 2021-11-19 – 2021-11-29 (×21): 100 mg via ORAL
  Filled 2021-11-19 (×21): qty 1

## 2021-11-19 NOTE — Progress Notes (Signed)
Orthostatic BP readings: ? ?Sitting 145/76, MAP 93,P 68.  ?standing 146/77, MAP 99, P  77 ? ?3 mins later: 145/74, MAP 95, P 77 ?

## 2021-11-19 NOTE — Progress Notes (Signed)
Hgb has dropped to 6.3. Iron 31 and other anemia indices essentially unremarkable. Nursing reported some BRBPR evidenced by tiny clots after BM. Dtr Manpreet updated and OK with blood products. Dr Presley Raddle GI called for formal consult- given Dr Retia Passe contact info. ?

## 2021-11-19 NOTE — Progress Notes (Signed)
Initial Nutrition Assessment ? ?DOCUMENTATION CODES:  ? ?Not applicable ? ?INTERVENTION:  ? ?Continue Ensure Enlive po TID, each supplement provides 350 kcal and 20 grams of protein. ? ?Pt is vegetarain; cater to pt preferences ? ?Add MVI with Minerals daily ? ? ?NUTRITION DIAGNOSIS:  ? ?Inadequate oral intake related to poor appetite as evidenced by per patient/family report. ? ?GOAL:  ? ?Patient will meet greater than or equal to 90% of their needs ? ?MONITOR:  ? ?PO intake, Supplement acceptance, Diet advancement, Labs, Weight trends ? ?REASON FOR ASSESSMENT:  ? ?Malnutrition Screening Tool, Consult ?Assessment of nutrition requirement/status ? ?ASSESSMENT:  ? ?76 yo female admitted with AKI, rectal impaction with stercoral proctitis/focal constipation, new Afib, FTT. PMH includes SLE with RA, recent CVA, HTN, CHF with preserved EF ? ?Currently on CL diet, no recorded po intake. Noted pt is Vegetarian, added to patient order ? ?Noted pt partially disimpacted in ED, some bleeding noted. Hgb dropped to 6.3, GI consult pending.  ? ?Noted pt has been on Megace in the past but was discontinued as did not work.  ? ?Current wt 50.7 kg; based on weight encounters, pt weighed 53 kg in Feb 2023. 5% wt loss in 3 months ? ?Labs: sodium 131 (L), potassium 3.3 (L), Hgb 6.3 ?Meds: Vit D, colace, mag ox, miralax ? ? ?NUTRITION - FOCUSED PHYSICAL EXAM: ? ?Unable to assess ? ?Diet Order:   ?Diet Order   ? ?       ?  Diet clear liquid Room service appropriate? Yes; Fluid consistency: Thin  Diet effective now       ?  ? ?  ?  ? ?  ? ? ?EDUCATION NEEDS:  ? ?Not appropriate for education at this time ? ?Skin:  Skin Assessment: Reviewed RN Assessment ? ?Last BM:  5/12 ? ?Height:  ? ?Ht Readings from Last 1 Encounters:  ?11/19/21 5' (1.524 m)  ? ? ?Weight:  ? ?Wt Readings from Last 1 Encounters:  ?11/19/21 50.7 kg  ? ? ?BMI:  Body mass index is 21.83 kg/m?. ? ?Estimated Nutritional Needs:  ? ?Kcal:  1550-1750 kcals ? ?Protein:  75-85  g ? ?Fluid:  >/= 1.5 L ? ? ?Kerman Passey MS, RDN, LDN, CNSC ?Registered Dietitian III ?Clinical Nutrition ?RD Pager and On-Call Pager Number Located in [place]  ? ?

## 2021-11-19 NOTE — Consult Note (Addendum)
? ?                                            Consultation Note ? ? ?Referring Provider: [facility] ?PCP: Kim Lien, PA-C ?Primary Gastroenterologist: Kim [name] ?Reason for consultation: rectal bleeding  ?Hospital Day: 2 ? ?Assessment  ? ? ?76 yo female with Fecal impaction / rectal bleeding. 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  ?CT scan shows large volume of rectal stool and rectal wall thickening. She was since disimpacted and had a few small BMs today per RN. Scant rectal bleeding with tiny clots today. . On DRE there is no stool or blood in vault but could still be impacted higher up or maybe stool ball has passed.  ? ?Acute on chronic anemia. Baseline hgb 10-11, currently at 6.3 in setting of rectal bleeding ? ?Recently diagnosed A-FIB.  ?Home Eliquis on hold due to rectal bleeding ? ?AKI ?Getting IV fluids. Cozaar and hydralazine on hold ?  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 ?See PMH for additional medical problems ? ? ?Plan  ? ?Slow bowel purge from above with 1/2 of a Miralax prep today. Will see again tomorrow. Depending on how things go we may want to pursue lower endoscopy (flex sig vs. Colonoscopy)  ?She will need a more aggressive bowel regimen at home. Spoke with daughter about adding glycerin suppositories at home. Can even use after a BM to help ensure more complete rectal emptying.   ? ?History of Present Illness:  ?Kim [name] is a 76 y.o. female with a past medical history significant for  HTN, HLD, SLE, rheumatoid arthritis, CVA, CHF, Afib ( recent), H.pylori PUD, Barrett's esophagus.  See PMH for any additional medical problems. ? ?Patient doesn't speak [place]. Her daughter, Kim [name] ( a Publishing rights manager) helped translate.  ?  ?Patient has had intermittent constipation over the last several months ( since her CVA). She has been alternating colace and Senokot daily and using MIralax as needed. Patient has expressed to family that she has a BM every day but it sounds low  volume. Last week she took an enema but no significant stool output. Sounds like she has been leaking a small amount of liquid stool around impaction.  ? ?She presented to ED yesterday for evaluation of rectal pain and fecal incontinence. She had AKI with Cr of 4.33. Hgb 7, down from baseline of 10-11.  Non-contrast CT scan remarkable for a large amount of stool in rectal vault and mild circumferential rectal wall thickening and perirectal inflammatory stranding suggesting changes of stercoral proctitis. ? ?Apparently in the ED they were able to manually remove some stool but also  extracted some blood. Today she has passes a few small pieces of stool and tiny clots of blood.  ? ?Per daughter patient has a history of H.pylori PUD and Barrett's esophagus diagnosed in [place] 5+ years ago. She had a colonoscopy around the same time and daughter believes it was normal. She complains of acid reflux symptoms sometimes but doesn't take an acid blocker on a regular basis.  ? ?Ms Kim [name] has had intermittent nausea/ vomiting for about a month. Her weight is down about 6 [name] since December.  ? ?Ferritin 189, low TIBC, normal iron saturation. WBC normal.  ? ?Ms Kim [name] had a CVA in December and ? One in January 2023. She was started on plavix  and ASA. Then last week was diagnosed with Afib. Plavix stopped and she was started on Eliquis.  ? ?Previous GI Evaluation / History   ? ?Recent Labs and Imaging ?CT Abdomen Pelvis Wo Contrast ? ?Result Date: 11/18/2021 ?CLINICAL DATA:  Bowel obstruction, rectal pain, fecal incontinence EXAM: CT ABDOMEN AND PELVIS WITHOUT CONTRAST TECHNIQUE: Multidetector CT imaging of the abdomen and pelvis was performed following the standard protocol without IV contrast. RADIATION DOSE REDUCTION: This exam was performed according to the departmental dose-optimization program which includes automated exposure control, adjustment of the mA and/or kV according to patient size and/or use of iterative  reconstruction technique. COMPARISON:  02/08/2019 FINDINGS: Lower chest: The visualized lung bases are clear. Moderate coronary artery calcification. Moderate hiatal hernia. Hepatobiliary: No focal liver abnormality is seen. No gallstones, gallbladder wall thickening, or biliary dilatation. Pancreas: Unremarkable Spleen: Unremarkable Adrenals/Urinary Tract: The adrenal glands are unremarkable. The kidneys are normal on this noncontrast examination. The bladder is distended with an estimated bladder volume of approximately 786 cc. The bladder is otherwise unremarkable. Stomach/Bowel: Large volume stool is seen within the rectal vault and there is mild circumferential rectal wall thickening and perirectal inflammatory stranding suggesting changes of stercoral proctitis. There is superimposed pelvic floor relaxation with moderate rectal prolapse descending roughly 5 cm below the pubococcygeal line. The stomach, small bowel, and large bowel are otherwise unremarkable. Appendix normal. No free intraperitoneal gas or fluid. Vascular/Lymphatic: Aortic atherosclerosis. No enlarged abdominal or pelvic lymph nodes. Reproductive: Uterus and bilateral adnexa are unremarkable. Other: No abdominal wall hernia. Musculoskeletal: Remote L1 compression deformity with progressive loss of height noted and resultant focal kyphosis at the thoracolumbar junction. No retropulsion. Advanced degenerative changes noted throughout the lumbar spine. Transitional lumbar anatomy with sacralization of L5 noted. No acute bone abnormality. IMPRESSION: Large volume stool within the rectal vault with superimposed inflammatory changes suggesting fecal impaction and/or stercoral proctitis. No evidence of perforation. Superimposed moderate rectal prolapse. Bladder distension within estimated bladder volume of 786 cc. This may relate to voluntary retention or bladder outlet obstruction. Progressive loss of height of remote L1 compression deformity now  with resultant focal kyphosis at this level. No retropulsion. Aortic Atherosclerosis (ICD10-I70.0). Electronically Signed   By: Helyn Numbers M.D.   On: 11/18/2021 20:20  ? ?CUP PACEART REMOTE DEVICE CHECK ? ?Result Date: 11/12/2021 ?ILR summary report received. Battery status OK. Normal device function. No new symptom, tachy, brady, or pause episodes. 50 new AF episodes, longest duration , HR's 97-140.  Burden 2.1%, Eliquis.  Monthly summary reports and ROV/PRN LA  ? ?Labs:  ?Recent Labs  ?  11/18/21 ?2026 11/19/21 ?0926  ?WBC 7.8 6.5  ?HGB 7.1* 6.3*  ?HCT 21.2* 19.6*  ?PLT 349 301  ? ?Recent Labs  ?  11/18/21 ?2026 11/19/21 ?0926  ?NA 132* 131*  ?K 3.5 3.3*  ?CL 104 105  ?CO2 18* 15*  ?GLUCOSE 107* 168*  ?BUN 55* 52*  ?CREATININE 4.33* 4.27*  ?CALCIUM 8.5* 8.1*  ? ?Recent Labs  ?  11/19/21 ?0926  ?PROT 6.1*  ?ALBUMIN 2.4*  ?AST 19  ?ALT 12  ?ALKPHOS 60  ?BILITOT 0.3  ? ?No results for input(s): HEPBSAG, HCVAB, HEPAIGM, HEPBIGM in the last 72 hours. ?No results for input(s): LABPROT, INR in the last 72 hours. ? ?Past Medical History:  ?Diagnosis Date  ? AKI (acute kidney injury) (HCC) 11/18/2021  ? Arthritis   ? Atrial fibrillation (HCC)   ? Depression   ? Essential hypertension 06/28/2021  ? Mixed hyperlipidemia  06/28/2021  ? Restless leg syndrome 06/28/2021  ? Stroke [facility])   ? x2  ? ? ?Past Surgical History:  ?Procedure Laterality Date  ? BUBBLE STUDY  08/02/2021  ? Procedure: BUBBLE STUDY;  Surgeon: Jake Bathe, MD;  Location: [facility] ENDOSCOPY;  Service: Cardiovascular;;  ? LOOP RECORDER INSERTION N/A 08/02/2021  ? Procedure: LOOP RECORDER INSERTION;  Surgeon: Lanier Prude, MD;  Location: [facility] INVASIVE CV LAB;  Service: Cardiovascular;  Laterality: N/A;  ? TEE WITHOUT CARDIOVERSION N/A 08/02/2021  ? Procedure: TRANSESOPHAGEAL ECHOCARDIOGRAM (TEE);  Surgeon: Jake Bathe, MD;  Location: [facility] ENDOSCOPY;  Service: Cardiovascular;  Laterality: N/A;  ? ? ?Family History  ?Problem Relation Age of Onset  ? Heart  disease Neg Hx   ? ? ?Prior to Admission medications   ?Medication Sig Start Date End Date Taking? Authorizing Provider  ?apixaban (ELIQUIS) 5 MG TABS tablet Take 1 tablet (5 mg total) by mouth 2 (two) times d

## 2021-11-19 NOTE — H&P (Addendum)
?History and Physical  ? ? ?Patient: Kim [name] [id number] DOB: [birth year] ?DOA: 11/18/2021 ?DOS: the patient was seen and examined on 11/19/2021 ?PCP: Curt Jews, PA-C  ?Patient coming from: Home ? ?Chief Complaint:  ?Chief Complaint  ?Patient presents with  ? Rectal Pain  ? ?HPI: Kim [name] is a 76 y.o. female with medical history significant of SLE with rheumatoid arthritis, recent left cerebral artery embolic CVA hospitalized both in December 2022 and once again in January 2023, hypertension, heart failure with preserved EF/diastolic dysfunction noted on echocardiogram December 2022 and dyslipidemia.  Patient presented to the ER with complaints of constipation and rectal pain.  This has been ongoing since her recent stroke.  Has been using Dulcolax suppositories without improvement in symptoms.  Now having leakage of watery stool.  No bleeding noted.  Had implantable cardiac monitor in place due to recent stroke and was recently diagnosed with atrial fibrillation and started on Eliquis neurologist on 5/2.  Because of this discontinued but aspirin was recommended to remain in place.  Labs obtained in the ER revealed acute kidney injury with a BUN of 55 and a creatinine of 4.33 with low sodium of 132 and CO2 of 18 consistent with contraction alkalosis/dehydration.  Baseline renal function 09/17/2021 was BUN of 14 with a creatinine of 0.70.  Rested CT in the ER revealed large volume of stool within the rectal vault with superimposed inflammatory changes suggesting fecal impaction with associated stercoral proctitis and a moderate rectal prolapse.  She was also noted with bladder distention with a volume of 786 cc so a Foley catheter was placed.  She was given 500 cc fluid bolus and hospitalist were asked to admit patient. ? ?In further discussion with daughter at the bedside who is translating for the patient patient has had poor oral intake since her stroke.  Daughter is a Designer, jewellery and notes  that the patient has not had any issues with choking or coughing while eating and does not appear to have any dysphagia and poor oral intake seems to be solely related to depression symptoms.  Family has been encouraging patient to eat but she has not.  This is worsened over the past several days.  States no issues with falls, gait disturbance and previous extremity weakness noted during initial stroke has now resolved. ? ? ?Review of Systems:  ?As mentioned in the history of present illness. All other systems reviewed and are negative.  History obtained from the chart as well as with assistance of family at bedside since patient is not primary [place] speaking.  Her primary language is Punjabi ? ?Past Medical History:  ?Diagnosis Date  ? AKI (acute kidney injury) ([place]) 11/18/2021  ? Arthritis   ? Atrial fibrillation ([place])   ? Depression   ? Essential hypertension 06/28/2021  ? Mixed hyperlipidemia 06/28/2021  ? Restless leg syndrome 06/28/2021  ? Stroke [facility])   ? x2  ? ?Past Surgical History:  ?Procedure Laterality Date  ? BUBBLE STUDY  08/02/2021  ? Procedure: BUBBLE STUDY;  Surgeon: Jerline Pain, MD;  Location: [facility] ENDOSCOPY;  Service: Cardiovascular;;  ? LOOP RECORDER INSERTION N/A 08/02/2021  ? Procedure: LOOP RECORDER INSERTION;  Surgeon: Vickie Epley, MD;  Location: [facility] CV LAB;  Service: Cardiovascular;  Laterality: N/A;  ? TEE WITHOUT CARDIOVERSION N/A 08/02/2021  ? Procedure: TRANSESOPHAGEAL ECHOCARDIOGRAM (TEE);  Surgeon: Jerline Pain, MD;  Location: [facility] ENDOSCOPY;  Service: Cardiovascular;  Laterality: N/A;  ? ?Social History:  reports that  she has never smoked. She has never used smokeless tobacco. She reports that she does not drink alcohol and does not use drugs. ? ?Allergies  ?Allergen Reactions  ? Beef-Derived Products   ? Chicken Protein   ?  vegetarian  ? Fish-Derived Products   ?  vegetarian  ? Gabapentin Other (See Comments)  ?  unknown ?unknown ?  ? Labetalol Other (See Comments)  ?   Bradycardia  ? Lisinopril Other (See Comments)  ? Nebivolol Other (See Comments)  ?  Bradycardia,  ? Norvasc [Amlodipine] Swelling  ? Pork-Derived Products   ?  Vegetarian (pt accepts heparin products 07/31/21)  ? ? ?Family History  ?Problem Relation Age of Onset  ? Heart disease Neg Hx   ? ? ?Prior to Admission medications   ?Medication Sig Start Date End Date Taking? Authorizing Provider  ?acetaminophen (TYLENOL) 325 MG tablet Take 2 tablets (650 mg total) by mouth every 6 (six) hours as needed for mild pain (or Fever >/= 101). 06/30/21   Terrilee Croak, MD  ?apixaban (ELIQUIS) 5 MG TABS tablet Take 1 tablet (5 mg total) by mouth 2 (two) times daily. 11/09/21   Sherran Needs, NP  ?Capsaicin 0.1 % CREA Apply 1 application topically 2 (two) times daily as needed (tingling  and numbness to plantar foot).    [provider]  ?clotrimazole-betamethasone (LOTRISONE) cream Apply topically as needed. 10/10/21   [provider]  ?famotidine (PEPCID) 20 MG tablet Take 1 tablet by mouth daily. 10/12/21   [provider]  ?hydrALAZINE (APRESOLINE) 50 MG tablet Take 50 mg by mouth in the morning and at bedtime.    [provider]  ?hydroxychloroquine (PLAQUENIL) 200 MG tablet Take 200 mg by mouth daily.    [provider]  ?losartan (COZAAR) 100 MG tablet Take 100 mg by mouth daily. 07/10/21   [provider]  ?magnesium oxide (MAG-OX) 400 MG tablet Take 400 mg by mouth every evening. 07/09/21   [provider]  ?megestrol (MEGACE) 40 MG/ML suspension Taking 76mls -Only takes 2-3 times daily 10/12/21   [provider]  ?polyethylene glycol (MIRALAX / GLYCOLAX) 17 g packet Take 17 g by mouth daily as needed for mild constipation. 06/30/21   Terrilee Croak, MD  ?rOPINIRole (REQUIP) 1 MG tablet Take 1 mg by mouth at bedtime.    [provider]  ?rosuvastatin (CRESTOR) 40 MG tablet Take 1 tablet (40 mg total) by mouth daily. 08/02/21   Patrecia Pour, MD   ?senna-docusate (SENOKOT-S) 8.6-50 MG tablet Take 1 tablet by mouth every morning. 07/09/21   [provider]  ?traMADol (ULTRAM) 50 MG tablet Take 50 mg by mouth at bedtime. 01/08/20   [provider]  ?Vitamin D, Ergocalciferol, (DRISDOL) 1.25 MG (50000 UNIT) CAPS capsule Take 50,000 Units by mouth every Thursday.    [provider]  ? ? ?Physical Exam: ?Vitals:  ? 11/18/21 2224 11/19/21 0100 11/19/21 0400 11/19/21 0459  ?BP: (!) 158/81 109/68 117/65 138/85  ?Pulse: 93 90 91 60  ?Resp: 17 18 18 18   ?Temp: 98.7 ?F (37.1 ?C) 98.7 ?F (37.1 ?C) 98.5 ?F (36.9 ?C) 98.4 ?F (36.9 ?C)  ?TempSrc: Oral  Oral Oral  ?SpO2: 96% 97% 97% 97%  ?Weight:    50.7 kg  ?Height:    5' (1.524 m)  ? ?Constitutional: NAD, calm, comfortable ?Eyes: PERRL, lids and conjunctivae normal ?ENMT: Mucous membranes are dry.  Posterior pharynx clear of any exudate or lesions.Normal dentition.  ?  Neck: normal, supple, no masses, no thyromegaly ?Respiratory: clear to auscultation bilaterally, no wheezing, no crackles. Normal respiratory effort. No accessory muscle use.  Room air ?Cardiovascular: Irregular rate and rhythm, S4. No extremity edema. 2+ pedal pulses. No carotid bruits.  ?Abdomen: no tenderness, no masses palpated. No hepatosplenomegaly. Bowel sounds positive.  ?Musculoskeletal: no clubbing / cyanosis. No joint deformity upper and lower extremities. Good ROM, no contractures. Normal muscle tone.  ?Skin: no rashes, lesions, ulcers. No induration ?Neurologic: CN 2-12 grossly intact. Sensation intact, DTR normal. Strength 4/5 x all 4 extremities.  ?Psychiatric: Normal judgment and insight. Alert and oriented x 3.  Based on assistance with daughter who is translating.  Seems to have somewhat bland flat affect but did smile briefly during conversation.  Although I think she understands some English it is best to use a translator to make sure all information is communicated effectively. ? ?Data Reviewed: ? ?  As noted in  the HPI ? ?Assessment and Plan: ? ?Acute kidney injury with associated contraction alkalosis and hyponatremia ?Current creatinine 4.33 with baseline 0.70-preadmission ARB as well as acute urinary retention co

## 2021-11-19 NOTE — TOC Progression Note (Signed)
Transition of [name] (TOC) - Initial/Assessment Note  ? ? ?Patient Details  ?Name: Kim [name] ?MRN: [id number] ?Date of Birth: 1946-03-09 ? ?Transition of [name] (TOC) CM/SW Contact:    ?Paulene Floor Higinio Grow, LCSWA ?Phone Number: ?11/19/2021, 2:12 PM ? ?Clinical Narrative:                 ? ?Transition of [name] Department [facility]) has reviewed patient and no TOC needs have been identified at this time. We will continue to monitor patient advancement through interdisciplinary progression rounds. If new patient transition needs arise, please place a TOC consult. ?  ? ?  ?  ? ? ?Patient Goals and CMS Choice ?  ?  ?  ? ?Expected Discharge Plan and Services ?  ?  ?  ?  ?  ?                ?  ?  ?  ?  ?  ?  ?  ?  ?  ?  ? ?Prior Living Arrangements/Services ?  ?  ?  ?       ?  ?  ?  ?  ? ?Activities of Daily Living ?Home Assistive Devices/Equipment: Dentures (specify type), Shower chair with back, Walker (specify type), Cane (specify quad or straight) ?ADL Screening (condition at time of admission) ?Patient's cognitive ability adequate to safely complete daily activities?: No ?Is the patient deaf or have difficulty hearing?: No ?Does the patient have difficulty concentrating, remembering, or making decisions?: No ?Patient able to express need for assistance with ADLs?: Yes ?Does the patient have difficulty dressing or bathing?: Yes ?Independently performs ADLs?: No ?Communication: Independent ?Dressing (OT): Needs assistance ?Grooming: Needs assistance ?Feeding: Needs assistance ?Bathing: Needs assistance ?Toileting: Needs assistance ?In/Out Bed: Needs assistance ?Walks in Home: Independent with device (comment) (walker) ?Does the patient have difficulty walking or climbing stairs?: No ?Weakness of Legs: Both ?Weakness of Arms/Hands: Both ? ?Permission Sought/Granted ?  ?  ?   ?   ?   ?   ? ?Emotional Assessment ?  ?  ?  ?  ?  ?  ? ?Admission diagnosis:  Urinary retention [R33.9] ?Fecal impaction ([place]) [K56.41] ?AKI (acute kidney  injury) ([place]) [N17.9] ?Acute renal failure, unspecified acute renal failure type ([place]) [N17.9] ?Gastrointestinal hemorrhage, unspecified gastrointestinal hemorrhage type [K92.2] ?Anemia, unspecified type [D64.9] ?Acute kidney injury ([place]) [N17.9] ?Patient Active Problem List  ? Diagnosis Date Noted  ? (HFpEF) heart failure with preserved ejection fraction ([place]) 11/19/2021  ? Anemia 11/19/2021  ? Constipation 11/19/2021  ? 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 Dehydration 11/19/2021  ? Atrial fibrillation, chronic ([place]) 11/19/2021  ? History of cardioembolic cerebrovascular accident (CVA) 11/19/2021  ? Acute kidney injury ([place]) 11/19/2021  ? AKI (acute kidney injury) ([place]) 11/18/2021  ? TIA (transient ischemic attack) 07/30/2021  ? 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 Restless leg syndrome 06/28/2021  ? HTN (hypertension) 06/28/2021  ? Mixed hyperlipidemia 06/28/2021  ? Abnormal computed tomography angiography (CTA) 06/28/2021  ? Systemic lupus erythematosus ([place]) 06/28/2021  ? Hyperglycemia 06/27/2021  ? ?PCP:  Curt Jews, PA-C ?Pharmacy:   ?[name] ?[address] ?[place] [address] ?Phone: [phone] Fax: [phone] ? ?[id number] - [place], [place] - [address] ?[address] ?[place] [address] ?Phone: [phone] Fax: [phone] ? ? ? ? ?Social Determinants of  Health (SDOH) Interventions ?  ? ?Readmission Risk Interventions ?   ? View : No data to display.  ?  ?  ?  ? ? ? ?

## 2021-11-20 DIAGNOSIS — K5900 Constipation, unspecified: Secondary | ICD-10-CM | POA: Diagnosis not present

## 2021-11-20 DIAGNOSIS — N179 Acute kidney failure, unspecified: Secondary | ICD-10-CM | POA: Diagnosis not present

## 2021-11-20 DIAGNOSIS — K625 Hemorrhage of anus and rectum: Secondary | ICD-10-CM

## 2021-11-20 DIAGNOSIS — D631 Anemia in chronic kidney disease: Secondary | ICD-10-CM

## 2021-11-20 DIAGNOSIS — N189 Chronic kidney disease, unspecified: Secondary | ICD-10-CM

## 2021-11-20 DIAGNOSIS — D62 Acute posthemorrhagic anemia: Secondary | ICD-10-CM

## 2021-11-20 LAB — CBC
HCT: 26.4 % — ABNORMAL LOW (ref 36.0–46.0)
HCT: 27.8 % — ABNORMAL LOW (ref 36.0–46.0)
Hemoglobin: 9 g/dL — ABNORMAL LOW (ref 12.0–15.0)
Hemoglobin: 9.2 g/dL — ABNORMAL LOW (ref 12.0–15.0)
MCH: 30 pg (ref 26.0–34.0)
MCH: 29.4 pg (ref 26.0–34.0)
MCHC: 34.1 g/dL (ref 30.0–36.0)
MCHC: 33.1 g/dL (ref 30.0–36.0)
MCV: 88 fL (ref 80.0–100.0)
MCV: 88.8 fL (ref 80.0–100.0)
Platelets: 261 10*3/uL (ref 150–400)
Platelets: 281 10*3/uL (ref 150–400)
RBC: 3 MIL/uL — ABNORMAL LOW (ref 3.87–5.11)
RBC: 3.13 MIL/uL — ABNORMAL LOW (ref 3.87–5.11)
RDW: 16.2 % — ABNORMAL HIGH (ref 11.5–15.5)
RDW: 16.2 % — ABNORMAL HIGH (ref 11.5–15.5)
WBC: 7.2 10*3/uL (ref 4.0–10.5)
WBC: 6.5 10*3/uL (ref 4.0–10.5)
nRBC: 0 % (ref 0.0–0.2)
nRBC: 0 % (ref 0.0–0.2)

## 2021-11-20 LAB — BASIC METABOLIC PANEL
Anion gap: 12 (ref 5–15)
BUN: 48 mg/dL — ABNORMAL HIGH (ref 8–23)
CO2: 15 mmol/L — ABNORMAL LOW (ref 22–32)
Calcium: 8.1 mg/dL — ABNORMAL LOW (ref 8.9–10.3)
Chloride: 106 mmol/L (ref 98–111)
Creatinine, Ser: 3.81 mg/dL — ABNORMAL HIGH (ref 0.44–1.00)
GFR, Estimated: 12 mL/min — ABNORMAL LOW (ref >60)
Glucose, Bld: 67 mg/dL — ABNORMAL LOW (ref 70–99)
Potassium: 3.6 mmol/L (ref 3.5–5.1)
Sodium: 133 mmol/L — ABNORMAL LOW (ref 135–145)

## 2021-11-20 MED ORDER — HYDRALAZINE HCL 50 MG PO TABS
50.0000 mg | ORAL_TABLET | Freq: Three times a day (TID) | ORAL | Status: DC
  Administered 2021-11-20 – 2021-11-22 (×6): 50 mg via ORAL
  Filled 2021-11-20 (×6): qty 1

## 2021-11-20 MED ORDER — POLYETHYLENE GLYCOL 3350 17 G PO PACK
17.0000 g | PACK | Freq: Three times a day (TID) | ORAL | Status: DC
  Administered 2021-11-20 – 2021-11-29 (×16): 17 g via ORAL
  Filled 2021-11-20 (×21): qty 1

## 2021-11-20 NOTE — H&P (View-Only) (Signed)
Patient ID: Kim [name], female   DOB: [birth year], 76 y.o.   MRN: [id number] ? ? ? Progress Note ? ? Subjective  ? Day # 2 ? CC; impaction, rectal bleeding-in setting of Eliquis use ? ?Hemoglobin down to 6.3 yesterday, transfused-to 9 this a.m. ? ?Patient has been disimpacted, and has been started on MiraLAX ?I spoke with patient via interpreter this morning, she complains of just mild lower abdominal discomfort, continues to complain of rectal pain, has had a small amount of rectal bleeding, she says she has not been able to have a bowel movement ? ? Objective  ? ?Vital signs in last 24 hours: ?Temp:  [97.7 ?F (36.5 ?C)-98.6 ?F (37 ?C)] 97.7 ?F (36.5 ?C) (05/13 [id number]) ?Pulse Rate:  [54-87] 56 (05/13 0843) ?Resp:  [15-18] 17 (05/13 0843) ?BP: (130-178)/(70-91) 178/89 (05/13 0843) ?SpO2:  [99 %-100 %] 100 % (05/13 0843) ?Last BM Date : 11/19/21 ?General:    76 year old female in NAD ?Heart:  Regular rate and rhythm; no murmurs ?Lungs: Respirations even and unlabored, lungs CTA bilaterally ?Abdomen:  Soft, mildly tender in the suprapubic area ,normal bowel sounds. ?Extremities:  Without edema. ?Neurologic:  Alert and oriented,  grossly normal neurologically. ?Psych:  Cooperative. Normal mood and affect. ? ?Intake/Output from previous day: ?05/12 0701 - 05/13 0700 ?In: 3164.5 [P.O.:700; I.V.:1730.5; Blood:734] ?Out: 1450 [Urine:1450] ?Intake/Output this shift: ?No intake/output data recorded. ? ?Lab Results: ?Recent Labs  ?  11/18/21 ?2026 11/19/21 ?[id number] 11/20/21 ?0930  ?WBC 7.8 6.5 7.2  ?HGB 7.1* 6.3* 9.0*  ?HCT 21.2* 19.6* 26.4*  ?PLT 349 301 261  ? ?BMET ?Recent Labs  ?  11/18/21 ?2026 11/19/21 ?[id number] 11/20/21 ?0930  ?NA 132* 131* 133*  ?K 3.5 3.3* 3.6  ?CL 104 105 106  ?CO2 18* 15* 15*  ?GLUCOSE 107* 168* 67*  ?BUN 55* 52* 48*  ?CREATININE 4.33* 4.27* 3.81*  ?CALCIUM 8.5* 8.1* 8.1*  ? ?LFT ?Recent Labs  ?  11/19/21 ?0926  ?PROT 6.1*  ?ALBUMIN 2.4*  ?AST 19  ?ALT 12  ?ALKPHOS 60  ?BILITOT 0.3  ? ?PT/INR ?No results  for input(s): LABPROT, INR in the last 72 hours. ? ?Studies/Results: ?CT Abdomen Pelvis Wo Contrast ? ?Result Date: 11/18/2021 ?CLINICAL DATA:  Bowel obstruction, rectal pain, fecal incontinence EXAM: CT ABDOMEN AND PELVIS WITHOUT CONTRAST TECHNIQUE: Multidetector CT imaging of the abdomen and pelvis was performed following the standard protocol without IV contrast. RADIATION DOSE REDUCTION: This exam was performed according to the departmental dose-optimization program which includes automated exposure control, adjustment of the mA and/or kV according to patient size and/or use of iterative reconstruction technique. COMPARISON:  02/08/2019 FINDINGS: Lower chest: The visualized lung bases are clear. Moderate coronary artery calcification. Moderate hiatal hernia. Hepatobiliary: No focal liver abnormality is seen. No gallstones, gallbladder wall thickening, or biliary dilatation. Pancreas: Unremarkable Spleen: Unremarkable Adrenals/Urinary Tract: The adrenal glands are unremarkable. The kidneys are normal on this noncontrast examination. The bladder is distended with an estimated bladder volume of approximately 786 cc. The bladder is otherwise unremarkable. Stomach/Bowel: Large volume stool is seen within the rectal vault and there is mild circumferential rectal wall thickening and perirectal inflammatory stranding suggesting changes of stercoral proctitis. There is superimposed pelvic floor relaxation with moderate rectal prolapse descending roughly 5 cm below the pubococcygeal line. The stomach, small bowel, and large bowel are otherwise unremarkable. Appendix normal. No free intraperitoneal gas or fluid. Vascular/Lymphatic: Aortic atherosclerosis. No enlarged abdominal or pelvic lymph nodes. Reproductive: Uterus and bilateral adnexa are  unremarkable. Other: No abdominal wall hernia. Musculoskeletal: Remote L1 compression deformity with progressive loss of height noted and resultant focal kyphosis at the  thoracolumbar junction. No retropulsion. Advanced degenerative changes noted throughout the lumbar spine. Transitional lumbar anatomy with sacralization of L5 noted. No acute bone abnormality. IMPRESSION: Large volume stool within the rectal vault with superimposed inflammatory changes suggesting fecal impaction and/or stercoral proctitis. No evidence of perforation. Superimposed moderate rectal prolapse. Bladder distension within estimated bladder volume of 786 cc. This may relate to voluntary retention or bladder outlet obstruction. Progressive loss of height of remote L1 compression deformity now with resultant focal kyphosis at this level. No retropulsion. Aortic Atherosclerosis (ICD10-I70.0). Electronically Signed   By: Fidela Salisbury M.D.   On: 11/18/2021 20:20   ? ? ? ? Assessment / Plan:   ? ?#44 76 year old female status post recent CVA December 2022-on Eliquis ? ?#2 recent diagnosis of A-fib ?#3 fecal impaction, and abnormal CT imaging with mild circumferential rectal wall thickening and perirectal inflammatory stranding suggesting changes of stercoral proctitis on the pelvic floor relaxation and moderate rectal prolapse. ? ?#4 anemia acute on chronic secondary to GI blood loss, not iron deficient ?#5 acute kidney injury-improving ? ? ?Plan; okay for diet today ?Continue slow MiraLAX purge ?As patient will need to be reanticoagulated, will plan for flexible sigmoidoscopy tomorrow to further assess the proctitis, and probable stercoral ulceration- ?Discussed this with the patient this morning, via the interpreter and she is agreeable.  I will also discussed with her daughter ? ? ? ? ? ? ?Principal Problem: ?  AKI (acute kidney injury) ([place]) ?Active Problems: ?  Restless leg syndrome ?  HTN (hypertension) ?  Mixed hyperlipidemia ?  Systemic lupus erythematosus ([place]) ?  (HFpEF) heart failure with preserved ejection fraction ([place]) ?  Anemia ?  Constipation ?  Stercoral ulcer of anus ?  Dehydration ?  Atrial  fibrillation, chronic ([place]) ?  History of cardioembolic cerebrovascular accident (CVA) ?  Acute kidney injury ([place]) ? ? ? ? LOS: 1 day  ? ?Amy Esterwood PA-C 11/20/2021, 11:30 AM ? ?I have taken an interval history, thoroughly reviewed the chart and examined the patient. I agree with the Advanced Practitioner's note, impression and recommendations, and have recorded additional findings, impressions and recommendations below. ?I performed a substantive portion of this encounter (>50% time spent), including a complete performance of the medical decision making. ? ?My additional thoughts are as follows: ? ?Obstipation, suspected stercoral ulcer, has had further small-volume rectal bleeding. ?Hemodynamically stable, has undergone manual disimpaction and received MiraLAX. ? ?Acute blood loss anemia on anemia chronic kidney disease, improved after transfusion yesterday. ? ?Our tentative plan is for a sigmoidoscopy tomorrow after 1000 cc tapwater enema, pending discussion and consent with the patient's daughter.  Our PA left a message for the daughter earlier today and we are awaiting a return call. ?However, the patient was agreeable when it was discussed with a video phone interpreter earlier today. ? ?Nelida Meuse III ?Office:[phone] ? ?  ?

## 2021-11-20 NOTE — Plan of Care (Signed)
  Problem: Clinical Measurements: Goal: Diagnostic test results will improve Outcome: Progressing   

## 2021-11-20 NOTE — Progress Notes (Signed)
?PROGRESS NOTE ? ? ? ?Kim [name]  [id number] DOB: 1946/02/20 DOA: 11/18/2021 ?PCP: Curt Jews, PA-C ? ? ?Brief Narrative:  ?Kim [name] is a 76 y.o. female with medical history significant of SLE with rheumatoid arthritis, recent left cerebral artery embolic CVA hospitalized both in December 2022 and once again in January 2023, hypertension, heart failure with preserved EF/diastolic dysfunction and dyslipidemia, presented to the ER with complaints of constipation and rectal pain.  This has been ongoing since her recent stroke.  Has been using Dulcolax suppositories without improvement in symptoms.  Now having leakage of watery stool.  No bleeding noted.  Had implantable cardiac monitor in place due to recent stroke and was recently diagnosed with atrial fibrillation and started on Eliquis on 5/2.  Because of this Plavix was discontinued but aspirin was recommended to remain in place.  Labs obtained in the ER revealed acute kidney injury with a BUN of 55 and a creatinine of 4.33 with low sodium of 132 and CO2 of 18 consistent with contraction alkalosis/dehydration.  Baseline renal function 09/17/2021 was BUN of 14 with a creatinine of 0.70.  Abdomen CT in the ER revealed large volume of stool within the rectal vault with superimposed inflammatory changes suggesting fecal impaction with associated stercoral proctitis and a moderate rectal prolapse.  She was also noted with bladder distention with a volume of 786 cc so a Foley catheter was placed.  She was given 500 cc fluid bolus and hospitalist were asked to admit patient. ?  ?Assessment & Plan: ?  ?Principal Problem: ?  AKI (acute kidney injury) ([place]) ?Active Problems: ?  HTN (hypertension) ?  Mixed hyperlipidemia ?  Restless leg syndrome ?  Systemic lupus erythematosus ([place]) ?  (HFpEF) heart failure with preserved ejection fraction ([place]) ?  Anemia ?  Constipation ?  Stercoral ulcer of anus ?  Dehydration ?  Atrial fibrillation, chronic ([place]) ?  History of  cardioembolic cerebrovascular accident (CVA) ?  Acute kidney injury ([place]) ? ?Acute kidney injury/metabolic acidosis: Presented with creatinine of 4.3 which is improving at 3.8 today.  CO2 going down.  Continue current fluids.  Avoid nephrotoxic agents.  ?  ?Rectal impaction with stercoral proctitis/focal constipation ?Partially disimpacted by EDP with some blood noted during disimpaction.  Seen by GI.  Continuing MiraLAX prep.  May need colonoscopy or sigmoidoscopy.  Per GI. ? ?Acute blood loss anemia/possible rectal bleeding: Her hemoglobin dropped to less than 7 yesterday, she received 2 unit of PRBC transfusion.  Hemoglobin 9 now.  Monitor closely.  GI on board. ?  ?Acute urinary retention: Likely due to severe constipation.  Continue Foley catheter. ? ?Paroxysmal atrial fibrillation: Due to anemia, Eliquis was held.  She is not on any rate control medications, rates hovering around 55. ?  ?Recent left cerebellar artery embolic CVA December [id number]: Due to possible GI bleed, aspirin and Eliquis both on hold at this point in time.  Continue Crestor. ?  ?Failure to thrive in adult ?Previously had been on Megace without any response to this medication so it was discontinued according to daughter ?Patient encouraged to drink more fluids as opposed to eating significant volumes of food.  ?  ?Hypertension/DD1 w/ HFprEF on echo December 2022: Due to AKI, Cozaar was held and due to low blood pressure, hydralazine was held.  Blood pressure elevated.  Will resume hydralazine.  It appears that she was taking only twice daily, I will increase to 3 times daily. ?  ?SLE/RA ?Continue Plaquenil ?Given constipation  and nausea we will hold p.m. Ultram for now but can reassess ? ?DVT prophylaxis: SCDs Start: 11/19/21 0743 ?  Code Status: Full Code  ?Family Communication:  None present at bedside.  Plan of [name] discussed with patient in length and he/she verbalized understanding and agreed with it.  I will call daughter later today as  well. ? ?Status is: Inpatient ?Remains inpatient appropriate because: Improving creatinine and may need colonoscopy. ? ? ?Estimated body mass index is 21.83 kg/m? as calculated from the following: ?  Height as of this encounter: 5' (1.524 m). ?  Weight as of this encounter: 50.7 kg. ? ?  ?Nutritional Assessment: ?Body mass index is 21.83 kg/m?Marland KitchenMarland Kitchen ?Seen by dietician.  I agree with the assessment and plan as outlined below: ?Nutrition Status: ?Nutrition Problem: Inadequate oral intake ?Etiology: poor appetite ?Signs/Symptoms: per patient/family report ?Interventions: Ensure Enlive (each supplement provides 350kcal and 20 grams of protein), MVI, Refer to RD note for recommendations ? ?. ?Skin Assessment: ?I have examined the patient's skin and I agree with the wound assessment as performed by the wound care RN as outlined below: ?  ? ?Consultants:  ?GI ? ?Procedures:  ?None ? ?Antimicrobials:  ?Anti-infectives (From admission, onward)  ? ? Start     Dose/Rate Route Frequency Ordered Stop  ? 11/19/21 1000  hydroxychloroquine (PLAQUENIL) tablet 200 mg       ? 200 mg Oral Daily 11/19/21 0750    ? ?  ?  ? ? ?Subjective: ?Seen and examined.  Still complains of constipation and rectal pain.  Some intermittent nausea but no vomiting.  No other complaint. ? ?Objective: ?Vitals:  ? 11/20/21 0040 11/20/21 0125 11/20/21 0522 11/20/21 0843  ?BP: (!) 158/87 (!) 152/80 (!) 164/88 (!) 178/89  ?Pulse: 61 60 (!) 54 (!) 56  ?Resp: 16 16 18 17   ?Temp: 98.2 ?F (36.8 ?C) 97.8 ?F (36.6 ?C) 98.6 ?F (37 ?C) 97.7 ?F (36.5 ?C)  ?TempSrc: Oral Oral    ?SpO2: 99% 100% 100% 100%  ?Weight:      ?Height:      ? ? ?Intake/Output Summary (Last 24 hours) at 11/20/2021 1102 ?Last data filed at 11/20/2021 [id number] ?Gross per 24 hour  ?Intake 3164.47 ml  ?Output 1450 ml  ?Net 1714.47 ml  ? ?Filed Weights  ? 11/18/21 1928 11/19/21 0459  ?Weight: 50.3 kg 50.7 kg  ? ? ?Examination: ? ?General exam: Appears calm and comfortable, appears cachectic ?Respiratory  system: Clear to auscultation. Respiratory effort normal. ?Cardiovascular system: S1 & S2 heard, RRR. No JVD, murmurs, rubs, gallops or clicks. No pedal edema. ?Gastrointestinal system: Abdomen is nondistended, soft and nontender. No organomegaly or masses felt. Normal bowel sounds heard. ?Central nervous system: Alert and oriented. No focal neurological deficits. ?Extremities: Symmetric 5 x 5 power. ?Skin: No rashes, lesions or ulcers ?Psychiatry: Judgement and insight appear normal. Mood & affect appropriate.  ? ? ?Data Reviewed: I have personally reviewed following labs and imaging studies ? ?CBC: ?Recent Labs  ?Lab 11/18/21 ?2026 11/19/21 ?[id number] 11/20/21 ?0930  ?WBC 7.8 6.5 7.2  ?NEUTROABS 6.0 4.6  --   ?HGB 7.1* 6.3* 9.0*  ?HCT 21.2* 19.6* 26.4*  ?MCV 89.8 92.9 88.0  ?PLT 349 301 261  ? ?Basic Metabolic Panel: ?Recent Labs  ?Lab 11/18/21 ?2026 11/19/21 ?[id number] 11/20/21 ?0930  ?NA 132* 131* 133*  ?K 3.5 3.3* 3.6  ?CL 104 105 106  ?CO2 18* 15* 15*  ?GLUCOSE 107* 168* 67*  ?BUN 55* 52* 48*  ?CREATININE 4.33*  4.27* 3.81*  ?CALCIUM 8.5* 8.1* 8.1*  ?MG  --  1.7  --   ?PHOS  --  4.4  --   ? ?GFR: ?Estimated Creatinine Clearance: 9 mL/min (A) (by C-G formula based on SCr of 3.81 mg/dL (H)). ?Liver Function Tests: ?Recent Labs  ?Lab 11/18/21 ?2026 11/19/21 ?0926  ?AST 19 19  ?ALT 13 12  ?ALKPHOS 70 60  ?BILITOT 0.5 0.3  ?PROT 7.4 6.1*  ?ALBUMIN 2.7* 2.4*  ? ?No results for input(s): LIPASE, AMYLASE in the last 168 hours. ?No results for input(s): AMMONIA in the last 168 hours. ?Coagulation Profile: ?No results for input(s): INR, PROTIME in the last 168 hours. ?Cardiac Enzymes: ?No results for input(s): CKTOTAL, CKMB, CKMBINDEX, TROPONINI in the last 168 hours. ?BNP (last 3 results) ?No results for input(s): PROBNP in the last 8760 hours. ?HbA1C: ?No results for input(s): HGBA1C in the last 72 hours. ?CBG: ?No results for input(s): GLUCAP in the last 168 hours. ?Lipid Profile: ?No results for input(s): CHOL, HDL, LDLCALC,  TRIG, CHOLHDL, LDLDIRECT in the last 72 hours. ?Thyroid Function Tests: ?Recent Labs  ?  11/19/21 ?0926  ?TSH 2.465  ? ?Anemia Panel: ?Recent Labs  ?  11/19/21 ?0926  ?VITAMINB12 766  ?FOLATE 8.8  ?FERRITIN

## 2021-11-20 NOTE — Progress Notes (Addendum)
Patient ID: Kim [name], female   DOB: July 06, 1946, 76 y.o.   MRN: [id number] ? ? ? Progress Note ? ? Subjective  ? Day # 2 ? CC; impaction, rectal bleeding-in setting of Eliquis use ? ?Hemoglobin down to 6.3 yesterday, transfused-to 9 this a.m. ? ?Patient has been disimpacted, and has been started on MiraLAX ?I spoke with patient via interpreter this morning, she complains of just mild lower abdominal discomfort, continues to complain of rectal pain, has had a small amount of rectal bleeding, she says she has not been able to have a bowel movement ? ? Objective  ? ?Vital signs in last 24 hours: ?Temp:  [97.7 ?F (36.5 ?C)-98.6 ?F (37 ?C)] 97.7 ?F (36.5 ?C) (05/13 [id number]) ?Pulse Rate:  [54-87] 56 (05/13 0843) ?Resp:  [15-18] 17 (05/13 0843) ?BP: (130-178)/(70-91) 178/89 (05/13 0843) ?SpO2:  [99 %-100 %] 100 % (05/13 0843) ?Last BM Date : 11/19/21 ?General:    Elderly [place] female in NAD ?Heart:  Regular rate and rhythm; no murmurs ?Lungs: Respirations even and unlabored, lungs CTA bilaterally ?Abdomen:  Soft, mildly tender in the suprapubic area ,normal bowel sounds. ?Extremities:  Without edema. ?Neurologic:  Alert and oriented,  grossly normal neurologically. ?Psych:  Cooperative. Normal mood and affect. ? ?Intake/Output from previous day: ?05/12 0701 - 05/13 0700 ?In: 3164.5 [P.O.:700; I.V.:1730.5; Blood:734] ?Out: 1450 [Urine:1450] ?Intake/Output this shift: ?No intake/output data recorded. ? ?Lab Results: ?Recent Labs  ?  11/18/21 ?2026 11/19/21 ?[id number] 11/20/21 ?0930  ?WBC 7.8 6.5 7.2  ?HGB 7.1* 6.3* 9.0*  ?HCT 21.2* 19.6* 26.4*  ?PLT 349 301 261  ? ?BMET ?Recent Labs  ?  11/18/21 ?2026 11/19/21 ?[id number] 11/20/21 ?0930  ?NA 132* 131* 133*  ?K 3.5 3.3* 3.6  ?CL 104 105 106  ?CO2 18* 15* 15*  ?GLUCOSE 107* 168* 67*  ?BUN 55* 52* 48*  ?CREATININE 4.33* 4.27* 3.81*  ?CALCIUM 8.5* 8.1* 8.1*  ? ?LFT ?Recent Labs  ?  11/19/21 ?0926  ?PROT 6.1*  ?ALBUMIN 2.4*  ?AST 19  ?ALT 12  ?ALKPHOS 60  ?BILITOT 0.3  ? ?PT/INR ?No results  for input(s): LABPROT, INR in the last 72 hours. ? ?Studies/Results: ?CT Abdomen Pelvis Wo Contrast ? ?Result Date: 11/18/2021 ?CLINICAL DATA:  Bowel obstruction, rectal pain, fecal incontinence EXAM: CT ABDOMEN AND PELVIS WITHOUT CONTRAST TECHNIQUE: Multidetector CT imaging of the abdomen and pelvis was performed following the standard protocol without IV contrast. RADIATION DOSE REDUCTION: This exam was performed according to the departmental dose-optimization program which includes automated exposure control, adjustment of the mA and/or kV according to patient size and/or use of iterative reconstruction technique. COMPARISON:  02/08/2019 FINDINGS: Lower chest: The visualized lung bases are clear. Moderate coronary artery calcification. Moderate hiatal hernia. Hepatobiliary: No focal liver abnormality is seen. No gallstones, gallbladder wall thickening, or biliary dilatation. Pancreas: Unremarkable Spleen: Unremarkable Adrenals/Urinary Tract: The adrenal glands are unremarkable. The kidneys are normal on this noncontrast examination. The bladder is distended with an estimated bladder volume of approximately 786 cc. The bladder is otherwise unremarkable. Stomach/Bowel: Large volume stool is seen within the rectal vault and there is mild circumferential rectal wall thickening and perirectal inflammatory stranding suggesting changes of stercoral proctitis. There is superimposed pelvic floor relaxation with moderate rectal prolapse descending roughly 5 cm below the pubococcygeal line. The stomach, small bowel, and large bowel are otherwise unremarkable. Appendix normal. No free intraperitoneal gas or fluid. Vascular/Lymphatic: Aortic atherosclerosis. No enlarged abdominal or pelvic lymph nodes. Reproductive: Uterus and bilateral adnexa are  unremarkable. Other: No abdominal wall hernia. Musculoskeletal: Remote L1 compression deformity with progressive loss of height noted and resultant focal kyphosis at the  thoracolumbar junction. No retropulsion. Advanced degenerative changes noted throughout the lumbar spine. Transitional lumbar anatomy with sacralization of L5 noted. No acute bone abnormality. IMPRESSION: Large volume stool within the rectal vault with superimposed inflammatory changes suggesting fecal impaction and/or stercoral proctitis. No evidence of perforation. Superimposed moderate rectal prolapse. Bladder distension within estimated bladder volume of 786 cc. This may relate to voluntary retention or bladder outlet obstruction. Progressive loss of height of remote L1 compression deformity now with resultant focal kyphosis at this level. No retropulsion. Aortic Atherosclerosis (ICD10-I70.0). Electronically Signed   By: Fidela Salisbury M.D.   On: 11/18/2021 20:20   ? ? ? ? Assessment / Plan:   ? ?#106 76 year old female status post recent CVA December 2022-on Eliquis ? ?#2 recent diagnosis of A-fib ?#3 fecal impaction, and abnormal CT imaging with mild circumferential rectal wall thickening and perirectal inflammatory stranding suggesting changes of stercoral proctitis on the pelvic floor relaxation and moderate rectal prolapse. ? ?#4 anemia acute on chronic secondary to GI blood loss, not iron deficient ?#5 acute kidney injury-improving ? ? ?Plan; okay for diet today ?Continue slow MiraLAX purge ?As patient will need to be reanticoagulated, will plan for flexible sigmoidoscopy tomorrow to further assess the proctitis, and probable stercoral ulceration- ?Discussed this with the patient this morning, via the interpreter and she is agreeable.  I will also discussed with her daughter ? ? ? ? ? ? ?Principal Problem: ?  AKI (acute kidney injury) ([place]) ?Active Problems: ?  Restless leg syndrome ?  HTN (hypertension) ?  Mixed hyperlipidemia ?  Systemic lupus erythematosus ([place]) ?  (HFpEF) heart failure with preserved ejection fraction ([place]) ?  Anemia ?  Constipation ?  Stercoral ulcer of anus ?  Dehydration ?  Atrial  fibrillation, chronic ([place]) ?  History of cardioembolic cerebrovascular accident (CVA) ?  Acute kidney injury ([place]) ? ? ? ? LOS: 1 day  ? ?Amy Esterwood PA-C 11/20/2021, 11:30 AM ? ?I have taken an interval history, thoroughly reviewed the chart and examined the patient. I agree with the Advanced Practitioner's note, impression and recommendations, and have recorded additional findings, impressions and recommendations below. ?I performed a substantive portion of this encounter (>50% time spent), including a complete performance of the medical decision making. ? ?My additional thoughts are as follows: ? ?Obstipation, suspected stercoral ulcer, has had further small-volume rectal bleeding. ?Hemodynamically stable, has undergone manual disimpaction and received MiraLAX. ? ?Acute blood loss anemia on anemia chronic kidney disease, improved after transfusion yesterday. ? ?Our tentative plan is for a sigmoidoscopy tomorrow after 1000 cc tapwater enema, pending discussion and consent with the patient's daughter.  Our PA left a message for the daughter earlier today and we are awaiting a return call. ?However, the patient was agreeable when it was discussed with a video phone interpreter earlier today. ? ?Nelida Meuse III ?[phone] ? ?  ?

## 2021-11-21 ENCOUNTER — Encounter (HOSPITAL_COMMUNITY)
Admission: EM | Disposition: A | Discharge status: Home Health | Payer: Self-pay | Source: Home / Self Care | Attending: Internal Medicine

## 2021-11-21 ENCOUNTER — Inpatient Hospital Stay (HOSPITAL_COMMUNITY): Payer: Medicare Other | Admitting: Anesthesiology

## 2021-11-21 ENCOUNTER — Encounter (HOSPITAL_COMMUNITY): Payer: Self-pay | Admitting: Internal Medicine

## 2021-11-21 DIAGNOSIS — K911 Postgastric surgery syndromes: Secondary | ICD-10-CM | POA: Diagnosis not present

## 2021-11-21 DIAGNOSIS — Z8673 Personal history of transient ischemic attack (TIA), and cerebral infarction without residual deficits: Secondary | ICD-10-CM

## 2021-11-21 DIAGNOSIS — I10 Essential (primary) hypertension: Secondary | ICD-10-CM

## 2021-11-21 DIAGNOSIS — K626 Ulcer of anus and rectum: Secondary | ICD-10-CM

## 2021-11-21 DIAGNOSIS — K921 Melena: Secondary | ICD-10-CM

## 2021-11-21 DIAGNOSIS — N179 Acute kidney failure, unspecified: Secondary | ICD-10-CM | POA: Diagnosis not present

## 2021-11-21 DIAGNOSIS — Q438 Other specified congenital malformations of intestine: Secondary | ICD-10-CM

## 2021-11-21 DIAGNOSIS — K922 Gastrointestinal hemorrhage, unspecified: Secondary | ICD-10-CM | POA: Diagnosis not present

## 2021-11-21 DIAGNOSIS — K5641 Fecal impaction: Principal | ICD-10-CM

## 2021-11-21 HISTORY — PX: FLEXIBLE SIGMOIDOSCOPY: SHX5431

## 2021-11-21 LAB — TYPE AND SCREEN
ABO/RH(D): O POS
Antibody Screen: NEGATIVE
Unit division: 0
Unit division: 0

## 2021-11-21 LAB — SURGICAL PCR SCREEN
MRSA, PCR: NEGATIVE
Staphylococcus aureus: POSITIVE — AB

## 2021-11-21 LAB — BASIC METABOLIC PANEL
Anion gap: 9 (ref 5–15)
BUN: 43 mg/dL — ABNORMAL HIGH (ref 8–23)
CO2: 17 mmol/L — ABNORMAL LOW (ref 22–32)
Calcium: 7.7 mg/dL — ABNORMAL LOW (ref 8.9–10.3)
Chloride: 104 mmol/L (ref 98–111)
Creatinine, Ser: 3.48 mg/dL — ABNORMAL HIGH (ref 0.44–1.00)
GFR, Estimated: 13 mL/min — ABNORMAL LOW (ref >60)
Glucose, Bld: 90 mg/dL (ref 70–99)
Potassium: 3.1 mmol/L — ABNORMAL LOW (ref 3.5–5.1)
Sodium: 130 mmol/L — ABNORMAL LOW (ref 135–145)

## 2021-11-21 LAB — CBC
HCT: 24.2 % — ABNORMAL LOW (ref 36.0–46.0)
Hemoglobin: 8.4 g/dL — ABNORMAL LOW (ref 12.0–15.0)
MCH: 30.1 pg (ref 26.0–34.0)
MCHC: 34.7 g/dL (ref 30.0–36.0)
MCV: 86.7 fL (ref 80.0–100.0)
Platelets: 249 10*3/uL (ref 150–400)
RBC: 2.79 MIL/uL — ABNORMAL LOW (ref 3.87–5.11)
RDW: 16 % — ABNORMAL HIGH (ref 11.5–15.5)
WBC: 6.3 10*3/uL (ref 4.0–10.5)
nRBC: 0 % (ref 0.0–0.2)

## 2021-11-21 LAB — BPAM RBC
Blood Product Expiration Date: 202306072359
Blood Product Expiration Date: 202306072359
ISSUE DATE / TIME: 202305121820
ISSUE DATE / TIME: 202305130105
Unit Type and Rh: 5100
Unit Type and Rh: 5100

## 2021-11-21 SURGERY — SIGMOIDOSCOPY, FLEXIBLE
Anesthesia: Monitor Anesthesia Care

## 2021-11-21 MED ORDER — CHLORHEXIDINE GLUCONATE CLOTH 2 % EX PADS
6.0000 | MEDICATED_PAD | Freq: Every day | CUTANEOUS | Status: AC
  Administered 2021-11-22 – 2021-11-26 (×4): 6 via TOPICAL

## 2021-11-21 MED ORDER — TRAMADOL HCL 50 MG PO TABS
50.0000 mg | ORAL_TABLET | Freq: Four times a day (QID) | ORAL | Status: DC | PRN
  Administered 2021-11-21 – 2021-11-28 (×7): 50 mg via ORAL
  Filled 2021-11-21 (×9): qty 1

## 2021-11-21 MED ORDER — POTASSIUM CHLORIDE CRYS ER 20 MEQ PO TBCR: 40.0000 meq | EXTENDED_RELEASE_TABLET | ORAL | Status: AC

## 2021-11-21 MED ORDER — PROPOFOL 10 MG/ML IV BOLUS
INTRAVENOUS | Status: DC | PRN
  Administered 2021-11-21 (×3): 20 mg via INTRAVENOUS
  Administered 2021-11-21: 30 mg via INTRAVENOUS
  Administered 2021-11-21: 10 mg via INTRAVENOUS

## 2021-11-21 MED ORDER — MUPIROCIN 2 % EX OINT
1.0000 "application " | TOPICAL_OINTMENT | Freq: Two times a day (BID) | CUTANEOUS | Status: AC
  Administered 2021-11-21 – 2021-11-26 (×10): 1 via NASAL
  Filled 2021-11-21 (×2): qty 22

## 2021-11-21 NOTE — Interval H&P Note (Signed)
History and Physical Interval Note: ? ?11/21/2021 ?12:59 PM ? ?Kim [name]  has presented today for surgery, with the diagnosis of Fecal impaction, rectal bleeding, probable stercoral ulcer.  The various methods of treatment have been discussed with the patient and family. After consideration of risks, benefits and other options for treatment, the patient has consented to  Procedure(s): ?FLEXIBLE SIGMOIDOSCOPY (N/A) as a surgical intervention.  The patient's history has been reviewed, patient examined, no change in status, stable for surgery.  I have reviewed the patient's chart and labs.  Questions were answered to the patient's satisfaction.   ? ? ?[name] ? ? ?

## 2021-11-21 NOTE — Anesthesia Postprocedure Evaluation (Signed)
Anesthesia Post Note ? ?Patient: Kim [name] ? ?Procedure(s) Performed: FLEXIBLE SIGMOIDOSCOPY ? ?  ? ?Patient location during evaluation: PACU ?Anesthesia Type: MAC ?Level of consciousness: awake ?Pain management: pain level controlled ?Vital Signs Assessment: post-procedure vital signs reviewed and stable ?Respiratory status: spontaneous breathing, nonlabored ventilation, respiratory function stable and patient connected to nasal cannula oxygen ?Cardiovascular status: stable and blood pressure returned to baseline ?Postop Assessment: no apparent nausea or vomiting ?Anesthetic complications: no ? ? ?No notable events documented. ? ?Last Vitals:  ?Vitals:  ? 11/21/21 1315 11/21/21 1332  ?BP: 137/70 (!) 142/73  ?Pulse: (!) 56 (!) 59  ?Resp: (!) 21 19  ?Temp: 36.7 ?C   ?SpO2: 100% 98%  ?  ?Last Pain:  ?Vitals:  ? 11/21/21 1315  ?TempSrc:   ?PainSc: 0-No pain  ? ? ?  ?  ?  ?  ?  ?  ? ?Kim [name] ? ? ? ? ?

## 2021-11-21 NOTE — Progress Notes (Signed)
?PROGRESS NOTE ? ? ? ?Kim [name]  HGD:[id number] DOB: Feb 28, 1946 DOA: 11/18/2021 ?PCP: Judd Lien, PA-C ? ? ?Brief Narrative:  ?Kim [name] is a 76 y.o. female with medical history significant of SLE with rheumatoid arthritis, recent left cerebral artery embolic CVA hospitalized both in December 2022 and once again in January 2023, hypertension, heart failure with preserved EF/diastolic dysfunction and dyslipidemia, presented to the ER with complaints of constipation and rectal pain.  This has been ongoing since her recent stroke.  Has been using Dulcolax suppositories without improvement in symptoms.  Now having leakage of watery stool.  No bleeding noted.  Had implantable cardiac monitor in place due to recent stroke and was recently diagnosed with atrial fibrillation and started on Eliquis on 5/2.  Because of this Plavix was discontinued but aspirin was recommended to remain in place.  Labs obtained in the ER revealed acute kidney injury with a BUN of 55 and a creatinine of 4.33 with low sodium of 132 and CO2 of 18 consistent with contraction alkalosis/dehydration.  Baseline renal function 09/17/2021 was BUN of 14 with a creatinine of 0.70.  Abdomen CT in the ER revealed large volume of stool within the rectal vault with superimposed inflammatory changes suggesting fecal impaction with associated stercoral proctitis and a moderate rectal prolapse.  She was also noted with bladder distention with a volume of 786 cc so a Foley catheter was placed.  She was given 500 cc fluid bolus and hospitalist were asked to admit patient. ?  ?Assessment & Plan: ?  ?Principal Problem: ?  AKI (acute kidney injury) (HCC) ?Active Problems: ?  HTN (hypertension) ?  Mixed hyperlipidemia ?  Restless leg syndrome ?  Systemic lupus erythematosus (HCC) ?  (HFpEF) heart failure with preserved ejection fraction (HCC) ?  Anemia ?  Constipation ?  Stercoral ulcer of anus ?  Dehydration ?  Atrial fibrillation, chronic (HCC) ?  History of  cardioembolic cerebrovascular accident (CVA) ?  Acute kidney injury (HCC) ? ?Acute kidney injury/metabolic acidosis: Presented with creatinine of 4.3 which is improving at 3.4 today.  CO2 improving as well continue current fluids.  Avoid nephrotoxic agents.  ?  ?Rectal impaction with stercoral proctitis/focal constipation ?Partially disimpacted by EDP with some blood noted during disimpaction.  Seen by GI.  Plan for flexible sigmoidoscopy today. ? ?Acute blood loss anemia/possible rectal bleeding: Her hemoglobin dropped to less than 7 yesterday, she received 2 unit of PRBC transfusion.  Hemoglobin 9 posttransfusion and fairly stable.  Monitor closely.  GI on board. ?  ?Acute urinary retention: Likely due to severe constipation.  Continue Foley catheter. ? ?Paroxysmal atrial fibrillation: Due to anemia, Eliquis was held.  She is not on any rate control medications, rates hovering around 55. ?  ?Recent left cerebellar artery embolic CVA December 2022: Due to possible GI bleed, aspirin and Eliquis both on hold at this point in time.  Continue Crestor. ?  ?Failure to thrive in adult ?Previously had been on Megace without any response to this medication so it was discontinued according to daughter ?Patient encouraged to drink more fluids as opposed to eating significant volumes of food.  ?  ?Hypertension/DD1 w/ HFprEF on echo December 2022: Due to AKI, Cozaar was held and due to low blood pressure, hydralazine was held.  Blood pressure elevated.  Will resume hydralazine.  It appears that she was taking only twice daily, increased to 3 times daily dosage.  Blood pressure slightly elevated, due to not receiving her a.m. medications  today. ?  ?SLE/RA ?Continue Plaquenil ?Given constipation and nausea we will hold p.m. Ultram for now but can reassess ? ?DVT prophylaxis: SCDs Start: 11/19/21 0743 ?  Code Status: Full Code  ?Family Communication:  None present at bedside.  Plan of care discussed with patient in length and  he/she verbalized understanding and agreed with it.  I will call daughter later today as well. ? ?Status is: Inpatient ?Remains inpatient appropriate because: Improving creatinine and  needs colonoscopy. ? ? ?Estimated body mass index is 21.83 kg/m? as calculated from the following: ?  Height as of this encounter: 5' (1.524 m). ?  Weight as of this encounter: 50.7 kg. ? ?  ?Nutritional Assessment: ?Body mass index is 21.83 kg/m?Marland KitchenMarland Kitchen ?Seen by dietician.  I agree with the assessment and plan as outlined below: ?Nutrition Status: ?Nutrition Problem: Inadequate oral intake ?Etiology: poor appetite ?Signs/Symptoms: per patient/family report ?Interventions: Ensure Enlive (each supplement provides 350kcal and 20 grams of protein), MVI, Refer to RD note for recommendations ? ?. ?Skin Assessment: ?I have examined the patient's skin and I agree with the wound assessment as performed by the wound care RN as outlined below: ?  ? ?Consultants:  ?GI ? ?Procedures:  ?None ? ?Antimicrobials:  ?Anti-infectives (From admission, onward)  ? ? Start     Dose/Rate Route Frequency Ordered Stop  ? 11/19/21 1000  hydroxychloroquine (PLAQUENIL) tablet 200 mg       ? 200 mg Oral Daily 11/19/21 0750    ? ?  ?  ? ? ?Subjective: ? ?Seen and examined.  Daughter-in-law at the bedside.  Patient still complains of pain at the rectal area with defecation.  No other complaint. ? ?Objective: ?Vitals:  ? 11/20/21 0843 11/20/21 1655 11/20/21 2107 11/21/21 0959  ?BP: (!) 178/89 (!) 142/81 (!) 155/97 (!) 176/77  ?Pulse: (!) 56 (!) 107 67 (!) 56  ?Resp: 17 16 18 15   ?Temp: 97.7 ?F (36.5 ?C) 98.5 ?F (36.9 ?C) 98.8 ?F (37.1 ?C) 98.7 ?F (37.1 ?C)  ?TempSrc:   Oral Oral  ?SpO2: 100% 100% 95% 97%  ?Weight:      ?Height:      ? ? ?Intake/Output Summary (Last 24 hours) at 11/21/2021 1146 ?Last data filed at 11/21/2021 0600 ?Gross per 24 hour  ?Intake 2948.18 ml  ?Output 1400 ml  ?Net 1548.18 ml  ? ? ?Filed Weights  ? 11/18/21 1928 11/19/21 0459  ?Weight: 50.3 kg 50.7  kg  ? ? ?Examination: ? ?General exam: Appears calm and comfortable, appears cachectic ?Respiratory system: Clear to auscultation. Respiratory effort normal. ?Cardiovascular system: S1 & S2 heard, RRR. No JVD, murmurs, rubs, gallops or clicks. No pedal edema. ?Gastrointestinal system: Abdomen is nondistended, soft and nontender. No organomegaly or masses felt. Normal bowel sounds heard. ?Central nervous system: Alert and oriented. No focal neurological deficits. ?Extremities: Symmetric 5 x 5 power. ?Skin: No rashes, lesions or ulcers.  ?Psychiatry: Judgement and insight appear normal. Mood & affect appropriate.  ? ?Data Reviewed: I have personally reviewed following labs and imaging studies ? ?CBC: ?Recent Labs  ?Lab 11/18/21 ?2026 11/19/21 ?[id number] 11/20/21 ?0930 11/20/21 ?1631 11/21/21 ?0706  ?WBC 7.8 6.5 7.2 6.5 6.3  ?NEUTROABS 6.0 4.6  --   --   --   ?HGB 7.1* 6.3* 9.0* 9.2* 8.4*  ?HCT 21.2* 19.6* 26.4* 27.8* 24.2*  ?MCV 89.8 92.9 88.0 88.8 86.7  ?PLT 349 301 261 281 249  ? ? ?Basic Metabolic Panel: ?Recent Labs  ?Lab 11/18/21 ?2026 11/19/21 ?[id number] 11/20/21 ?  0930 11/21/21 ?0706  ?NA 132* 131* 133* 130*  ?K 3.5 3.3* 3.6 3.1*  ?CL 104 105 106 104  ?CO2 18* 15* 15* 17*  ?GLUCOSE 107* 168* 67* 90  ?BUN 55* 52* 48* 43*  ?CREATININE 4.33* 4.27* 3.81* 3.48*  ?CALCIUM 8.5* 8.1* 8.1* 7.7*  ?MG  --  1.7  --   --   ?PHOS  --  4.4  --   --   ? ? ?GFR: ?Estimated Creatinine Clearance: 9.9 mL/min (A) (by C-G formula based on SCr of 3.48 mg/dL (H)). ?Liver Function Tests: ?Recent Labs  ?Lab 11/18/21 ?2026 11/19/21 ?0926  ?AST 19 19  ?ALT 13 12  ?ALKPHOS 70 60  ?BILITOT 0.5 0.3  ?PROT 7.4 6.1*  ?ALBUMIN 2.7* 2.4*  ? ? ?No results for input(s): LIPASE, AMYLASE in the last 168 hours. ?No results for input(s): AMMONIA in the last 168 hours. ?Coagulation Profile: ?No results for input(s): INR, PROTIME in the last 168 hours. ?Cardiac Enzymes: ?No results for input(s): CKTOTAL, CKMB, CKMBINDEX, TROPONINI in the last 168 hours. ?BNP (last  3 results) ?No results for input(s): PROBNP in the last 8760 hours. ?HbA1C: ?No results for input(s): HGBA1C in the last 72 hours. ?CBG: ?No results for input(s): GLUCAP in the last 168 hours. ?Lipid Pr

## 2021-11-21 NOTE — Anesthesia Preprocedure Evaluation (Addendum)
Anesthesia Evaluation  ?Patient identified by MRN, date of birth, ID band ?Patient awake ? ? ? ?Reviewed: ?Allergy & Precautions, NPO status , Patient's Chart, lab work & pertinent test results ? ?History of Anesthesia Complications ?(+) POST - OP SPINAL HEADACHE and history of anesthetic complications ? ?Airway ?Mallampati: II ? ?TM Distance: >3 FB ?Neck ROM: Full ? ? ? Dental ? ?(+) Edentulous Upper, Edentulous Lower, Dental Advisory Given ?  ?Pulmonary ?neg pulmonary ROS,  ?  ?Pulmonary exam normal ? ? ? ? ? ? ? Cardiovascular ?hypertension, Pt. on medications ?Normal cardiovascular exam+ dysrhythmias Atrial Fibrillation  ? ? ?  ?Neuro/Psych ?PSYCHIATRIC DISORDERS Depression TIACVA, Residual Symptoms   ? GI/Hepatic ?Neg liver ROS, PUD,   ?Endo/Other  ?negative endocrine ROS ? Renal/GU ?Renal disease  ? ?  ?Musculoskeletal ? ?(+) Arthritis ,  ? Abdominal ?  ?Peds ? Hematology ? ?(+) Blood dyscrasia, anemia ,   ?Anesthesia Other Findings ?Fecal impaction, rectal bleeding, probable stercoral ulcer ? Reproductive/Obstetrics ? ?  ? ? ? ? ? ? ? ? ? ? ? ? ? ?  ?  ? ? ? ? ? ? ?Anesthesia Physical ?Anesthesia Plan ? ?ASA: 3 ? ?Anesthesia Plan: MAC  ? ?Post-op Pain Management:   ? ?Induction: Intravenous ? ?PONV Risk Score and Plan: 2 and Propofol infusion and Treatment may vary due to age or medical condition ? ?Airway Management Planned: Simple Face Mask ? ?Additional Equipment:  ? ?Intra-op Plan:  ? ?Post-operative Plan:  ? ?Informed Consent: I have reviewed the patients History and Physical, chart, labs and discussed the procedure including the risks, benefits and alternatives for the proposed anesthesia with the patient or authorized representative who has indicated his/her understanding and acceptance.  ? ? ? ?Consent reviewed with POA ? ?Plan Discussed with: CRNA ? ?Anesthesia Plan Comments:   ? ? ? ? ? ?Anesthesia Quick Evaluation ? ?

## 2021-11-21 NOTE — Plan of Care (Signed)
  Problem: Clinical Measurements: Goal: Diagnostic test results will improve Outcome: Progressing   

## 2021-11-21 NOTE — Anesthesia Procedure Notes (Signed)
Procedure Name: [place] ?Date/Time: 11/21/2021 12:55 PM ?Performed by: Jenne Campus, CRNA ?Pre-anesthesia Checklist: Emergency Drugs available, Suction available and Patient being monitored ?Oxygen Delivery Method: Simple face mask ? ? ? ? ?

## 2021-11-21 NOTE — Transfer of Care (Signed)
Immediate Anesthesia Transfer of Care Note ? ?Patient: Kim [name] ? ?Procedure(s) Performed: FLEXIBLE SIGMOIDOSCOPY ? ?Patient Location: PACU ? ?Anesthesia Type:MAC ? ?Level of Consciousness: awake and patient cooperative ? ?Airway & Oxygen Therapy: Patient Spontanous Breathing and Patient connected to nasal cannula oxygen ? ?Post-op Assessment: Report given to RN and Post -op Vital signs reviewed and stable ? ?Post vital signs: Reviewed ? ?Last Vitals:  ?Vitals Value Taken Time  ?BP 137/70 11/21/21 1314  ?Temp    ?Pulse 57 11/21/21 1316  ?Resp 23 11/21/21 1316  ?SpO2 100 % 11/21/21 1316  ?Vitals shown include unvalidated device data. ? ?Last Pain:  ?Vitals:  ? 11/21/21 1232  ?TempSrc: Temporal  ?PainSc: 0-No pain  ?   ? ?  ? ?Complications: No notable events documented. ?

## 2021-11-21 NOTE — Op Note (Signed)
[facility] ?Patient Name: Kim [name] ?Procedure Date : 11/21/2021 ?MRN: [id number] ?Attending MD: [name] , MD ?Date of Birth: January 22, 1946 ?CSN: [id number] ?Age: 76 ?Admit Type: Inpatient ?Procedure:                Flexible Sigmoidoscopy ?Indications:              Hematochezia ?Providers:                [name], MD, Doristine Johns, RN, Elberta Fortis  ?                          Johnella Moloney, Technician ?Referring MD:              ?Medicines:                Monitored Anesthesia Care ?Complications:            No immediate complications. Estimated blood loss:  ?                          None. ?Estimated Blood Loss:     Estimated blood loss: none. ?Procedure:                Pre-Anesthesia Assessment: ?                          - Prior to the procedure, a History and Physical  ?                          was performed, and patient medications and  ?                          allergies were reviewed. The patient's tolerance of  ?                          previous anesthesia was also reviewed. The risks  ?                          and benefits of the procedure and the sedation  ?                          options and risks were discussed with the patient.  ?                          All questions were answered, and informed consent  ?                          was obtained. Prior Anticoagulants: The patient has  ?                          taken Eliquis (apixaban), last dose was 4 days  ?                          prior to procedure. ASA Grade Assessment: III - A  ?                          patient  with severe systemic disease. After  ?                          reviewing the risks and benefits, the patient was  ?                          deemed in satisfactory condition to undergo the  ?                          procedure. ?                          After obtaining informed consent, the scope was  ?                          passed under direct vision. The [id number]-[id number]  ?                          [id number])  Olympus peds colonoscope was introduced  ?                          through the anus and advanced to the the splenic  ?                          flexure. The flexible sigmoidoscopy was  ?                          accomplished without difficulty. The patient  ?                          tolerated the procedure well. The quality of the  ?                          bowel preparation was good. ?Scope In: ?Scope Out: ?Findings: ?     There was ulceration at her very distal rectum. Several, nearly  ?     confluent and circumferential, relatively shallow appearing ulcers  ?     without visible vessels. There was no bleeding. The surrounding mucosa  ?     was slightly edematous but not at all neoplastic appearing. ?     The exam to about the splenic flexure was otherwise without abnormality. ?Impression:               - Distal rectum stercoral ulcer without visible  ?                          vessels or current bleeding ?                          - Exam to the splenic flexure was otherwise normal. ?                          - Most important is to keep her from becoming  ?                          constipated again.  I recommend titrated daily,  ?                          scheduled miralax. ?                          - OK to resume her blood thinner tomorrow. ?                          - Please call or page if any further questions or  ?                          concerns. ?                          - Follow up with GI as needed, probably safe for  ?                          d/c later today or tomorrow. ?Recommendation:           - as above. ?Procedure Code(s):        --- Professional --- ?                          [phone], Sigmoidoscopy, flexible; diagnostic,  ?                          including collection of specimen(s) by brushing or  ?                          washing, when performed (separate procedure) ?Diagnosis Code(s):        --- Professional --- ?                          K92.1, Melena (includes Hematochezia) ?                           Q43.8, Other specified congenital malformations of  ?                          intestine ?CPT copyright 2019 [employer]. All rights reserved. ?The codes documented in this report are preliminary and upon coder review may  ?be revised to meet current compliance requirements. ?[name], MD ?11/21/2021 1:15:23 PM ?This report has been signed electronically. ?Number of Addenda: 0 ?

## 2021-11-22 ENCOUNTER — Encounter (HOSPITAL_COMMUNITY): Payer: Self-pay | Admitting: Gastroenterology

## 2021-11-22 DIAGNOSIS — N179 Acute kidney failure, unspecified: Secondary | ICD-10-CM | POA: Diagnosis not present

## 2021-11-22 LAB — CBC
HCT: 25.9 % — ABNORMAL LOW (ref 36.0–46.0)
Hemoglobin: 8.9 g/dL — ABNORMAL LOW (ref 12.0–15.0)
MCH: 30.3 pg (ref 26.0–34.0)
MCHC: 34.4 g/dL (ref 30.0–36.0)
MCV: 88.1 fL (ref 80.0–100.0)
Platelets: 235 10*3/uL (ref 150–400)
RBC: 2.94 MIL/uL — ABNORMAL LOW (ref 3.87–5.11)
RDW: 15.9 % — ABNORMAL HIGH (ref 11.5–15.5)
WBC: 6.3 10*3/uL (ref 4.0–10.5)
nRBC: 0 % (ref 0.0–0.2)

## 2021-11-22 LAB — BASIC METABOLIC PANEL
Anion gap: 8 (ref 5–15)
BUN: 37 mg/dL — ABNORMAL HIGH (ref 8–23)
CO2: 19 mmol/L — ABNORMAL LOW (ref 22–32)
Calcium: 7.9 mg/dL — ABNORMAL LOW (ref 8.9–10.3)
Chloride: 105 mmol/L (ref 98–111)
Creatinine, Ser: 3.16 mg/dL — ABNORMAL HIGH (ref 0.44–1.00)
GFR, Estimated: 15 mL/min — ABNORMAL LOW (ref >60)
Glucose, Bld: 90 mg/dL (ref 70–99)
Potassium: 3.5 mmol/L (ref 3.5–5.1)
Sodium: 132 mmol/L — ABNORMAL LOW (ref 135–145)

## 2021-11-22 MED ORDER — APIXABAN 2.5 MG PO TABS
2.5000 mg | ORAL_TABLET | Freq: Two times a day (BID) | ORAL | Status: DC
  Administered 2021-11-22 – 2021-11-25 (×7): 2.5 mg via ORAL
  Filled 2021-11-22 (×7): qty 1

## 2021-11-22 MED ORDER — HYDRALAZINE HCL 50 MG PO TABS
100.0000 mg | ORAL_TABLET | Freq: Three times a day (TID) | ORAL | Status: DC
Start: 2021-11-22 — End: 2021-11-29
  Administered 2021-11-22 – 2021-11-29 (×20): 100 mg via ORAL
  Filled 2021-11-22 (×20): qty 2

## 2021-11-22 MED ORDER — SODIUM CHLORIDE 0.9 % IV SOLN: INTRAVENOUS | Status: DC

## 2021-11-22 NOTE — Evaluation (Signed)
Occupational Therapy Evaluation ?Patient Details ?Name: Kim [name] ?MRN: [id number] ?DOB: 08/16/1945 ?Today's Date: 11/22/2021 ? ? ?History of Present Illness Kim [name] is a 76 y.o. female with medical history significant of SLE with rheumatoid arthritis, recent left cerebral artery embolic CVA hospitalized both in December 2022 and once again in January 2023, hypertension, heart failure with preserved EF/diastolic dysfunction and dyslipidemia, presented to the ER with complaints of constipation and rectal pain. Admitted with dx: AKI and is now s/p flexible sigmoidoscopy on 11/21/21.  ? ?Clinical Impression ?  ? Pt admitted as above, presenting with deficits as listed below (see OT problem list). Pt lives with family on main level of 2 story home. Family provides Min A for ADL's at baseline, daughter works during the day. Pt and her daughter were educated in role of OT. Discussed use of 3:1 at home with pt/daughter so it could be close to her bed at night but over her toilet during the day. Overall, she appears to be close to her baseline level of Min assist for ADLs and Min guard functional mobility using a RW, but should benefit from acute OT to assist with maximizing independence with ADL's and functional trasnfers prior to anticipated d/c home with PRN/intermittent assist from family during the day. ?   ? ?Recommendations for follow up therapy are one component of a multi-disciplinary discharge planning process, led by the attending physician.  Recommendations may be updated based on patient status, additional functional criteria and insurance authorization.  ? ?Follow Up Recommendations ? No OT follow up  ?  ?Assistance Recommended at Discharge Intermittent Supervision/Assistance  ?Patient can return home with the following A little help with bathing/dressing/bathroom;Assistance with cooking/housework;A little help with walking and/or transfers ? ?  ?Functional Status Assessment ? Patient has had a recent  decline in their functional status and demonstrates the ability to make significant improvements in function in a reasonable and predictable amount of time.  ?Equipment Recommendations ? BSC/3in1  ?  ?Recommendations for Other Services   ? ? ?  ?Precautions / Restrictions Precautions ?Precautions: Fall  ? ?  ? ?Mobility Bed Mobility ?  ?  ?General bed mobility comments: Pt up in chair upon OT arrival ?  ? ?Transfers ?Overall transfer level: Needs assistance ?Equipment used: Rolling walker (2 wheels) ?Transfers: Sit to/from Stand ?Sit to Stand: Min guard, Min assist ?  ?  ?General transfer comment: Pt was Min A initial sit to stand from toilet/chair and min guard assist during functional moblity in room and back to chair using RW. Pt apepars to be close to baselien level. ?  ? ?  ?Balance Overall balance assessment: Needs assistance ?Sitting-balance support: No upper extremity supported, Feet supported ?Sitting balance-Leahy Scale: Good ?  ?  ?Standing balance support: Bilateral upper extremity supported, Reliant on assistive device for balance ?Standing balance-Leahy Scale: Fair ?   ? ?ADL either performed or assessed with clinical judgement  ? ?ADL Overall ADL's : Needs assistance/impaired ?Eating/Feeding: Set up;Sitting;Modified independent ?  ?Grooming: Set up;Sitting ?  ?Upper Body Bathing: Minimal assistance;Sitting ?  ?Lower Body Bathing: Minimal assistance;Sit to/from stand;Sitting/lateral leans ?  ?Upper Body Dressing : Set up;Sitting;Min guard ?  ?Lower Body Dressing: Minimal assistance;Sit to/from stand;Sitting/lateral leans ?  ?Toilet Transfer: Min guard;Ambulation;Regular Toilet;Grab bars ?  ?Toileting- Clothing Manipulation and Hygiene: Minimal assistance;With caregiver independent assisting;Sit to/from stand;Sitting/lateral lean ?  ?  ?Functional mobility during ADLs: Min guard;Rolling walker (2 wheels) ?General ADL Comments: Pt is min guard assist for functional  moblity and Min A LB ADL's. She and her  daughter were educated in role of OT. She was seen over two brief sessions today, once earlier for past medicatal history, eating and basic grooming, then later to complete eval to assess functional mobility/transfers in her room/bathroom. Discussed use of 3:1 at home with pt/daughter so it could be close to her bed at night but over her toilet during the day. Overall, she appears to be close to her baseline level but should benefit from acute OT to assist with maximizing independence with ADL's and functional trasnfers prior to anticipated d/c home with PRN/intermittent assist from family during the day.  ? ? ? ?Vision Patient Visual Report: No change from baseline ?   ?   ?   ?   ? ?Pertinent Vitals/Pain Pain Assessment ?Pain Assessment: Faces ?Faces Pain Scale: Hurts little more ?Pain Location: Pt w/ c/o pain with BM and when catheter was removed by RN ?Pain Descriptors / Indicators: Guarding, Aching, Discomfort ?Pain Intervention(s): Limited activity within patient's tolerance, Monitored during session  ? ? ? ?Hand Dominance Right ?  ?Extremity/Trunk Assessment Upper Extremity Assessment ?Upper Extremity Assessment: Overall WFL for tasks assessed;Generalized weakness ?  ?Lower Extremity Assessment ?Lower Extremity Assessment: Defer to PT evaluation ?  ?  ?  ?Communication Communication ?Communication: Prefers language other than English (Daughter in room to ttranslate) ?  ?Cognition Arousal/Alertness: Awake/alert ?Behavior During Therapy: [facility] for tasks assessed/performed ?Overall Cognitive Status: Difficult to assess ?  ?  ?  ?General Comments: Per pt's daughter, pt has lived with family/sister and her husband since CVA in Dec. Pt daughter reports that sister assists with all ADL's and self care tasks. ?  ?  ?   ?   ?   ? ? ?Home Living Family/patient expects to be discharged to:: Private residence ?Living Arrangements: Children ?Available Help at Discharge: Family;Available PRN/intermittently (Lives with  daughter whom works during the day. Family checks on her PRN) ?Type of Home: House ?Home Access: Stairs to enter ?Entrance Stairs-Number of Steps: 3 ?Entrance Stairs-Rails: Right;Left ?Home Layout: Two level;Able to live on main level with bedroom/bathroom ?Alternate Level Stairs-Number of Steps: Lives on first floor of daughters house with walk in shower with built in bench. ?  ?Bathroom Shower/Tub: Walk-in shower ?  ?Bathroom Toilet: Standard ?  ?  ?Home Equipment: Agricultural consultant (2 wheels);Hand held shower head;Shower seat - built in ?  ?Additional Comments: pt's daughter works during the day. Husband also checks on pt ?  ? ?  ?Prior Functioning/Environment Prior Level of Function : Needs assist ?  ?  ?  ?Physical Assist : Mobility (physical) ?Mobility (physical): Transfers ?  ?Mobility Comments: uses RW at home since leaving rehab. Pt daughter reports that she needs assist with initial sit to stand from bed ?ADLs Comments: Daughter assists with bathing, has walk in shower w/ built in seat, daughter assists with meal prep and all adl's ?  ? ?  ?  ?OT Problem List: Decreased knowledge of use of DME or AE;Decreased activity tolerance;Impaired balance (sitting and/or standing);Pain ?  ?   ?OT Treatment/Interventions: Self-care/ADL training;Patient/family education;Energy conservation;Therapeutic activities;DME and/or AE instruction  ?  ?OT Goals(Current goals can be found in the care plan section) Acute Rehab OT Goals ?Patient Stated Goal: None stated. Per daughter to go home with family PRN assist and HHPT ?OT Goal Formulation: With patient/family ?Time For Goal Achievement: 12/06/21 ?Potential to Achieve Goals: Good  ?OT Frequency: Min 2X/week ?  ? ?AM-PAC  OT "6 Clicks" Daily Activity     ?Outcome Measure Help from another person eating meals?: None ?Help from another person taking care of personal grooming?: A Little ?Help from another person toileting, which includes using toliet, bedpan, or urinal?: A  Little ?Help from another person bathing (including washing, rinsing, drying)?: A Little ?Help from another person to put on and taking off regular upper body clothing?: None ?Help from another person to put on and t

## 2021-11-22 NOTE — Evaluation (Signed)
Physical Therapy Evaluation ?Patient Details ?Name: Kim [name] ?MRN: [id number] ?DOB: 1946-04-02 ?Today's Date: 11/22/2021 ? ?History of Present Illness ? Kim [name] is a 76 y.o. female with medical history significant of SLE with rheumatoid arthritis, recent left cerebral artery embolic CVA hospitalized both in December 2022 and once again in January 2023, hypertension, heart failure with preserved EF/diastolic dysfunction and dyslipidemia, presented to the ER with complaints of constipation and rectal pain. Admitted with dx: AKI and is now s/p flexible sigmoidoscopy on 11/21/21.  ?Clinical Impression ? Pt admitted with above diagnosis. Pt was able to ambulate in hallwy with RW with good safety awareness. Pt is most likely close to baseline. Daughters are working out caregivers for pt.  Pt currently with functional limitations due to the deficits listed below (see PT Problem List). Pt will benefit from skilled PT to increase their independence and safety with mobility to allow discharge to the venue listed below.      ?   ? ?Recommendations for follow up therapy are one component of a multi-disciplinary discharge planning process, led by the attending physician.  Recommendations may be updated based on patient status, additional functional criteria and insurance authorization. ? ?Follow Up Recommendations Home health PT ? ?  ?Assistance Recommended at Discharge Set up Supervision/Assistance  ?Patient can return home with the following ? A little help with walking and/or transfers;A little help with bathing/dressing/bathroom;Help with stairs or ramp for entrance ? ?  ?Equipment Recommendations BSC/3in1;Wheelchair (18x16 lightweight wheelchair with desk armrests, foot rests and anti tippers);Wheelchair cushion (18x16 pressure relieving cushion)  ?Recommendations for Other Services ?    ?  ?Functional Status Assessment Patient has had a recent decline in their functional status and demonstrates the ability to make  significant improvements in function in a reasonable and predictable amount of time.  ? ?  ?Precautions / Restrictions Precautions ?Precautions: Fall ?Restrictions ?Weight Bearing Restrictions: No  ? ?  ? ?Mobility ? Bed Mobility ?  ?  ?  ?  ?  ?  ?  ?General bed mobility comments: Pt up in chair upon OT arrival ?  ? ?Transfers ?Overall transfer level: Needs assistance ?Equipment used: Rolling walker (2 wheels) ?Transfers: Sit to/from Stand ?Sit to Stand: Min guard, Min assist ?  ?  ?  ?  ?  ?General transfer comment: Pt was Min guard  A initial sit to stand from chair using RW. Pt appears to be close to baseline level. ?  ? ?Ambulation/Gait ?Ambulation/Gait assistance: Min guard ?Gait Distance (Feet): 450 Feet ?Assistive device: Rolling walker (2 wheels) ?Gait Pattern/deviations: Step-through pattern, Decreased stride length ?  ?Gait velocity interpretation: <1.31 ft/sec, indicative of household ambulator ?  ?General Gait Details: Pt with good balance overall. No LOB and good safety awareness. ? ?Stairs ?  ?  ?  ?  ?  ? ?Wheelchair Mobility ?  ? ?Modified Rankin (Stroke Patients Only) ?  ? ?  ? ?Balance Overall balance assessment: Needs assistance ?Sitting-balance support: No upper extremity supported, Feet supported ?Sitting balance-Leahy Scale: Good ?  ?  ?Standing balance support: Bilateral upper extremity supported, Reliant on assistive device for balance ?Standing balance-Leahy Scale: Fair ?Standing balance comment: can stand statically without RW but needs RW for dynamic tasks. ?  ?  ?  ?  ?  ?  ?  ?  ?  ?  ?  ?   ? ? ? ?Pertinent Vitals/Pain Pain Assessment ?Pain Assessment: No/denies pain  ? ? ?Home Living Family/patient expects to  be discharged to:: Private residence ?Living Arrangements: Children ?Available Help at Discharge: Family;Available PRN/intermittently (Lives with daughter whom works during the day. Family checks on her PRN) ?Type of Home: House ?Home Access: Stairs to enter ?Entrance  Stairs-Rails: Right;Left ?Entrance Stairs-Number of Steps: 3 ?Alternate Level Stairs-Number of Steps: Lives on first floor of daughters house with walk in shower with built in bench. ?Home Layout: Two level;Able to live on main level with bedroom/bathroom ?Home Equipment: Conservation officer, nature (2 wheels);Hand held shower head;Shower seat - built in ?Additional Comments: pt's daughter works during the day. Husband also checks on pt  ?  ?Prior Function Prior Level of Function : Needs assist ?  ?  ?  ?Physical Assist : Mobility (physical) ?Mobility (physical): Transfers ?  ?Mobility Comments: uses RW at home since leaving rehab. Pt daughter reports that she needs assist with initial sit to stand from bed ?ADLs Comments: Daughter assists with bathing, has walk in shower w/ built in seat, daughter assists with meal prep and all adl's ?  ? ? ?Hand Dominance  ? Dominant Hand: Right ? ?  ?Extremity/Trunk Assessment  ? Upper Extremity Assessment ?Upper Extremity Assessment: Defer to OT evaluation ?  ? ?Lower Extremity Assessment ?Lower Extremity Assessment: Generalized weakness ?  ? ?Cervical / Trunk Assessment ?Cervical / Trunk Assessment: Normal  ?Communication  ? Communication: Prefers language other than English (Daughter in room to translate-Punjabi)  ?Cognition Arousal/Alertness: Awake/alert ?Behavior During Therapy: Kim [name] for tasks assessed/performed ?Overall Cognitive Status: Difficult to assess ?  ?  ?  ?  ?  ?  ?  ?  ?  ?  ?  ?  ?  ?  ?  ?  ?General Comments: Per pt's daughter, pt has lived with family/sister and her husband since CVA in Dec. Pt daughter reports that sister assists with all ADL's and self [name] tasks. ?  ?  ? ?  ?General Comments   ? ?  ?Exercises    ? ?Assessment/Plan  ?  ?PT Assessment Patient needs continued PT services  ?PT Problem List Decreased balance;Decreased activity tolerance;Decreased mobility;Decreased knowledge of use of DME;Decreased safety awareness;Decreased knowledge of precautions ? ?   ?   ?PT Treatment Interventions DME instruction;Gait training;Functional mobility training;Therapeutic activities;Therapeutic exercise;Balance training;Patient/family education;Stair training   ? ?PT Goals (Current goals can be found in the [name] Plan section)  ?Acute Rehab PT Goals ?Patient Stated Goal: to go home ?PT Goal Formulation: With patient ?Time For Goal Achievement: 12/06/21 ?Potential to Achieve Goals: Good ? ?  ?Frequency Min 3X/week ?  ? ? ?Co-evaluation   ?  ?  ?  ?  ? ? ?  ?AM-PAC PT "6 Clicks" Mobility  ?Outcome Measure Help needed turning from your back to your side while in a flat bed without using bedrails?: None ?Help needed moving from lying on your back to sitting on the side of a flat bed without using bedrails?: None ?Help needed moving to and from a bed to a chair (including a wheelchair)?: A Little ?Help needed standing up from a chair using your arms (e.g., wheelchair or bedside chair)?: A Little ?Help needed to walk in hospital room?: A Little ?Help needed climbing 3-5 steps with a railing? : A Lot ?6 Click Score: 19 ? ?  ?End of Session Equipment Utilized During Treatment: Gait belt ?Activity Tolerance: Patient limited by fatigue ?Patient left: in chair;with call bell/phone within reach;with family/visitor present ?Nurse Communication: Mobility status ?PT Visit Diagnosis: Muscle weakness (generalized) (M62.81) ?  ? ?  Time: [id number] ?PT Time Calculation (min) (ACUTE ONLY): 14 min ? ? ?Charges:   PT Evaluation ?$PT Eval Moderate Complexity: 1 Mod ?  ?  ?   ? ? ?Kim [name],PT ?Acute Rehab Services ?[phone] ?[phone] (pager)  ? ?Kim [name] ?11/22/2021, 12:34 PM ? ?

## 2021-11-22 NOTE — Care Management Important Message (Signed)
Important Message ? ?Patient Details  ?Name: Kim [name] ?MRN: [id number] ?Date of Birth: 09-02-1945 ? ? ?Medicare Important Message Given:  Yes ? ? ? ? ?Mardene Sayer ?11/22/2021, 2:18 PM ?

## 2021-11-22 NOTE — Discharge Instructions (Signed)

## 2021-11-22 NOTE — Progress Notes (Signed)
?PROGRESS NOTE ? ? ? ?Eldana [facility] Care  [id number] DOB: 1945-09-01 DOA: 11/18/2021 ?PCP: Curt Jews, PA-C ? ? ?Brief Narrative:  ?Kim [name] is a 76 y.o. female with medical history significant of SLE with rheumatoid arthritis, recent left cerebral artery embolic CVA hospitalized both in December 2022 and once again in January 2023, hypertension, heart failure with preserved EF/diastolic dysfunction and dyslipidemia, presented to the ER with complaints of constipation and rectal pain.  This has been ongoing since her recent stroke.  Has been using Dulcolax suppositories without improvement in symptoms.  Now having leakage of watery stool.  No bleeding noted.  Had implantable cardiac monitor in place due to recent stroke and was recently diagnosed with atrial fibrillation and started on Eliquis on 5/2.  Because of this Plavix was discontinued but aspirin was recommended to remain in place.  Labs obtained in the ER revealed acute kidney injury with a BUN of 55 and a creatinine of 4.33 with low sodium of 132 and CO2 of 18 consistent with contraction alkalosis/dehydration.  Baseline renal function 09/17/2021 was BUN of 14 with a creatinine of 0.70.  Abdomen CT in the ER revealed large volume of stool within the rectal vault with superimposed inflammatory changes suggesting fecal impaction with associated stercoral proctitis and a moderate rectal prolapse.  She was also noted with bladder distention with a volume of 786 cc so a Foley catheter was placed.  She was given 500 cc fluid bolus and hospitalist were asked to admit patient. ?  ?Assessment & Plan: ?  ?Principal Problem: ?  AKI (acute kidney injury) ([place]) ?Active Problems: ?  HTN (hypertension) ?  Mixed hyperlipidemia ?  Restless leg syndrome ?  Systemic lupus erythematosus ([place]) ?  (HFpEF) heart failure with preserved ejection fraction ([place]) ?  Anemia ?  Constipation ?  Stercoral ulcer of anus ?  Dehydration ?  Atrial fibrillation, chronic ([place]) ?  History of  cardioembolic cerebrovascular accident (CVA) ?  Acute kidney injury ([place]) ?  Fecal impaction ([place]) ?  Gastrointestinal hemorrhage ? ?Acute kidney injury/metabolic acidosis: Presented with creatinine of 4.3 which is improving but still lower than expected, currently creatinine at 3.16 today.  We will transition from LR to normal saline increase the frequency to 150 cc/h.  CO2 improving.   ?  ?Rectal impaction with stercoral proctitis/focal constipation ?Partially disimpacted by EDP with some blood noted during disimpaction.  Seen by GI.  S/p flexible sigmoidoscopy on 11/21/2021 which showed stercoral distal rectal ulcer without visible vessel or current bleeding.  Otherwise normal.  GI recommended avoiding constipation with daily MiraLAX. ? ?Acute blood loss anemia/possible rectal bleeding: Her hemoglobin dropped to less than 7 on 11/19/2021, she received 2 unit of PRBC transfusion.  Hemoglobin has remained stable after that. ?  ?Acute urinary retention/gross hematuria: Likely due to severe constipation.  Hematuria improving.  Now that patient is feeling better, she will be seen by PT OT, I will discontinue Foley catheter.  Good chance of successful voiding trial today. ? ?Paroxysmal atrial fibrillation: Due to anemia, Eliquis was held.  She is not on any rate control medications, rates hovering around 55.  I have resumed Eliquis today per GI recommendations. ?  ?Recent left cerebellar artery embolic CVA December [id number]: Due to possible GI bleed, aspirin and Eliquis both were held.  Resuming Eliquis today but holding aspirin. ?  ?Failure to thrive in adult ?Previously had been on Megace without any response to this medication so it was discontinued according to daughter ?  Patient encouraged to drink more fluids as opposed to eating significant volumes of food.  ?  ?Hypertension/DD1 w/ HFprEF on echo December 2022: Due to AKI, Cozaar was held and due to low blood pressure, hydralazine was held.  Hydralazine was resumed at  3 times daily instead of twice daily, blood pressure still elevated, will increase hydralazine to 100 mg 3 times daily.  Continue to hold Cozaar. ?  ?SLE/RA ?Continue Plaquenil ?Given constipation and nausea we will hold p.m. Ultram for now but can reassess ? ?DVT prophylaxis: apixaban (ELIQUIS) tablet 2.5 mg Start: 11/22/21 1000 ?SCDs Start: 11/19/21 0743 ?  Code Status: Full Code  ?Family Communication: Daughter Manpreet at bedside.  Discussed in length. ? ?Status is: Inpatient ?Remains inpatient appropriate because: Creatinine is still elevated.  Needs to be assessed by PT OT.  Potential discharge tomorrow. ? ? ?Estimated body mass index is 21.83 kg/m? as calculated from the following: ?  Height as of this encounter: 5' (1.524 m). ?  Weight as of this encounter: 50.7 kg. ? ?  ?Nutritional Assessment: ?Body mass index is 21.83 kg/m?Marland KitchenMarland Kitchen ?Seen by dietician.  I agree with the assessment and plan as outlined below: ?Nutrition Status: ?Nutrition Problem: Inadequate oral intake ?Etiology: poor appetite ?Signs/Symptoms: per patient/family report ?Interventions: Ensure Enlive (each supplement provides 350kcal and 20 grams of protein), MVI, Refer to RD note for recommendations ? ?. ?Skin Assessment: ?I have examined the patient's skin and I agree with the wound assessment as performed by the wound care RN as outlined below: ?  ? ?Consultants:  ?GI ? ?Procedures:  ?None ? ?Antimicrobials:  ?Anti-infectives (From admission, onward)  ? ? Start     Dose/Rate Route Frequency Ordered Stop  ? 11/19/21 1000  hydroxychloroquine (PLAQUENIL) tablet 200 mg       ? 200 mg Oral Daily 11/19/21 0750    ? ?  ?  ? ? ?Subjective: ? ?Patient seen and examined.  She still complains of rectal pain with defecation but it is improving.  Daughter at the bedside.  She has no other complaint.  Patient was encouraged to eat and drink more. ? ?Objective: ?Vitals:  ? 11/21/21 2058 11/22/21 0105 11/22/21 0604 11/22/21 0936  ?BP: (!) 160/85 (!) 166/80 (!)  185/89 (!) 188/83  ?Pulse: (!) 38 (!) 58 66 65  ?Resp: 18 18 18 16   ?Temp:  97.8 ?F (36.6 ?C) 98.4 ?F (36.9 ?C) 98.5 ?F (36.9 ?C)  ?TempSrc:    Oral  ?SpO2: 97% 98% 95% 96%  ?Weight:      ?Height:      ? ? ?Intake/Output Summary (Last 24 hours) at 11/22/2021 1007 ?Last data filed at 11/22/2021 0049 ?Gross per 24 hour  ?Intake 440 ml  ?Output 1800 ml  ?Net -1360 ml  ? ? ?Filed Weights  ? 11/18/21 1928 11/19/21 0459  ?Weight: 50.3 kg 50.7 kg  ? ? ?Examination: ? ?General exam: Appears calm and comfortable, appears cachectic ?Respiratory system: Clear to auscultation. Respiratory effort normal. ?Cardiovascular system: S1 & S2 heard, RRR. No JVD, murmurs, rubs, gallops or clicks. No pedal edema. ?Gastrointestinal system: Abdomen is nondistended, soft and nontender. No organomegaly or masses felt. Normal bowel sounds heard. ?Central nervous system: Alert and oriented. No focal neurological deficits. ?Extremities: Symmetric 5 x 5 power. ?Skin: No rashes, lesions or ulcers.  ?Psychiatry: Judgement and insight appear normal. Mood & affect appropriate.  ? ?Data Reviewed: I have personally reviewed following labs and imaging studies ? ?CBC: ?Recent Labs  ?Lab 11/18/21 ?  2026 11/19/21 ?[id number] 11/20/21 ?0930 11/20/21 ?1631 11/21/21 ?0706 11/22/21 ?0444  ?WBC 7.8 6.5 7.2 6.5 6.3 6.3  ?NEUTROABS 6.0 4.6  --   --   --   --   ?HGB 7.1* 6.3* 9.0* 9.2* 8.4* 8.9*  ?HCT 21.2* 19.6* 26.4* 27.8* 24.2* 25.9*  ?MCV 89.8 92.9 88.0 88.8 86.7 88.1  ?PLT 349 301 261 281 249 235  ? ? ?Basic Metabolic Panel: ?Recent Labs  ?Lab 11/18/21 ?2026 11/19/21 ?[id number] 11/20/21 ?0930 11/21/21 ?0706 11/22/21 ?0444  ?NA 132* 131* 133* 130* 132*  ?K 3.5 3.3* 3.6 3.1* 3.5  ?CL 104 105 106 104 105  ?CO2 18* 15* 15* 17* 19*  ?GLUCOSE 107* 168* 67* 90 90  ?BUN 55* 52* 48* 43* 37*  ?CREATININE 4.33* 4.27* 3.81* 3.48* 3.16*  ?CALCIUM 8.5* 8.1* 8.1* 7.7* 7.9*  ?MG  --  1.7  --   --   --   ?PHOS  --  4.4  --   --   --   ? ? ?GFR: ?Estimated Creatinine Clearance: 10.9 mL/min  (A) (by C-G formula based on SCr of 3.16 mg/dL (H)). ?Liver Function Tests: ?Recent Labs  ?Lab 11/18/21 ?2026 11/19/21 ?0926  ?AST 19 19  ?ALT 13 12  ?ALKPHOS 70 60  ?BILITOT 0.5 0.3  ?PROT 7.4 6.1

## 2021-11-23 DIAGNOSIS — N179 Acute kidney failure, unspecified: Secondary | ICD-10-CM | POA: Diagnosis not present

## 2021-11-23 LAB — BASIC METABOLIC PANEL
Anion gap: 10 (ref 5–15)
BUN: 29 mg/dL — ABNORMAL HIGH (ref 8–23)
CO2: 19 mmol/L — ABNORMAL LOW (ref 22–32)
Calcium: 7.7 mg/dL — ABNORMAL LOW (ref 8.9–10.3)
Chloride: 106 mmol/L (ref 98–111)
Creatinine, Ser: 2.99 mg/dL — ABNORMAL HIGH (ref 0.44–1.00)
GFR, Estimated: 16 mL/min — ABNORMAL LOW (ref >60)
Glucose, Bld: 97 mg/dL (ref 70–99)
Potassium: 2.9 mmol/L — ABNORMAL LOW (ref 3.5–5.1)
Sodium: 135 mmol/L (ref 135–145)

## 2021-11-23 LAB — MAGNESIUM: Magnesium: 1.5 mg/dL — ABNORMAL LOW (ref 1.7–2.4)

## 2021-11-23 MED ORDER — ASPIRIN EC 81 MG PO TBEC
81.0000 mg | DELAYED_RELEASE_TABLET | Freq: Every day | ORAL | Status: DC
  Filled 2021-11-23: qty 1

## 2021-11-23 MED ORDER — POTASSIUM CHLORIDE CRYS ER 20 MEQ PO TBCR
40.0000 meq | EXTENDED_RELEASE_TABLET | ORAL | Status: AC
  Administered 2021-11-23 (×3): 40 meq via ORAL
  Filled 2021-11-23 (×3): qty 2

## 2021-11-23 MED ORDER — MAGNESIUM SULFATE 2 GM/50ML IV SOLN
2.0000 g | Freq: Once | INTRAVENOUS | Status: AC
  Administered 2021-11-23: 2 g via INTRAVENOUS
  Filled 2021-11-23: qty 50

## 2021-11-23 MED ORDER — SENNOSIDES-DOCUSATE SODIUM 8.6-50 MG PO TABS
2.0000 | ORAL_TABLET | Freq: Every day | ORAL | Status: DC
  Administered 2021-11-23 – 2021-11-25 (×3): 2 via ORAL
  Filled 2021-11-23 (×4): qty 2

## 2021-11-23 MED ORDER — ISOSORBIDE MONONITRATE ER 30 MG PO TB24
30.0000 mg | ORAL_TABLET | Freq: Every day | ORAL | Status: DC
  Administered 2021-11-23 – 2021-11-26 (×4): 30 mg via ORAL
  Filled 2021-11-23 (×4): qty 1

## 2021-11-23 NOTE — Progress Notes (Addendum)
?PROGRESS NOTE ? ? ? ?Kim [name]  MKL:[id number] DOB: Feb 25, 1946 DOA: 11/18/2021 ?PCP: Judd Lien, PA-C ? ? ?Brief Narrative:  ?Kim [name] is a 76 y.o. female with medical history significant of SLE with rheumatoid arthritis, recent left cerebral artery embolic CVA hospitalized both in December 2022 and once again in January 2023, hypertension, heart failure with preserved EF/diastolic dysfunction and dyslipidemia, presented to the ER with complaints of constipation and rectal pain.  This has been ongoing since her recent stroke.  Has been using Dulcolax suppositories without improvement in symptoms.  No bleeding noted.  Had implantable cardiac monitor in place due to recent stroke and was recently diagnosed with atrial fibrillation and started on Eliquis on 5/2.  Because of this Plavix as well as aspirin was discontinued, daughter confirms.  Labs obtained in the ER revealed acute kidney injury with a BUN of 55 and a creatinine of 4.33 with low sodium of 132 and CO2 of 18 consistent with contraction alkalosis/dehydration.  Baseline renal function 09/17/2021 was BUN of 14 with a creatinine of 0.70.  Abdomen CT in the ER revealed large volume of stool within the rectal vault with superimposed inflammatory changes suggesting fecal impaction with associated stercoral proctitis and a moderate rectal prolapse.  She was also noted with bladder distention with a volume of 786 cc so a Foley catheter was placed.  She was given 500 cc fluid bolus and hospitalist were asked to admit patient.  Further details as below. ?  ?Assessment & Plan: ?  ?Principal Problem: ?  AKI (acute kidney injury) (HCC) ?Active Problems: ?  HTN (hypertension) ?  Mixed hyperlipidemia ?  Restless leg syndrome ?  Systemic lupus erythematosus (HCC) ?  (HFpEF) heart failure with preserved ejection fraction (HCC) ?  Anemia ?  Constipation ?  Stercoral ulcer of anus ?  Dehydration ?  Atrial fibrillation, chronic (HCC) ?  History of cardioembolic  cerebrovascular accident (CVA) ?  Acute kidney injury (HCC) ?  Fecal impaction (HCC) ?  Gastrointestinal hemorrhage ? ?Acute kidney injury/metabolic acidosis: Presented with creatinine of 4.3 which is improving but still lower than expected, currently creatinine at 2.99 today.  I had increased her fluid frequency yesterday to 150 cc/h however she was still getting 100 cc/h.  Discussed with RN.  Increasing the frequency.  CO2 stable. ?  ?Rectal impaction with stercoral proctitis/focal constipation ?Partially disimpacted by EDP with some blood noted during disimpaction.  Seen by GI.  S/p flexible sigmoidoscopy on 11/21/2021 which showed stercoral distal rectal ulcer without visible vessel or current bleeding.  Otherwise normal.  GI recommended avoiding constipation with daily MiraLAX.  She is on 3 times daily MiraLAX, and Colace and she is still complaining of constipation.  We will add Senokot. ? ?Hypokalemia/hypomagnesemia: Replace. ? ?Acute blood loss anemia/possible rectal bleeding: Her hemoglobin dropped to less than 7 on 11/19/2021, she received 2 unit of PRBC transfusion.  Hemoglobin has remained stable after that. ?  ?Acute urinary retention/gross hematuria: Likely due to severe constipation.  Hematuria improving.  Catheter was removed yesterday, she had successful voiding trial. ? ?Paroxysmal atrial fibrillation: Due to anemia, Eliquis was held.  She is not on any rate control medications, rates very well controlled, Eliquis resumed on 11/22/2021 per GI recommendation.  ?  ?Recent left cerebellar artery embolic CVA December 2022: Due to possible GI bleed, Eliquis was initially held, Eliquis resumed yesterday.  Daughter tells me that patient's Plavix as well as aspirin was discontinued about 10 days ago by her  cardiologist. ?  ?Failure to thrive in adult ?Previously had been on Megace without any response to this medication so it was discontinued according to daughter ?Patient encouraged to drink more fluids as  opposed to eating significant volumes of food.  ?  ?Hypertension/DD1 w/ HFprEF on echo December 2022: Due to AKI, Cozaar was held and due to low blood pressure, hydralazine was held.  Hydralazine was resumed at 3 times daily instead of twice daily, blood pressure still elevated, hydralazine further increased to 100 mg 3 times daily on 11/22/2021, blood pressure still elevated, but her daughter Manpreet who happens to be PA and she is at bedside, patient has failed to tolerate beta-blocker in the past and she easily gets swelling with amlodipine.  Cannot start on Cozaar due to recovering AKI, will start on Imdur.  Daughter in agreement. ?  ?SLE/RA ?Continue Plaquenil ? ?DVT prophylaxis: apixaban (ELIQUIS) tablet 2.5 mg Start: 11/22/21 1000 ?SCDs Start: 11/19/21 0743 ?  Code Status: Full Code  ?Family Communication: Daughter Manpreet at bedside.  Discussed in length. ? ?Status is: Inpatient ?Remains inpatient appropriate because: Creatinine is still elevated.  Needs to be assessed by PT OT.  Potential discharge tomorrow. ? ? ?Estimated body mass index is 24.28 kg/m? as calculated from the following: ?  Height as of this encounter: 5' (1.524 m). ?  Weight as of this encounter: 56.4 kg. ? ?  ?Nutritional Assessment: ?Body mass index is 24.28 kg/m?Marland KitchenMarland Kitchen ?Seen by dietician.  I agree with the assessment and plan as outlined below: ?Nutrition Status: ?Nutrition Problem: Inadequate oral intake ?Etiology: poor appetite ?Signs/Symptoms: per patient/family report ?Interventions: Ensure Enlive (each supplement provides 350kcal and 20 grams of protein), MVI, Refer to RD note for recommendations ? ?. ?Skin Assessment: ?I have examined the patient's skin and I agree with the wound assessment as performed by the wound care RN as outlined below: ?  ? ?Consultants:  ?GI ? ?Procedures:  ?None ? ?Antimicrobials:  ?Anti-infectives (From admission, onward)  ? ? Start     Dose/Rate Route Frequency Ordered Stop  ? 11/19/21 1000   hydroxychloroquine (PLAQUENIL) tablet 200 mg       ? 200 mg Oral Daily 11/19/21 0750    ? ?  ?  ? ? ?Subjective: ? ?Patient seen and examined.  Daughter at the bedside.  Patient still complains of constipation and rectal pain with defecation.  No new complaint. ? ?Objective: ?Vitals:  ? 11/22/21 2104 11/23/21 0500 11/23/21 0650 11/23/21 0900  ?BP: (!) 179/98  (!) 179/99 (!) 176/80  ?Pulse: 66  65 75  ?Resp: ?Temp: 98.2 ?F (36.8 ?C)  98.3 ?F (36.8 ?C) 98.5 ?F (36.9 ?C)  ?TempSrc: Oral  Oral Oral  ?SpO2: 98%  98% 99%  ?Weight:  56.4 kg    ?Height:      ? ? ?Intake/Output Summary (Last 24 hours) at 11/23/2021 1245 ?Last data filed at 11/23/2021 1023 ?Gross per 24 hour  ?Intake 2815.74 ml  ?Output 900 ml  ?Net 1915.74 ml  ? ? ?Filed Weights  ? 11/18/21 1928 11/19/21 0459 11/23/21 0500  ?Weight: 50.3 kg 50.7 kg 56.4 kg  ? ? ?Examination: ? ?General exam: Appears calm and comfortable  ?Respiratory system: Clear to auscultation. Respiratory effort normal. ?Cardiovascular system: S1 & S2 heard, RRR. No JVD, murmurs, rubs, gallops or clicks. No pedal edema. ?Gastrointestinal system: Abdomen is nondistended, soft and nontender. No organomegaly or masses felt. Normal bowel sounds heard. ?Central nervous system: Alert and  oriented. No focal neurological deficits. ?Extremities: Symmetric 5 x 5 power. ?Skin: No rashes, lesions or ulcers.  ?Psychiatry: Judgement and insight appear normal. Mood & affect appropriate.  ? ? ?Data Reviewed: I have personally reviewed following labs and imaging studies ? ?CBC: ?Recent Labs  ?Lab 11/18/21 ?2026 11/19/21 ?[id number] 11/20/21 ?0930 11/20/21 ?1631 11/21/21 ?0706 11/22/21 ?0444  ?WBC 7.8 6.5 7.2 6.5 6.3 6.3  ?NEUTROABS 6.0 4.6  --   --   --   --   ?HGB 7.1* 6.3* 9.0* 9.2* 8.4* 8.9*  ?HCT 21.2* 19.6* 26.4* 27.8* 24.2* 25.9*  ?MCV 89.8 92.9 88.0 88.8 86.7 88.1  ?PLT 349 301 261 281 249 235  ? ? ?Basic Metabolic Panel: ?Recent Labs  ?Lab 11/19/21 ?0926 11/20/21 ?0930 11/21/21 ?0706  11/22/21 ?0444 11/23/21 ?[id number]  ?NA 131* 133* 130* 132* 135  ?K 3.3* 3.6 3.1* 3.5 2.9*  ?CL 105 106 104 105 106  ?CO2 15* 15* 17* 19* 19*  ?GLUCOSE 168* 67* 90 90 97  ?BUN 52* 48* 43* 37* 29*  ?CREATININE 4.27* 3.81* 3.48* 3.16* 2

## 2021-11-23 NOTE — TOC Progression Note (Cosign Needed)
Transition of Care (TOC) - Progression Note  ? ? ?Patient Details  ?Name: [name] ?MRN: [id number] ?Date of Birth: Aug 05, 1945 ? ?Transition of Care (TOC) CM/SW Contact  ?Tom-Johnson, Renea Ee, RN ?Phone Number: ?11/23/2021, 3:17 PM ? ?Clinical Narrative:    ? ?Patient suffers from Weakness which impairs their ability to perform daily activities like bathing, dressing, and toileting in the home.  A cane or walker will not resolve  ?issue with performing activities of daily living. A wheelchair will allow patient to safely perform daily activities. Patient is not able to propel themselves in the home using a standard weight wheelchair due to general weakness. Patient can self propel in the lightweight wheelchair. Length of need 12 months . ? ?Expected Discharge Plan: [place] ?Barriers to Discharge: Continued Medical Work up ? ?Expected Discharge Plan and Services ?Expected Discharge Plan: [place] ?  ?Discharge Planning Services: CM Consult ?Post Acute Care Choice: Home Health ?Living arrangements for the past 2 months: [place] ?                ?DME Arranged: 3-N-1, Wheelchair manual, Walker rolling with seat ?DME Agency: [employer] ?  ?Time DME Agency Contacted: [phone] ?  ?HH Arranged: PT, OT, Nurse's Aide ?  ?Date HH Agency Contacted: 11/23/21 ?Time [place]: 1415 ?  ? ? ?Social Determinants of Health (SDOH) Interventions ?  ? ?Readmission Risk Interventions ?   ? View : No data to display.  ?  ?  ?  ? ? ?

## 2021-11-23 NOTE — TOC Initial Note (Signed)
Transition of Care (TOC) - Initial/Assessment Note  ? ? ?Patient Details  ?Name: Dariella Toy Care ?MRN: [id number] ?Date of Birth: [birth year] ? ?Transition of Care (TOC) CM/SW Contact:    ?Tom-Johnson, Renea Ee, RN ?Phone Number: ?11/23/2021, 2:31 PM ? ?Clinical Narrative:                 ? ?CM spoke with patient and daughter, Manpreet at bedside about needs for post hospital transition. Admitted for AKI. S/P Sigmoidoscopy on 05/14. ?From home with daughter, Paramjit who is care giver at home. Has a cane and walker at home. PT recommending BSC and wheelchair. Daughter requesting a Rollator. Order called in to [facility] states patient received a walker last December and insurance will not pay for wheelchair or rollator. Daughter notified and agreed to pay out of pocket. ? PT Recommended Home health PT. Patient active with [facility] ([facility]). Manpreet voiced her dissatisfaction with [facility] and requests to use another agency. Medicare.gov list given to [facility] and she chose [facility]. Referral made to [facility] with acceptance voiced. Info on AVS. ?CM will continue to follow with needs. ? ? ?Expected Discharge Plan: [place] ?Barriers to Discharge: Continued Medical Work up ? ? ?Patient Goals and CMS Choice ?Patient states their goals for this hospitalization and ongoing recovery are:: To return home ?CMS Medicare.gov Compare Post Acute Care list provided to:: Patient ?Choice offered to / list presented to : Patient, Adult Children (Daughter, Manpreet) ? ?Expected Discharge Plan and Services ?Expected Discharge Plan: [place] ?  ?Discharge Planning Services: CM Consult ?Post Acute Care Choice: Home Health ?Living arrangements for the past 2 months: [place] ?                ?DME Arranged: 3-N-1, Wheelchair manual, Walker rolling with seat ?DME Agency: [employer] ?  ?Time DME Agency Contacted: [phone] ?  ?HH Arranged: PT, OT, Nurse's Aide ?  ?Date HH  Agency Contacted: 11/23/21 ?Time [place]: 1415 ?  ? ?Prior Living Arrangements/Services ?Living arrangements for the past 2 months: [place] ?Lives with:: Adult Children (Daughter, Paramjit.) ?Patient language and need for interpreter reviewed:: Yes ?Do you feel safe going back to the place where you live?: Yes      ?Need for Family Participation in Patient Care: Yes (Comment) ?Care giver support system in place?: Yes (comment) ?Current home services: DME (Cane, walker) ?Criminal Activity/Legal Involvement Pertinent to Current Situation/Hospitalization: No - Comment as needed ? ?Activities of Daily Living ?Home Assistive Devices/Equipment: Dentures (specify type), Shower chair with back, Walker (specify type), Cane (specify quad or straight) ?ADL Screening (condition at time of admission) ?Patient's cognitive ability adequate to safely complete daily activities?: No ?Is the patient deaf or have difficulty hearing?: No ?Does the patient have difficulty concentrating, remembering, or making decisions?: No ?Patient able to express need for assistance with ADLs?: Yes ?Does the patient have difficulty dressing or bathing?: Yes ?Independently performs ADLs?: No ?Communication: Independent ?Dressing (OT): Needs assistance ?Grooming: Needs assistance ?Feeding: Needs assistance ?Bathing: Needs assistance ?Toileting: Needs assistance ?In/Out Bed: Needs assistance ?Walks in Home: Independent with device (comment) (walker) ?Does the patient have difficulty walking or climbing stairs?: No ?Weakness of Legs: Both ?Weakness of Arms/Hands: Both ? ?Permission Sought/Granted ?Permission sought to share information with : Case Manager, Customer service manager, Family Supports ?Permission granted to share information with : Yes, Verbal Permission Granted ?   ?   ?   ?   ? ?  Emotional Assessment ?Appearance:: Appears stated age ?Attitude/Demeanor/Rapport: Engaged, Gracious ?Affect (typically observed):  Accepting, Appropriate, Calm, Hopeful ?Orientation: : Oriented to Self, Oriented to Place, Oriented to Situation ?Alcohol / Substance Use: Not Applicable ?Psych Involvement: No (comment) ? ?Admission diagnosis:  Urinary retention [R33.9] ?Fecal impaction ([place]) [K56.41] ?AKI (acute kidney injury) ([place]) [N17.9] ?Acute renal failure, unspecified acute renal failure type ([place]) [N17.9] ?Gastrointestinal hemorrhage, unspecified gastrointestinal hemorrhage type [K92.2] ?Anemia, unspecified type [D64.9] ?Acute kidney injury ([place]) [N17.9] ?Patient Active Problem List  ? Diagnosis Date Noted  ? Fecal impaction ([place])   ? Gastrointestinal hemorrhage   ? (HFpEF) heart failure with preserved ejection fraction ([place]) 11/19/2021  ? Anemia 11/19/2021  ? Constipation 11/19/2021  ? 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 Dehydration 11/19/2021  ? Atrial fibrillation, chronic ([place]) 11/19/2021  ? History of cardioembolic cerebrovascular accident (CVA) 11/19/2021  ? Acute kidney injury ([place]) 11/19/2021  ? AKI (acute kidney injury) ([place]) 11/18/2021  ? TIA (transient ischemic attack) 07/30/2021  ? Acute stroke due to occlusion of left cerebellar artery ([place]) 06/28/2021  ? Restless leg syndrome 06/28/2021  ? HTN (hypertension) 06/28/2021  ? Mixed hyperlipidemia 06/28/2021  ? Abnormal computed tomography angiography (CTA) 06/28/2021  ? Systemic lupus erythematosus ([place]) 06/28/2021  ? Hyperglycemia 06/27/2021  ? ?PCP:  Curt Jews, PA-C ?Pharmacy:   ?[facility] ?[address] ?[place] [address] ?Phone: [phone] Fax: [phone] ? ?[id number] - [place], [place] - [address] ?[address] ?[place] [address] ?Phone: [phone] Fax: [phone] ? ? ? ? ?Social Determinants of Health (SDOH) Interventions ?  ? ?Readmission Risk Interventions ?   ? View : No data to display.  ?  ?  ?  ? ? ? ?

## 2021-11-23 NOTE — Progress Notes (Signed)
Mobility Specialist: Progr ? 11/23/21 1405  ?Mobility  ?Activity Ambulated with assistance in hallway  ?Level of Assistance Modified independent, requires aide device or extra time  ?Assistive Device Front wheel walker  ?Distance Ambulated (ft) 330 ft  ?Activity Response Tolerated well  ?$Mobility charge 1 Mobility  ? ?Received pt in chair having no complaints and agreeable to mobility. Asymptomatic throughout ambulation, returned back to chair w/ call bell in reach and all needs met. ? ?Harrell Gave Blayke Cordrey ?Mobility Specialist ?Mobility Specialist [place]: [phone] ?Mobility Specialist [place]: [phone] ? ?

## 2021-11-24 ENCOUNTER — Inpatient Hospital Stay (HOSPITAL_COMMUNITY): Payer: Medicare Other

## 2021-11-24 DIAGNOSIS — N179 Acute kidney failure, unspecified: Secondary | ICD-10-CM | POA: Diagnosis not present

## 2021-11-24 LAB — CBC WITH DIFFERENTIAL/PLATELET
Abs Immature Granulocytes: 0.07 10*3/uL (ref 0.00–0.07)
Basophils Absolute: 0 10*3/uL (ref 0.0–0.1)
Basophils Relative: 0 %
Eosinophils Absolute: 0.2 10*3/uL (ref 0.0–0.5)
Eosinophils Relative: 1 %
HCT: 24.2 % — ABNORMAL LOW (ref 36.0–46.0)
Hemoglobin: 8.1 g/dL — ABNORMAL LOW (ref 12.0–15.0)
Immature Granulocytes: 1 %
Lymphocytes Relative: 3 %
Lymphs Abs: 0.3 10*3/uL — ABNORMAL LOW (ref 0.7–4.0)
MCH: 30.1 pg (ref 26.0–34.0)
MCHC: 33.5 g/dL (ref 30.0–36.0)
MCV: 90 fL (ref 80.0–100.0)
Monocytes Absolute: 0.8 10*3/uL (ref 0.1–1.0)
Monocytes Relative: 8 %
Neutro Abs: 9 10*3/uL — ABNORMAL HIGH (ref 1.7–7.7)
Neutrophils Relative %: 87 %
Platelets: 240 10*3/uL (ref 150–400)
RBC: 2.69 MIL/uL — ABNORMAL LOW (ref 3.87–5.11)
RDW: 16.4 % — ABNORMAL HIGH (ref 11.5–15.5)
WBC: 10.4 10*3/uL (ref 4.0–10.5)
nRBC: 0 % (ref 0.0–0.2)

## 2021-11-24 LAB — BASIC METABOLIC PANEL
Anion gap: 10 (ref 5–15)
BUN: 27 mg/dL — ABNORMAL HIGH (ref 8–23)
CO2: 15 mmol/L — ABNORMAL LOW (ref 22–32)
Calcium: 7.6 mg/dL — ABNORMAL LOW (ref 8.9–10.3)
Chloride: 111 mmol/L (ref 98–111)
Creatinine, Ser: 2.83 mg/dL — ABNORMAL HIGH (ref 0.44–1.00)
GFR, Estimated: 17 mL/min — ABNORMAL LOW (ref >60)
Glucose, Bld: 112 mg/dL — ABNORMAL HIGH (ref 70–99)
Potassium: 3.5 mmol/L (ref 3.5–5.1)
Sodium: 136 mmol/L (ref 135–145)

## 2021-11-24 LAB — URINALYSIS, ROUTINE W REFLEX MICROSCOPIC
Bilirubin Urine: NEGATIVE
Glucose, UA: 50 mg/dL — AB
Ketones, ur: NEGATIVE mg/dL
Nitrite: NEGATIVE
Protein, ur: 100 mg/dL — AB
RBC / HPF: >50 RBC/hpf — ABNORMAL HIGH (ref 0–5)
Specific Gravity, Urine: 1.009 (ref 1.005–1.030)
WBC, UA: >50 WBC/hpf — ABNORMAL HIGH (ref 0–5)
pH: 6 (ref 5.0–8.0)

## 2021-11-24 LAB — MAGNESIUM: Magnesium: 2.1 mg/dL (ref 1.7–2.4)

## 2021-11-24 IMAGING — DX DG ABDOMEN 1V
1 series · 1 of 1 positions shown · non-contrast
Comparison: Radiographs [DATE].  CT [DATE].

CLINICAL DATA: Abdominal pain and constipation.

EXAM:
ABDOMEN - 1 VIEW

[abdomen kub]
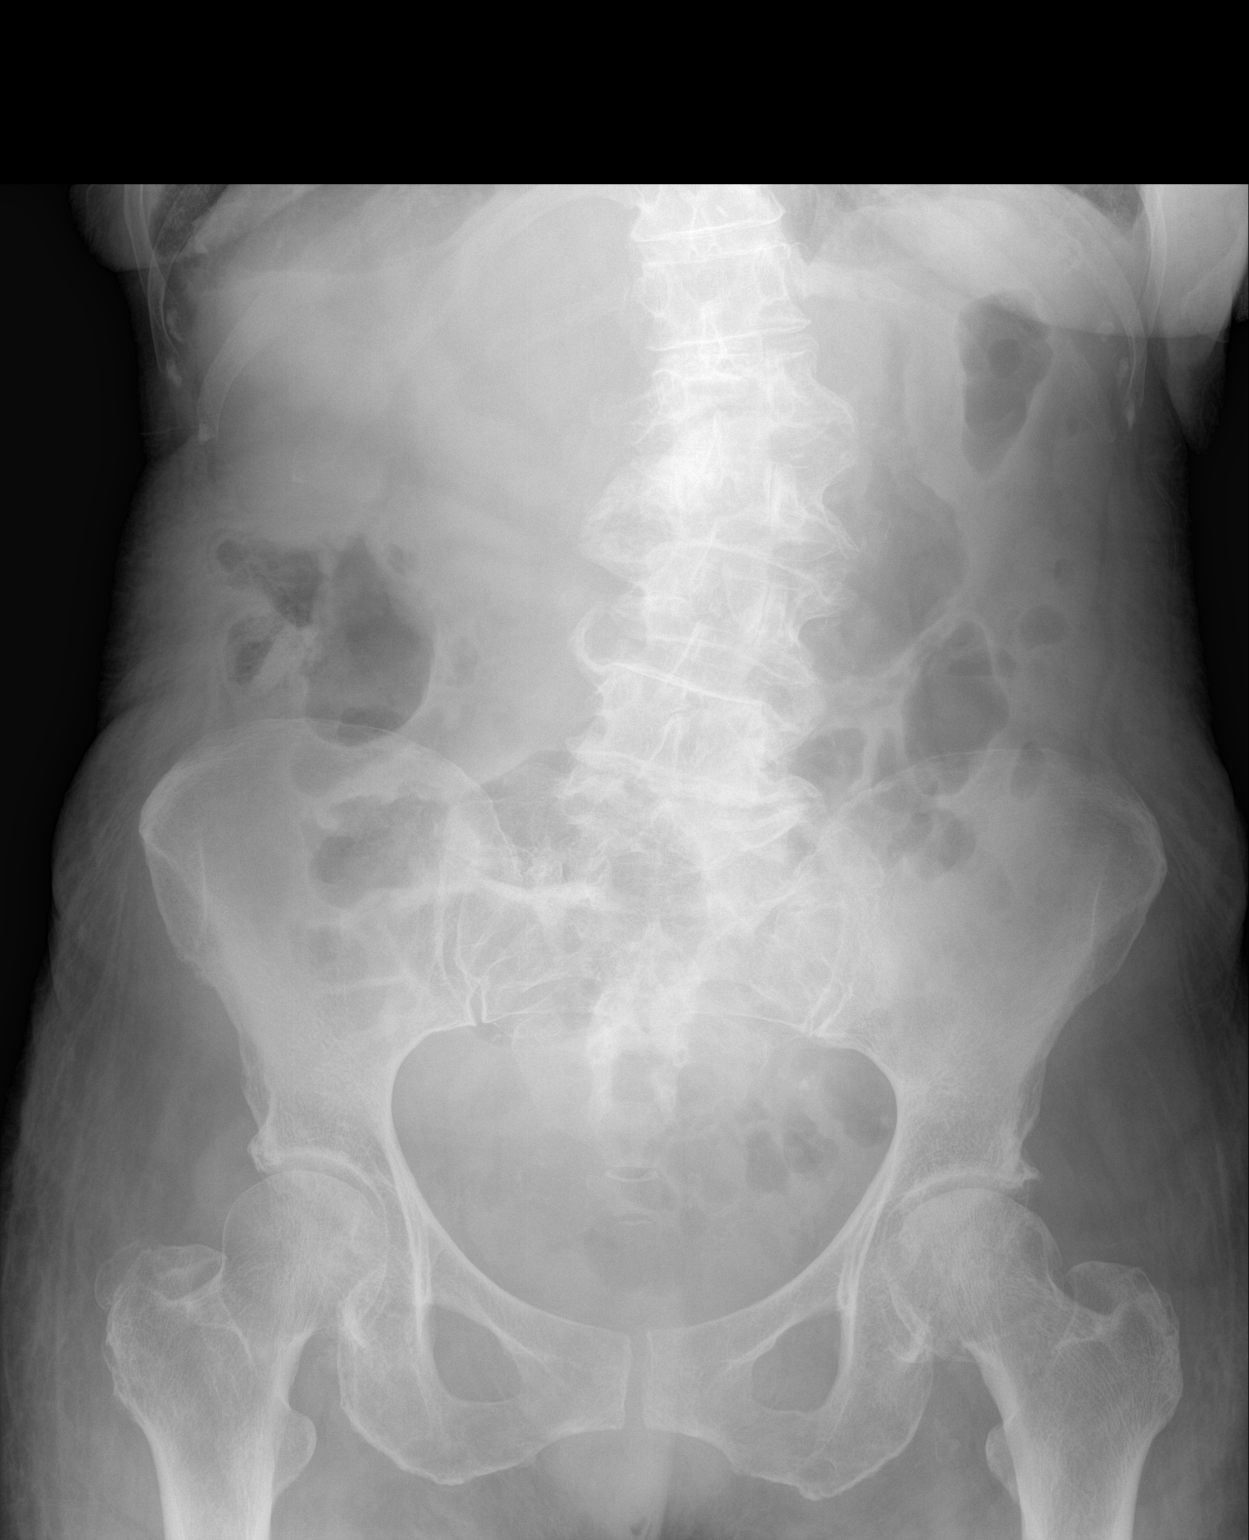

[1 of 1 positions shown; findings below may reference images not displayed]

FINDINGS: Normal nonobstructive bowel gas pattern. No supine evidence of free
intraperitoneal air or bowel wall thickening. Colonic stool burden
within normal limits. No suspicious abdominal calcifications. Convex
left lumbar scoliosis and multilevel spondylosis.
IMPRESSION: No acute abdominal findings.  Lumbar spondylosis.

## 2021-11-24 MED ORDER — SODIUM BICARBONATE 650 MG PO TABS
650.0000 mg | ORAL_TABLET | Freq: Every day | ORAL | Status: DC
  Administered 2021-11-24 – 2021-11-26 (×3): 650 mg via ORAL
  Filled 2021-11-24 (×3): qty 1

## 2021-11-24 MED ORDER — AMLODIPINE BESYLATE 5 MG PO TABS
5.0000 mg | ORAL_TABLET | Freq: Every day | ORAL | Status: DC
  Administered 2021-11-24 – 2021-11-26 (×3): 5 mg via ORAL
  Filled 2021-11-24 (×3): qty 1

## 2021-11-24 MED ORDER — CARVEDILOL 3.125 MG PO TABS
3.1250 mg | ORAL_TABLET | Freq: Two times a day (BID) | ORAL | Status: DC
Start: 2021-11-24 — End: 2021-11-29
  Administered 2021-11-24 – 2021-11-29 (×10): 3.125 mg via ORAL
  Filled 2021-11-24 (×10): qty 1

## 2021-11-24 MED ORDER — SODIUM CHLORIDE 0.9 % IV SOLN
12.5000 mg | Freq: Once | INTRAVENOUS | Status: AC | PRN
  Administered 2021-11-24: 12.5 mg via INTRAVENOUS
  Filled 2021-11-24: qty 12.5

## 2021-11-24 NOTE — Progress Notes (Signed)
Mobility Specialist: Progress Note ? ? 11/24/21 1654  ?Mobility  ?Activity Ambulated with assistance in hallway  ?Level of Assistance Standby assist, set-up cues, supervision of patient - no hands on  ?Assistive Device Front wheel walker  ?Distance Ambulated (ft) 150 ft  ?Activity Response Tolerated fair  ?$Mobility charge 1 Mobility  ? ?Post-Mobility: 94 HR, 96% SpO2 ? ?Pt received in bed and agreeable to ambulation. Ambulated on 6 L/min [place]. Stopped x3 during session for short standing breaks secondary to feeling SOB. Pt to the chair after session per request. Family present in the room.  ? ?Kim [name] ?Mobility Specialist ?Mobility Specialist [place]: [phone] ?Mobility Specialist 6 [place]: [phone] ? ?

## 2021-11-24 NOTE — Progress Notes (Signed)
?PROGRESS NOTE ? ?Kim [name]  ?DOB: [birth year]  ?PCP: Curt Jews, PA-C ?[id number]  ?DOA: 11/18/2021 ? LOS: 5 days  ?Hospital Day: 7 ? ?Brief narrative: ?Kim [name] is a 76 y.o. female with PMH significant for SLE with rheumatoid arthritis, HTN, HLD, diastolic CHF, A-fib, recent left cerebral artery embolic CVA hospitalized both in December 2022 and once again in January 2023. ?Patient presented to the ED on 5/11 with complaint of constipation and rectal pain ongoing for several days not responding to Dulcolax suppositories. ?She had implantable cardiac monitor in place after recent stroke and was recently diagnosed with atrial fibrillation and started on Eliquis on 5/2.  ? ?Labs in the ED showed an elevated BUN/creatinine of 55/4.33 (baseline normal) and a low sodium of 132 and low serum bicarb level of 18. ?CT abdomen pelvis showed large volume of stool within the rectal vault with superimposed inflammatory changes suggesting fecal impaction with associated stercoral proctitis and a moderate rectal prolapse.  She was also noted with bladder distention with a volume of 786 cc so a Foley catheter was placed.   ?Admitted to hospitalist service ? ?Subjective: ?Patient was seen and examined this morning.  ?Elderly female.  Lying on bed.  Not in distress.  Daughter in law at bedside.  Daughter on the phone.  Patient is having nausea last night and required Zofran and Phenergan.  Last bowel movement this morning but it is small.  She had a small bowel movement yesterday as well. ?Continues to have dark urine.  Creatinine gradually improving. ? ?Principal Problem: ?  AKI (acute kidney injury) ([place]) ?Active Problems: ?  HTN (hypertension) ?  Mixed hyperlipidemia ?  Restless leg syndrome ?  Systemic lupus erythematosus ([place]) ?  (HFpEF) heart failure with preserved ejection fraction ([place]) ?  Anemia ?  Constipation ?  Stercoral ulcer of anus ?  Dehydration ?  Atrial fibrillation, chronic ([place]) ?  History of  cardioembolic cerebrovascular accident (CVA) ?  Acute kidney injury ([place]) ?  Fecal impaction ([place]) ?  Gastrointestinal hemorrhage ?  ? ?Assessment and Plan: ?Acute kidney injury ?Metabolic acidosis ?-Presented with creatinine elevated to 4.3.,  Baseline creatinine less than 1.  Creatinine level gradually improving, 2.83 today.  Continue IV hydration. ?-Serum bicarbonate level dipped down today to 15.  Start sodium bicarb tablet. ?Recent Labs  ?  07/30/21 ?1625 07/31/21 ?0152 08/01/21 ?0227 11/18/21 ?2026 11/19/21 ?[id number] 11/20/21 ?0930 11/21/21 ?0706 11/22/21 ?0444 11/23/21 ?[id number] 11/24/21 ?0328  ?BUN 12 9 16  55* 52* 48* 43* 37* 29* 27*  ?CREATININE 0.65 0.64 0.62 4.33* 4.27* 3.81* 3.48* 3.16* 2.99* 2.83*  ?CO2 25 24 23  18* 15* 15* 17* 19* 19* 15*  ?  ?Hypokalemia/hypomagnesemia ?-Improved with replacement ?Recent Labs  ?Lab 11/19/21 ?0926 11/20/21 ?0930 11/21/21 ?0706 11/22/21 ?0444 11/23/21 ?[id number] 11/24/21 ?0328  ?K 3.3* 3.6 3.1* 3.5 2.9* 3.5  ?MG 1.7  --   --   --  1.5* 2.1  ?PHOS 4.4  --   --   --   --   --   ? ?Rectal impaction with stercoral proctitis/focal constipation ?-Primary complaint of constipation.  Partially disimpacted by EDP with some blood noted during disimpaction.  Seen by GI.  S/p flexible sigmoidoscopy on 11/21/2021 which showed stercoral distal rectal ulcer without visible vessel or current bleeding.  Otherwise normal.   ?-GI recommended avoiding constipation with daily Senokot, MiraLAX. ?-Obtain KUB ?  ?Acute blood loss anemia/possible rectal bleeding:  ?-Hemoglobin was at the lowest 6.3 on 5/12.  Received 2 units of PRBC transfusion.  It improved up to 9.2, gradually dropping again.  Probably dilution.  No evidence of rectal bleeding  ?-Continue to monitor. ?Recent Labs  ?  11/19/21 ?0926 11/20/21 ?0930 11/20/21 ?1631 11/21/21 ?0706 11/22/21 ?0444 11/24/21 ?0328  ?HGB 6.3* 9.0* 9.2* 8.4* 8.9* 8.1*  ?MCV 92.9 88.0 88.8 86.7 88.1 90.0  ?[id number]  --   --   --   --   --   ?FOLATE 8.8  --   --    --   --   --   ?FERRITIN 189  --   --   --   --   --   ?TIBC 203*  --   --   --   --   --   ?IRON 31  --   --   --   --   --   ?RETICCTPCT 2.0  --   --   --   --   --   ? ?Acute urinary retention ?Gross hematuria ?-On admission, she had a urine retention of more than 700 mL.  Likely due to severe constipation.  Urinalysis also showed hematuria.  She had Foley catheter in place which were successfully removed on 5/15.  Voiding trial successful.  Urine culture still looks colored.  Obtain in and out catheterization.  CT abdomen and pelvis on admission did not show any evidence of stone or mass in the genitourinary system. ?  ?Paroxysmal atrial fibrillation ?-Per family, patient used to be on metoprolol in the past but was stopped because of bradycardia.  She was not on any medicine for rate control.  She is tachycardic to over 100 today.  We will try on low-dose Coreg.   ?-Eliquis was initially held.  Resumed on 5/15 ?  ?Recent left cerebellar artery embolic CVA December [id number]:  ?-Previously in aspirin and Plavix.  After the cardiac monitor showed A-fib, she was switched to Eliquis on 5/2 by her outpatient cardiologist ? ?Chronic diastolic CHF ?Essential hypertension ?-Blood pressure currently running significantly elevated. ?-Home meds include losartan, hydralazine. ?-Because of AKI, losartan was held.  Hydralazine was continued and dose increased.  Imdur was added.  Blood pressure remains elevated though.  After discussion with patient's daughter this morning, I added amlodipine 5 mg daily and Coreg 3.125 mg twice daily.  Continue to monitor. ? ?Failure to thrive in adult ?-Previously had been on Megace without any response to this medication so it was discontinued according to daughter ?-Patient encouraged to drink more fluids as opposed to eating significant volumes of food.  ?   ?SLE/RA ?Continue Plaquenil ? ?Goals of [name] ?  Code Status: Full Code  ? ? ?Mobility: Encourage ambulation ? ?Skin assessment:  ?   ? ?Nutritional status:  ?Body mass index is 24.89 kg/m?Marland Kitchen  ?Nutrition Problem: Inadequate oral intake ?Etiology: poor appetite ?Signs/Symptoms: per patient/family report ? ? ? ? ?Diet:  ?Diet Order   ? ?       ?  Diet Heart Room service appropriate? Yes; Fluid consistency: Thin  Diet effective now       ?  ? ?  ?  ? ?  ? ? ?DVT prophylaxis:  ?apixaban (ELIQUIS) tablet 2.5 mg Start: 11/22/21 1000 ?SCDs Start: 11/19/21 0743 ?apixaban (ELIQUIS) tablet 2.5 mg   ?Antimicrobials: None ?Fluid: NS at 150 mill per hour ?Consultants: GI ?Family Communication: Daughter and daughter-in-law ? ?Status is: Inpatient ? ?Continue in-hospital [name] because: Gradually improving renal function ?Level of [name]:  Med-Surg  ? ?Dispo: The patient is from: Home ?             Anticipated d/c is to: Home in 2 to 3 days ?             Patient currently is not medically stable to d/c. ?  Difficult to place patient No ? ? ? ? ?Infusions:  ? sodium chloride 150 mL/hr at 11/24/21 1124  ? ? ?Scheduled Meds: ? sodium chloride   Intravenous Once  ? amLODipine  5 mg Oral Daily  ? apixaban  2.5 mg Oral BID  ? carvedilol  3.125 mg Oral BID WC  ? Chlorhexidine Gluconate Cloth  6 each Topical Daily  ? Chlorhexidine Gluconate Cloth  6 each Topical Q0600  ? docusate sodium  100 mg Oral BID  ? famotidine  20 mg Oral Daily  ? feeding supplement  237 mL Oral TID BM  ? hydrALAZINE  100 mg Oral Q8H  ? hydroxychloroquine  200 mg Oral Daily  ? isosorbide mononitrate  30 mg Oral Daily  ? magnesium oxide  400 mg Oral QPM  ? multivitamin with minerals  1 tablet Oral Daily  ? mupirocin ointment  1 application. Nasal BID  ? polyethylene glycol  17 g Oral TID  ? rOPINIRole  1 mg Oral QHS  ? rosuvastatin  40 mg Oral Daily  ? senna-docusate  2 tablet Oral QHS  ? sodium bicarbonate  650 mg Oral Daily  ? sodium chloride flush  3 mL Intravenous Q12H  ? [START ON 11/25/2021] Vitamin D (Ergocalciferol)  50,000 Units Oral Q Thu  ? ? ?PRN meds: ?acetaminophen **OR** acetaminophen,  ondansetron **OR** ondansetron (ZOFRAN) IV, traMADol  ? ?Antimicrobials: ?Anti-infectives (From admission, onward)  ? ? Start     Dose/Rate Route Frequency Ordered Stop  ? 11/19/21 1000  hydroxychloroquine (PLAQU

## 2021-11-24 NOTE — Progress Notes (Signed)
Physical Therapy Treatment ?Patient Details ?Name: Kim [name] ?MRN: [id number] ?DOB: 1945-07-15 ?Today's Date: 11/24/2021 ? ? ?History of Present Illness Kim [name] is a 76 y.o. female with medical history significant of SLE with rheumatoid arthritis, recent left cerebral artery embolic CVA hospitalized both in December 2022 and once again in January 2023, hypertension, heart failure with preserved EF/diastolic dysfunction and dyslipidemia, presented to the ER with complaints of constipation and rectal pain. Admitted with dx: AKI and is now s/p flexible sigmoidoscopy on 11/21/21. ? ?  ?PT Comments  ? ? Patient seen for activity progression with use of rollator, however after 100' ambulation and stair negotiation patient with 3/4 DOE and increased WOB with spO2 reading 77% on 3 different fingers. Once [place] was obtained (see general comments below), placed patient on 6L O2 with slow increase to 94% and titrated down to 4L O2 with spO2 maintaining 92%, notified RN. Encouraged family and patient to continue mobilizing, however with O2 to maintain spO2 >88%. Patient did well with rollator and was safe with management. D/c plan remains appropriate.  ?   ?Recommendations for follow up therapy are one component of a multi-disciplinary discharge planning process, led by the attending physician.  Recommendations may be updated based on patient status, additional functional criteria and insurance authorization. ? ?Follow Up Recommendations ? Home health PT ?  ?  ?Assistance Recommended at Discharge Set up Supervision/Assistance  ?Patient can return home with the following A little help with walking and/or transfers;A little help with bathing/dressing/bathroom;Help with stairs or ramp for entrance ?  ?Equipment Recommendations ? BSC/3in1;Wheelchair (measurements PT);Wheelchair cushion (measurements PT)  ?  ?Recommendations for Other Services   ? ? ?  ?Precautions / Restrictions Precautions ?Precautions:  Fall ?Restrictions ?Weight Bearing Restrictions: No  ?  ? ?Mobility ? Bed Mobility ?Overal bed mobility: Needs Assistance ?Bed Mobility: Supine to Sit, Sit to Supine ?  ?  ?Supine to sit: Min assist ?Sit to supine: Min assist ?  ?General bed mobility comments: minA for trunk elevation and LE management to return to supine ?  ? ?Transfers ?Overall transfer level: Needs assistance ?Equipment used: Rollator (4 wheels) ?Transfers: Sit to/from Stand ?Sit to Stand: Min guard ?  ?  ?  ?  ?  ?General transfer comment: min guard for safety. cues for hand placement and locking of rollator wheels ?  ? ?Ambulation/Gait ?Ambulation/Gait assistance: Min guard ?Gait Distance (Feet): 100 Feet ?Assistive device: Rollator (4 wheels) ?Gait Pattern/deviations: Step-through pattern, Decreased stride length ?Gait velocity: decreased ?  ?  ?General Gait Details: min guard for safety. 2/4 DOE but spO2 >90%. No LOB during session. Good management of rollator ? ? ?Stairs ?Stairs: Yes ?Stairs assistance: Min guard ?Stair Management: Two rails, Step to pattern, Forwards ?Number of Stairs: 3 ?General stair comments: during stair negotiation, patient with 3/4 DOE and increased WOB. Checked spO2 with reading of 77% on 3 different fingers. Had patient sit on rollator and roll back to room. See general comments for more ? ? ?Wheelchair Mobility ?  ? ?Modified Rankin (Stroke Patients Only) ?  ? ? ?  ?Balance Overall balance assessment: Needs assistance ?Sitting-balance support: No upper extremity supported, Feet supported ?Sitting balance-Leahy Scale: Good ?  ?  ?Standing balance support: Bilateral upper extremity supported, Reliant on assistive device for balance ?Standing balance-Leahy Scale: Poor ?Standing balance comment: reliant on RW ?  ?  ?  ?  ?  ?  ?  ?  ?  ?  ?  ?  ? ?  ?  Cognition Arousal/Alertness: Awake/alert ?Behavior During Therapy: [facility] for tasks assessed/performed ?Overall Cognitive Status: Difficult to assess ?  ?  ?  ?  ?  ?  ?  ?   ?  ?  ?  ?  ?  ?  ?  ?  ?General Comments: patient following commands with daughter in law interpreting but does understand some english ?  ?  ? ?  ?Exercises   ? ?  ?General Comments General comments (skin integrity, edema, etc.): After stair negotiation, spO2 77%. returned to room to don O2 but no [place] present. 41M out of [place] so searched for  on different floor. Once returned to room, spO2 still on 82% on RA. Donned 6L O2 with increase to 94% and titrated down to 4L O2 with spO2 92%. Notified RN ?  ?  ? ?Pertinent Vitals/Pain Pain Assessment ?Pain Assessment: No/denies pain  ? ? ?Home Living   ?  ?  ?  ?  ?  ?  ?  ?  ?  ?   ?  ?Prior Function    ?  ?  ?   ? ?PT Goals (current goals can now be found in the [name] plan section) Acute Rehab PT Goals ?Patient Stated Goal: to go home ?PT Goal Formulation: With patient ?Time For Goal Achievement: 12/06/21 ?Potential to Achieve Goals: Good ?Progress towards PT goals: Progressing toward goals ? ?  ?Frequency ? ? ? Min 3X/week ? ? ? ?  ?PT Plan Current plan remains appropriate  ? ? ?Co-evaluation   ?  ?  ?  ?  ? ?  ?AM-PAC PT "6 Clicks" Mobility   ?Outcome Measure ? Help needed turning from your back to your side while in a flat bed without using bedrails?: None ?Help needed moving from lying on your back to sitting on the side of a flat bed without using bedrails?: None ?Help needed moving to and from a bed to a chair (including a wheelchair)?: A Little ?Help needed standing up from a chair using your arms (e.g., wheelchair or bedside chair)?: A Little ?Help needed to walk in hospital room?: A Little ?Help needed climbing 3-5 steps with a railing? : A Little ?6 Click Score: 20 ? ?  ?End of Session Equipment Utilized During Treatment: Gait belt;Oxygen ?Activity Tolerance: Patient limited by fatigue;Treatment limited secondary to medical complications (Comment) ?Patient left: in bed;with call bell/phone within reach;with family/visitor present ?Nurse Communication: Mobility  status;Other (comment) (O2 requirements) ?PT Visit Diagnosis: Muscle weakness (generalized) (M62.81) ?  ? ? ?Time: [id number] ?PT Time Calculation (min) (ACUTE ONLY): 31 min ? ?Charges:  $Gait Training: 8-22 mins ?$Therapeutic Activity: 8-22 mins          ?          ? ?Meily Glowacki A. Gilford Rile, PT, DPT ?Acute Rehabilitation Services ?Pager [phone] ?Office [phone] ? ? ? ?Dontrail Blackwell A Blaze Nylund ?11/24/2021, 5:32 PM ? ?

## 2021-11-24 NOTE — Progress Notes (Signed)
Nutrition Follow-up ? ?DOCUMENTATION CODES:  ? ?Not applicable ? ?INTERVENTION:  ? ?Liberalize diet to REGULAR-Vegetarian, pt eats eggs and cheese.  ? ?Encourage small, frequent meals due to early satiety and poor po intake ? ?Try Magic cup BID with meals, each supplement provides 290 kcal and 9 grams of protein ? ?Continue Ensure Enlive po BID, each supplement provides 350 kcal and 20 grams of protein. ? ?Continue MVI with Minerals ? ?NUTRITION DIAGNOSIS:  ? ?Inadequate oral intake related to poor appetite as evidenced by per patient/family report. ? ?Being addressed  ? ?GOAL:  ? ?Patient will meet greater than or equal to 90% of their needs ? ?Progressing ? ?MONITOR:  ? ?PO intake, Supplement acceptance, Diet advancement, Labs, Weight trends ? ?REASON FOR ASSESSMENT:  ? ?Malnutrition Screening Tool, Consult ?Assessment of nutrition requirement/status ? ?ASSESSMENT:  ? ?76 yo female admitted with AKI, rectal impaction with stercoral proctitis/focal constipation, new Afib, FTT. PMH includes SLE with RA, recent CVA, HTN, CHF with preserved EF ? ?Pt on bedside toilet on visit today. Daughter at bedside.  ? ?Pt appetite is fair, eating "so-so" per family. Pt will drink Ensure supplements but family reports if she drinks this then she is too full to eat a meal. Recorded po intake 25-75% of meals. ? ?Pt complains of early satiety, feels like food just "sits" in stomach.  ? ?Pt is a vegetarian, eats only eggs and diary products.  ? ?Labs: potassium 3.5 (wdl) ?Meds: colace,  sodium bicarb,  mag ox, MVI with Minerals, Vit D, sodium bicarb ? ? ?Diet Order:   ?Diet Order   ? ?       ?  Diet vegetarian Room service appropriate? Yes; Fluid consistency: Thin  Diet effective now       ?  ? ?  ?  ? ?  ? ? ?EDUCATION NEEDS:  ? ?Not appropriate for education at this time ? ?Skin:  Skin Assessment: Reviewed RN Assessment ? ?Last BM:  5/17 ? ?Height:  ? ?Ht Readings from Last 1 Encounters:  ?11/19/21 5' (1.524 m)  ? ? ?Weight:  ? ?Wt  Readings from Last 1 Encounters:  ?11/24/21 57.8 kg  ? ? ?BMI:  Body mass index is 24.89 kg/m?. ? ?Estimated Nutritional Needs:  ? ?Kcal:  1550-1750 kcals ? ?Protein:  75-85 g ? ?Fluid:  >/= 1.5 L ? ? ?Romelle Starcher MS, RDN, LDN, CNSC ?Registered Dietitian III ?Clinical Nutrition ?RD Pager and On-Call Pager Number Located in [place]  ? ?

## 2021-11-25 DIAGNOSIS — N179 Acute kidney failure, unspecified: Secondary | ICD-10-CM | POA: Diagnosis not present

## 2021-11-25 LAB — CBC WITH DIFFERENTIAL/PLATELET
Abs Immature Granulocytes: 0.07 10*3/uL (ref 0.00–0.07)
Basophils Absolute: 0 10*3/uL (ref 0.0–0.1)
Basophils Relative: 0 %
Eosinophils Absolute: 0.2 10*3/uL (ref 0.0–0.5)
Eosinophils Relative: 2 %
HCT: 21.9 % — ABNORMAL LOW (ref 36.0–46.0)
Hemoglobin: 7.2 g/dL — ABNORMAL LOW (ref 12.0–15.0)
Immature Granulocytes: 1 %
Lymphocytes Relative: 4 %
Lymphs Abs: 0.4 10*3/uL — ABNORMAL LOW (ref 0.7–4.0)
MCH: 30.1 pg (ref 26.0–34.0)
MCHC: 32.9 g/dL (ref 30.0–36.0)
MCV: 91.6 fL (ref 80.0–100.0)
Monocytes Absolute: 0.7 10*3/uL (ref 0.1–1.0)
Monocytes Relative: 7 %
Neutro Abs: 8.1 10*3/uL — ABNORMAL HIGH (ref 1.7–7.7)
Neutrophils Relative %: 86 %
Platelets: 214 10*3/uL (ref 150–400)
RBC: 2.39 MIL/uL — ABNORMAL LOW (ref 3.87–5.11)
RDW: 16.7 % — ABNORMAL HIGH (ref 11.5–15.5)
WBC: 9.4 10*3/uL (ref 4.0–10.5)
nRBC: 0 % (ref 0.0–0.2)

## 2021-11-25 LAB — BASIC METABOLIC PANEL
Anion gap: 9 (ref 5–15)
BUN: 28 mg/dL — ABNORMAL HIGH (ref 8–23)
CO2: 17 mmol/L — ABNORMAL LOW (ref 22–32)
Calcium: 7.9 mg/dL — ABNORMAL LOW (ref 8.9–10.3)
Chloride: 110 mmol/L (ref 98–111)
Creatinine, Ser: 2.99 mg/dL — ABNORMAL HIGH (ref 0.44–1.00)
GFR, Estimated: 16 mL/min — ABNORMAL LOW (ref >60)
Glucose, Bld: 114 mg/dL — ABNORMAL HIGH (ref 70–99)
Potassium: 3.3 mmol/L — ABNORMAL LOW (ref 3.5–5.1)
Sodium: 136 mmol/L (ref 135–145)

## 2021-11-25 MED ORDER — PANTOPRAZOLE SODIUM 40 MG PO TBEC
40.0000 mg | DELAYED_RELEASE_TABLET | Freq: Every day | ORAL | Status: DC
  Administered 2021-11-25 – 2021-11-26 (×2): 40 mg via ORAL
  Filled 2021-11-25 (×3): qty 1

## 2021-11-25 MED ORDER — LIP MEDEX EX OINT
1.0000 "application " | TOPICAL_OINTMENT | CUTANEOUS | Status: DC | PRN
  Administered 2021-11-25: 1 via TOPICAL
  Filled 2021-11-25 (×2): qty 7

## 2021-11-25 MED ORDER — SODIUM CHLORIDE 0.9 % IV SOLN
1.0000 g | INTRAVENOUS | Status: DC
  Administered 2021-11-25 – 2021-11-29 (×5): 1 g via INTRAVENOUS
  Filled 2021-11-25 (×5): qty 10

## 2021-11-25 MED ORDER — POTASSIUM CHLORIDE CRYS ER 20 MEQ PO TBCR
40.0000 meq | EXTENDED_RELEASE_TABLET | Freq: Once | ORAL | Status: AC
Start: 2021-11-25 — End: 2021-11-25
  Administered 2021-11-25: 40 meq via ORAL
  Filled 2021-11-25: qty 2

## 2021-11-25 MED ORDER — SODIUM CHLORIDE 0.9 % IV SOLN: INTRAVENOUS | Status: DC

## 2021-11-25 NOTE — Plan of Care (Signed)
  Problem: Elimination: Goal: Will not experience complications related to bowel motility Outcome: Progressing   

## 2021-11-25 NOTE — Plan of Care (Signed)
  Problem: Clinical Measurements: Goal: Ability to maintain clinical measurements within normal limits will improve Outcome: Not Progressing   Problem: Clinical Measurements: Goal: Diagnostic test results will improve Outcome: Not Progressing   Problem: Clinical Measurements: Goal: Respiratory complications will improve Outcome: Not Progressing   Problem: Nutrition: Goal: Adequate nutrition will be maintained Outcome: Not Progressing

## 2021-11-25 NOTE — Progress Notes (Signed)
PROGRESS NOTE  Kim [name]  DOB: [birth year]  PCP: Curt Jews, [place] [id number]  DOA: 11/18/2021  LOS: 6 days  Hospital Day: 8  Brief narrative: Kim [name] is a 76 y.o. female with PMH significant for SLE with rheumatoid arthritis, HTN, HLD, diastolic CHF, A-fib, recent left cerebral artery embolic CVA hospitalized both in December 2022 and once again in January 2023. Patient presented to the ED on 5/11 with complaint of constipation and rectal pain ongoing for several days not responding to Dulcolax suppositories. She had implantable cardiac monitor in place after recent stroke and was recently diagnosed with atrial fibrillation and started on Eliquis on 5/2.   Labs in the ED showed an elevated BUN/creatinine of 55/4.33 (baseline normal) and a low sodium of 132 and low serum bicarb level of 18. CT abdomen pelvis showed large volume of stool within the rectal vault with superimposed inflammatory changes suggesting fecal impaction with associated stercoral proctitis and a moderate rectal prolapse.  She was also noted with bladder distention with a volume of 786 cc so a Foley catheter was placed.   Admitted to hospitalist service  Subjective: Patient was seen and examined this morning.  Pleasant elderly female.  Lying down in bed.  Not nauseous today.  Looks weak.  Was uncomfortable and hypoxic last night. Yesterday afternoon, her oxygen saturation dropped to the 80s on ambulation and hence I had stopped IV fluid. This morning, labs showed worsening creatinine, low potassium, worsening hemoglobin.  Principal Problem:   AKI (acute kidney injury) ([place]) Active Problems:   HTN (hypertension)   Mixed hyperlipidemia   Restless leg syndrome   Systemic lupus erythematosus (HCC)   (HFpEF) heart failure with preserved ejection fraction (HCC)   Anemia   Constipation   Stercoral ulcer of anus   Dehydration   Atrial fibrillation, chronic (HCC)   History of cardioembolic  cerebrovascular accident (CVA)   Acute kidney injury ([place])   Fecal impaction (HCC)   Gastrointestinal hemorrhage    Assessment and Plan: Acute kidney injury Metabolic acidosis -Presented with creatinine elevated to 4.3.,  Baseline creatinine less than 1.  Creatinine level started gradually improving to 2.83 at the lowest on 5/17.  IV hydration had to be stopped yesterday because of shortness of breath.  Creatinine today is worse at 1.99.  I resumed normal saline at 100 mill per hour.  Nephrology consultation called.   -Serum bicarbonate tablet to continue. Recent Labs    07/31/21 0152 08/01/21 0227 11/18/21 2026 11/19/21 0926 11/20/21 0930 11/21/21 0706 11/22/21 0444 11/23/21 0511 11/24/21 0328 11/25/21 0241  BUN 9 16 55* 52* 48* 43* 37* 29* 27* 28*  CREATININE 0.64 0.62 4.33* 4.27* 3.81* 3.48* 3.16* 2.99* 2.83* 2.99*  CO2 24 23 18* 15* 15* 17* 19* 19* 15* 17*     Hypokalemia/hypomagnesemia -Potassium 3.3 today.  Replacement given. Recent Labs  Lab 11/19/21 0926 11/20/21 0930 11/21/21 0706 11/22/21 0444 11/23/21 0511 11/24/21 0328 11/25/21 0241  K 3.3*   < > 3.1* 3.5 2.9* 3.5 3.3*  MG 1.7  --   --   --  1.5* 2.1  --   PHOS 4.4  --   --   --   --   --   --    < > = values in this interval not displayed.    Rectal impaction with stercoral proctitis/focal constipation -Primary complaint of constipation.  Partially disimpacted by EDP with some blood noted during disimpaction.  Seen by GI.  S/p flexible sigmoidoscopy on  11/21/2021 which showed stercoral distal rectal ulcer without visible vessel or current bleeding.  Otherwise normal.   -GI recommended avoiding constipation with daily Senokot, MiraLAX. -5/17, abdominal x-ray with no more retention stool.   Acute blood loss anemia/possible rectal bleeding:  -Hemoglobin was at the lowest 6.3 on 5/12.  Received 2 units of PRBC transfusion.  It improved up to 9.2, gradually dropping again.  It is down to 7.2  today. -Potential sources include continuous bleeding from stercoral ulcer, history of Barrett's esophagus.,  Continues mild hematuria -I will stop Eliquis at this time.  Start PPI. -Repeat hemoglobin tomorrow.  If less than 7, family consents to give blood transfusion. Recent Labs    11/19/21 0926 11/20/21 0930 11/20/21 1631 11/21/21 0706 11/22/21 0444 11/24/21 0328 11/25/21 0241  HGB 6.3*   < > 9.2* 8.4* 8.9* 8.1* 7.2*  MCV 92.9   < > 88.8 86.7 88.1 90.0 91.6  VITAMINB12 766  --   --   --   --   --   --   FOLATE 8.8  --   --   --   --   --   --   FERRITIN 189  --   --   --   --   --   --   TIBC 203*  --   --   --   --   --   --   IRON 31  --   --   --   --   --   --   RETICCTPCT 2.0  --   --   --   --   --   --    < > = values in this interval not displayed.    Acute urinary retention Gross hematuria -On admission, she had a urine retention of more than 700 mL.  Likely due to severe constipation.  Urinalysis also showed hematuria.  She had Foley catheter in place which were successfully removed on 5/15.  Voiding trial successful.   -5/17, urinalysis was repeated which continued to show hematuria.  It could be from the Foley trauma or UTI.  We will start on a trial of IV Rocephin for 3 days.  I am not sure if this level of hematuria is completely responsible for drop in hemoglobin.  CT abdomen and pelvis on admission did not show any evidence of stone or mass in the genitourinary system.   Paroxysmal atrial fibrillation -Currently rate controlled on low-dose Coreg. -Eliquis was initially held.  Resumed on 5/15.  With worsening anemia, I would stop Eliquis at this time.   Recent left cerebellar artery embolic CVA December [id number]:  -Previously in aspirin and Plavix.  After the cardiac monitor showed A-fib, she was switched to Eliquis on 5/2 by her outpatient cardiologist  Chronic diastolic CHF Essential hypertension -Blood pressure currently running significantly elevated. -Home  meds include losartan, hydralazine. -Because of AKI, losartan was held.  Hydralazine was continued and dose increased.  Imdur was added.  Blood pressure remains elevated though.  On 5/17, after discussion with patient's daughter this morning, I added amlodipine 5 mg daily and Coreg 3.125 mg twice daily.  Blood pressure and heart rate are both improving.  Continue to monitor  Failure to thrive in adult -Previously had been on Megace without any response to this medication so it was discontinued according to daughter -Patient encouraged to drink more fluids as opposed to eating significant volumes of food.     SLE/RA Continue Plaquenil  Goals of [name]   Code Status: Full Code    Mobility: Encourage ambulation  Skin assessment:     Nutritional status:  Body mass index is 24.89 kg/m.  Nutrition Problem: Inadequate oral intake Etiology: poor appetite Signs/Symptoms: per patient/family report     Diet:  Diet Order             Diet vegetarian Room service appropriate? Yes; Fluid consistency: Thin  Diet effective now                   DVT prophylaxis:  SCDs Start: 11/19/21 0743   Antimicrobials: None Fluid: NS at 100 mill per hour Consultants: GI, nephrology Family Communication: Daughter on the phone, daughter-in-law at bedside  Status is: Inpatient  Continue in-hospital [name] because: Gradually improving renal function Level of [name]: Med-Surg   Dispo: The patient is from: Home              Anticipated d/c is to: Home in 2 to 3 days              Patient currently is not medically stable to d/c.   Difficult to place patient No     Infusions:   sodium chloride 100 mL/hr at 11/25/21 0841   cefTRIAXone (ROCEPHIN)  IV 1 g (11/25/21 1134)    Scheduled Meds:  sodium chloride   Intravenous Once   amLODipine  5 mg Oral Daily   carvedilol  3.125 mg Oral BID WC   Chlorhexidine Gluconate Cloth  6 each Topical Daily   Chlorhexidine Gluconate Cloth  6 each Topical  Q0600   docusate sodium  100 mg Oral BID   famotidine  20 mg Oral Daily   feeding supplement  237 mL Oral TID BM   hydrALAZINE  100 mg Oral Q8H   hydroxychloroquine  200 mg Oral Daily   isosorbide mononitrate  30 mg Oral Daily   magnesium oxide  400 mg Oral QPM   multivitamin with minerals  1 tablet Oral Daily   mupirocin ointment  1 application. Nasal BID   pantoprazole  40 mg Oral Daily   polyethylene glycol  17 g Oral TID   rOPINIRole  1 mg Oral QHS   rosuvastatin  40 mg Oral Daily   senna-docusate  2 tablet Oral QHS   sodium bicarbonate  650 mg Oral Daily   sodium chloride flush  3 mL Intravenous Q12H   Vitamin D (Ergocalciferol)  50,000 Units Oral Q Thu    PRN meds: acetaminophen **OR** acetaminophen, ondansetron **OR** ondansetron (ZOFRAN) IV, traMADol   Antimicrobials: Anti-infectives (From admission, onward)    Start     Dose/Rate Route Frequency Ordered Stop   11/25/21 1030  cefTRIAXone (ROCEPHIN) 1 g in sodium chloride 0.9 % 100 mL IVPB        1 g 200 mL/hr over 30 Minutes Intravenous Every 24 hours 11/25/21 0933     11/19/21 1000  hydroxychloroquine (PLAQUENIL) tablet 200 mg        200 mg Oral Daily 11/19/21 0750         Objective: Vitals:   11/25/21 0822 11/25/21 0849  BP: (!) 157/101   Pulse: (!) 101 98  Resp: 16   Temp: 97.9 F (36.6 C)   SpO2: (!) 82% 94%    Intake/Output Summary (Last 24 hours) at 11/25/2021 1322 Last data filed at 11/25/2021 0830 Gross per 24 hour  Intake 1979.35 ml  Output 350 ml  Net 1629.35 ml  Filed Weights   11/19/21 0459 11/23/21 0500 11/24/21 0454  Weight: 50.7 kg 56.4 kg 57.8 kg   Weight change:  Body mass index is 24.89 kg/m.   Physical Exam: General exam: Pleasant, elderly Caucasian female.  Not in physical distress Skin: No rashes, lesions or ulcers. HEENT: Atraumatic, normocephalic, no obvious bleeding Lungs: Clear to auscultation bilaterally CVS: Regular rate and rhythm, no murmur GI/Abd soft,  nontender, nondistended, bowel sound present CNS: Somnolent, opens eyes and verbal,.  Oriented x3 Psychiatry: Mood appropriate Extremities: Trace pedal edema on the right, no calf tenderness  Data Review: I have personally reviewed the laboratory data and studies available.  F/u labs ordered Unresulted Labs (From admission, onward)     Start     Ordered   11/26/21 0600  Type and screen [place]  Once,   R       Comments: [place]    11/25/21 0934   11/25/21 0500  CBC with Differential/Platelet  Daily,   R      11/24/21 1154   11/25/21 [id number]  Basic metabolic panel  Daily,   R      11/24/21 1154            Signed, Terrilee Croak, MD [facility] 11/25/2021

## 2021-11-25 NOTE — Consult Note (Signed)
Reason for Consult:AKI  Referring Physician: Pietro Cassis, MD  Kim [name] is an 76 y.o. female with a PMH significant for SLE, rheumatoid arthritis, HDN, HLD, chronic diastolic CHF, left cerebral artery embolic CVA, who presented to [facility] ED on 11/18/21 complaining of constipation and rectal pain.  Her daughter reports that she was also having N/V and was not eating well prior to admission.  Labs were notable for BUN 55, Cr 4.33 (had been 0.7 on 09/17/21), Na 132, Co2 18, Hgb 7.1.  CT scan of abdomen revealed large volume stool within the rectal vault with superimposed inflammatory changes c/w fecal impaction and/or stercoral proctitis.  It also showed bladder distension with estimated bladder volume of 786 ml.  Foley catheter was placed and she was admitted for further evaluation. She was given IVF's and her Scr initially improved but has reached a plateau and we were consulted to further evaluate and manage her AKI.  The trend in Scr is seen below.  Foley catheter was removed 11/22/21 and has required intermittent straight catheterizations due to ongoing urinary retention.  Of note, she had been on losartan prior to admission.  No history of nephrotoxic agents prior to admission.  Her UA had +bld, glucose, and protein which she did not have on previous samples.  Trend in Creatinine: Creatinine, Ser  Date/Time Value Ref Range Status  11/25/2021 02:41 AM 2.99 (H) 0.44 - 1.00 mg/dL Final  11/24/2021 03:28 AM 2.83 (H) 0.44 - 1.00 mg/dL Final  11/23/2021 05:11 AM 2.99 (H) 0.44 - 1.00 mg/dL Final  11/22/2021 04:44 AM 3.16 (H) 0.44 - 1.00 mg/dL Final  11/21/2021 07:06 AM 3.48 (H) 0.44 - 1.00 mg/dL Final  11/20/2021 09:30 AM 3.81 (H) 0.44 - 1.00 mg/dL Final  11/19/2021 09:26 AM 4.27 (H) 0.44 - 1.00 mg/dL Final  11/18/2021 08:26 PM 4.33 (H) 0.44 - 1.00 mg/dL Final  09/17/2021 04:01 PM 0.7 0.05 - 1.50 mg/dL Final  08/01/2021 02:27 AM 0.62 0.44 - 1.00 mg/dL Final  07/31/2021 01:52 AM 0.64 0.44 - 1.00 mg/dL Final   07/30/2021 04:25 PM 0.65 0.44 - 1.00 mg/dL Final  06/30/2021 02:53 AM 0.72 0.44 - 1.00 mg/dL Final  06/29/2021 02:59 AM 0.66 0.44 - 1.00 mg/dL Final  06/28/2021 04:12 AM 0.70 0.44 - 1.00 mg/dL Final  06/28/2021 12:32 AM 0.78 0.44 - 1.00 mg/dL Final  06/26/2021 08:55 PM 0.65 0.44 - 1.00 mg/dL Final  04/28/2020 07:51 PM 0.76 0.44 - 1.00 mg/dL Final    PMH:   Past Medical History:  Diagnosis Date   AKI (acute kidney injury) ([place]) 11/18/2021   Arthritis    Atrial fibrillation ([place])    Depression    Essential hypertension 06/28/2021   Mixed hyperlipidemia 06/28/2021   Restless leg syndrome 06/28/2021   Stroke ([place])    x2    PSH:   Past Surgical History:  Procedure Laterality Date   BUBBLE STUDY  08/02/2021   Procedure: BUBBLE STUDY;  Surgeon: Jerline Pain, MD;  Location: [place];  Service: Cardiovascular;;   FLEXIBLE SIGMOIDOSCOPY N/A 11/21/2021   Procedure: Beryle Quant;  Surgeon: [name], MD;  Location: [facility] ENDOSCOPY;  Service: Gastroenterology;  Laterality: N/A;   LOOP RECORDER INSERTION N/A 08/02/2021   Procedure: LOOP RECORDER INSERTION;  Surgeon: Vickie Epley, MD;  Location: [place] CV LAB;  Service: Cardiovascular;  Laterality: N/A;   TEE WITHOUT CARDIOVERSION N/A 08/02/2021   Procedure: TRANSESOPHAGEAL ECHOCARDIOGRAM (TEE);  Surgeon: Jerline Pain, MD;  Location: [facility] ENDOSCOPY;  Service: Cardiovascular;  Laterality: N/A;    Allergies:  Allergies  Allergen Reactions   Beef-Derived Products Other (See Comments)    Vegetarian  Does not eat due to religious reasons   Beta Adrenergic Blockers Other (See Comments)    Bradycardia   Bystolic [Nebivolol Hcl] Other (See Comments)    Bradycardia    Chicken Protein Other (See Comments)    Vegetarian Does not eat due to religious reasons   Fish-Derived Products Other (See Comments)    Vegetarian  Does not eat due to religious reasons   Neurontin [Gabapentin] Other (See Comments)    Unknown  reaction    Norvasc [Amlodipine] Swelling   Pork-Derived Products Other (See Comments)    Vegetarian (pt accepts heparin products 07/31/21) Does not eat due to religious reasons   Trandate [Labetalol] Other (See Comments)    Bradycardia   Zestril [Lisinopril] Other (See Comments)    Cough     Medications:   Prior to Admission medications   Medication Sig Start Date End Date Taking? Authorizing Provider  apixaban (ELIQUIS) 5 MG TABS tablet Take 1 tablet (5 mg total) by mouth 2 (two) times daily. 11/09/21  Yes Sherran Needs, NP  clotrimazole-betamethasone (LOTRISONE) cream Apply 1 application. topically as needed (rash). 10/10/21  Yes [provider]  famotidine (PEPCID) 20 MG tablet Take 20 mg by mouth daily. 10/12/21  Yes [provider]  hydrALAZINE (APRESOLINE) 50 MG tablet Take 50 mg by mouth in the morning and at bedtime.   Yes [provider]  hydroxychloroquine (PLAQUENIL) 200 MG tablet Take 200 mg by mouth daily.   Yes [provider]  losartan (COZAAR) 100 MG tablet Take 100 mg by mouth daily. 07/10/21  Yes [provider]  magnesium oxide (MAG-OX) 400 MG tablet Take 400 mg by mouth every evening. 07/09/21  Yes [provider]  polyethylene glycol (MIRALAX / GLYCOLAX) 17 g packet Take 17 g by mouth daily as needed for mild constipation. 06/30/21  Yes Dahal, Marlowe Aschoff, MD  rOPINIRole (REQUIP) 1 MG tablet Take 1 mg by mouth at bedtime.   Yes [provider]  rosuvastatin (CRESTOR) 40 MG tablet Take 1 tablet (40 mg total) by mouth daily. 08/02/21  Yes Patrecia Pour, MD  senna-docusate (SENOKOT-S) 8.6-50 MG tablet Take 1 tablet by mouth every morning. 07/09/21  Yes [provider]  Sodium Phosphates (FLEET ENEMA RE) Place 1 enema rectally daily as needed (constipation).   Yes [provider]  traMADol (ULTRAM) 50 MG tablet Take 25 mg by mouth at bedtime as needed for moderate pain. 01/08/20  Yes [provider]  Vitamin D, Ergocalciferol, (DRISDOL) 1.25 MG (50000 UNIT) CAPS capsule Take 50,000 Units by mouth every Thursday.   Yes [provider]  acetaminophen (TYLENOL) 325 MG tablet Take 2 tablets (650 mg total) by mouth every 6 (six) hours as needed for mild pain (or Fever >/= 101). Patient not taking: Reported on 11/19/2021 06/30/21   Terrilee Croak, MD    Inpatient medications:  sodium chloride   Intravenous Once   amLODipine  5 mg Oral Daily   carvedilol  3.125 mg Oral BID WC   Chlorhexidine Gluconate Cloth  6 each Topical Daily   Chlorhexidine Gluconate Cloth  6 each Topical Q0600   docusate sodium  100 mg Oral BID   famotidine  20 mg Oral Daily   feeding supplement  237 mL Oral TID BM   hydrALAZINE  100 mg Oral Q8H   hydroxychloroquine  200 mg Oral Daily   isosorbide mononitrate  30 mg Oral Daily   magnesium oxide  400 mg Oral QPM   multivitamin with minerals  1 tablet Oral Daily   mupirocin ointment  1 application. Nasal BID   pantoprazole  40 mg Oral Daily   polyethylene glycol  17 g Oral TID   rOPINIRole  1 mg Oral QHS   rosuvastatin  40 mg Oral Daily   senna-docusate  2 tablet Oral QHS   sodium bicarbonate  650 mg Oral Daily   sodium chloride flush  3 mL Intravenous Q12H   Vitamin D (Ergocalciferol)  50,000 Units Oral Q Thu    Discontinued Meds:   Medications Discontinued During This Encounter  Medication Reason   lactulose (CHRONULAC) 10 GM/15ML solution 20 g    Capsaicin 0.1 % CREA    megestrol (MEGACE) 40 MG/ML suspension    Chlorhexidine Gluconate Cloth 2 % PADS 6 each    lactulose (CHRONULAC) 10 GM/15ML solution 10 g    polyethylene glycol (MIRALAX / GLYCOLAX) packet 17 g    hydrALAZINE (APRESOLINE) tablet 50 mg    lactated ringers infusion    aspirin EC tablet 81 mg    0.9 %  sodium chloride infusion    apixaban (ELIQUIS) tablet 2.5 mg     Social History:  reports that she has never smoked. She has never used smokeless tobacco. She  reports that she does not drink alcohol and does not use drugs.  Family History:   Family History  Problem Relation Age of Onset   Heart disease Neg Hx     Pertinent items are noted in HPI. Weight change:   Intake/Output Summary (Last 24 hours) at 11/25/2021 1444 Last data filed at 11/25/2021 1300 Gross per 24 hour  Intake 2199.35 ml  Output 350 ml  Net 1849.35 ml   BP (!) 157/101   Pulse 98   Temp 97.9 F (36.6 C) (Oral)   Resp 16   Ht 5' (1.524 m)   Wt 57.8 kg   SpO2 94%   BMI 24.89 kg/m  Vitals:   11/25/21 0137 11/25/21 0434 11/25/21 0822 11/25/21 0849  BP: (!) 154/91 (!) 143/106 (!) 157/101   Pulse: 93 80 (!) 101 98  Resp:  18 16   Temp:  97.8 F (36.6 C) 97.9 F (36.6 C)   TempSrc:  Oral Oral   SpO2: 91% 93% (!) 82% 94%  Weight:      Height:         General appearance: fatigued, pale, and slowed mentation Head: Normocephalic, without obvious abnormality, atraumatic Resp: clear to auscultation bilaterally Cardio: regular rate and rhythm, S1, S2 normal, no murmur, click, rub or gallop GI: soft, non-tender; bowel sounds normal; no masses,  no organomegaly Extremities: edema trace pretibial and pedal edema bilaterally  Labs: Basic Metabolic Panel: Recent Labs  Lab 11/18/21 2026 11/19/21 0926 11/20/21 0930 11/21/21 0706 11/22/21 0444 11/23/21 0511 11/24/21 0328 11/25/21 0241  NA 132* 131* 133* 130* 132* 135 136 136  K 3.5 3.3* 3.6 3.1* 3.5 2.9* 3.5 3.3*  CL 104 105 106 104 105 106 111 110  CO2 18* 15* 15* 17* 19* 19* 15* 17*  GLUCOSE 107* 168* 67* 90 90 97 112* 114*  BUN 55* 52* 48* 43* 37* 29* 27* 28*  CREATININE 4.33* 4.27* 3.81* 3.48* 3.16* 2.99* 2.83* 2.99*  ALBUMIN 2.7* 2.4*  --   --   --   --   --   --  CALCIUM 8.5* 8.1* 8.1* 7.7* 7.9* 7.7* 7.6* 7.9*  PHOS  --  4.4  --   --   --   --   --   --    Liver Function Tests: Recent Labs  Lab 11/18/21 2026 11/19/21 0926  AST 19 19  ALT 13 12  ALKPHOS 70 60  BILITOT 0.5 0.3  PROT 7.4 6.1*   ALBUMIN 2.7* 2.4*   No results for input(s): LIPASE, AMYLASE in the last 168 hours. No results for input(s): AMMONIA in the last 168 hours. CBC: Recent Labs  Lab 11/18/21 2026 11/19/21 0926 11/20/21 0930 11/21/21 0706 11/22/21 0444 11/24/21 0328 11/25/21 0241  WBC 7.8 6.5   < > 6.3 6.3 10.4 9.4  NEUTROABS 6.0 4.6  --   --   --  9.0* 8.1*  HGB 7.1* 6.3*   < > 8.4* 8.9* 8.1* 7.2*  HCT 21.2* 19.6*   < > 24.2* 25.9* 24.2* 21.9*  MCV 89.8 92.9   < > 86.7 88.1 90.0 91.6  PLT 349 301   < > 249 235 240 214   < > = values in this interval not displayed.   PT/INR: @LABRCNTIP (inr:5) Cardiac Enzymes: )No results for input(s): CKTOTAL, CKMB, CKMBINDEX, TROPONINI in the last 168 hours. CBG: No results for input(s): GLUCAP in the last 168 hours.  Iron Studies:  Recent Labs  Lab 11/19/21 0926  IRON 31  TIBC 203*  FERRITIN 189    Xrays/Other Studies: DG Abd 1 View  Result Date: 11/24/2021 CLINICAL DATA:  Abdominal pain and constipation. EXAM: ABDOMEN - 1 VIEW COMPARISON:  Radiographs 08/20/2020.  CT 11/18/2021. FINDINGS: Normal nonobstructive bowel gas pattern. No supine evidence of free intraperitoneal air or bowel wall thickening. Colonic stool burden within normal limits. No suspicious abdominal calcifications. Convex left lumbar scoliosis and multilevel spondylosis. IMPRESSION: No acute abdominal findings.  Lumbar spondylosis. Electronically Signed   By: Richardean Sale M.D.   On: 11/24/2021 14:21     Assessment/Plan:  AKI, non-oliguric - likely combination of ischemic ATN/volume depletion with h/o N/V prior to admission as well as urinary retention from fecal impaction.  Given her history of SLE and active urine sediment will send of acute GN serologies.  She may require a renal biopsy pending results.  Continue to hold losartan and agree with IVF's.  Need to follow bladder scans daily and I&O cath prn. Rectal impaction with stercoral proctitis/ulceration - continue with colace  and miralax. Acute urinary retention - likely due to severe constipation, s/p foley but removed 11/23/21.  Need to follow bladder scans daily and I&O catheter prn. Anemia - iron deficient.  S/p blood transfusion.  No evidence of bleeding on flex sig.  Paroxysmal Atrial fibrillation - new onset but likely source of embolic CVA.  On Eliquis. Chronic diastolic CHF - stable. FTT - per primary svc.   Governor Rooks Dorothia Passmore 11/25/2021, 2:44 PM

## 2021-11-25 NOTE — Evaluation (Signed)
Clinical/Bedside Swallow Evaluation Patient Details  Name: Kim [name] MRN: [id number] Date of Birth: 09-Jun-1946  Today's Date: 11/25/2021 Time: SLP Start Time (ACUTE ONLY): 0847 SLP Stop Time (ACUTE ONLY): 0900 SLP Time Calculation (min) (ACUTE ONLY): 13 min  Past Medical History:  Past Medical History:  Diagnosis Date   AKI (acute kidney injury) ([place]) 11/18/2021   Arthritis    Atrial fibrillation ([place])    Depression    Essential hypertension 06/28/2021   Mixed hyperlipidemia 06/28/2021   Restless leg syndrome 06/28/2021   Stroke [facility])    x2   Past Surgical History:  Past Surgical History:  Procedure Laterality Date   BUBBLE STUDY  08/02/2021   Procedure: BUBBLE STUDY;  Surgeon: Jerline Pain, MD;  Location: [place];  Service: Cardiovascular;;   FLEXIBLE SIGMOIDOSCOPY N/A 11/21/2021   Procedure: Beryle Quant;  Surgeon: [name], MD;  Location: [place];  Service: Gastroenterology;  Laterality: N/A;   LOOP RECORDER INSERTION N/A 08/02/2021   Procedure: LOOP RECORDER INSERTION;  Surgeon: Vickie Epley, MD;  Location: [place] CV [facility];  Service: Cardiovascular;  Laterality: N/A;   TEE WITHOUT CARDIOVERSION N/A 08/02/2021   Procedure: TRANSESOPHAGEAL ECHOCARDIOGRAM (TEE);  Surgeon: Jerline Pain, MD;  Location: [facility] ENDOSCOPY;  Service: Cardiovascular;  Laterality: N/A;   HPI:  76 y.o. female with medical history significant of SLE with rheumatoid arthritis, recent left cerebral artery embolic CVA hospitalized both in December 2022 and once again in January 2023, hypertension, heart failure with preserved EF/diastolic dysfunction and dyslipidemia, presented to the ER with complaints of constipation and rectal pain. Admitted with dx: AKI and is now s/p flexible sigmoidoscopy on 11/21/21    Assessment / Plan / Recommendation  Clinical Impression  Pt with hx of reduced PO intake, fullness, frequent belching per family report. PO trials of all consistencies  were without overt s/sx of aspiration. Pts clinical symptoms may be suggestive of primary esophageal dysphagia. Consider dedicated work up of esophageal per GI & MD discretion such as barium swallow study. No further ST needs identified. Continued regular thin liquid diet with meds as tolerated. SLP Visit Diagnosis: Dysphagia, unspecified (R13.10)    Aspiration Risk  Mild aspiration risk    Diet Recommendation   Regular, thin liquids   Medication Administration: Whole meds with liquid    Other  Recommendations Recommended Consults: Consider esophageal assessment Oral Care Recommendations: Oral care BID    Recommendations for follow up therapy are one component of a multi-disciplinary discharge planning process, led by the attending physician.  Recommendations may be updated based on patient status, additional functional criteria and insurance authorization.  Follow up Recommendations No SLP follow up      Assistance Recommended at Discharge None  Functional Status Assessment    Frequency and Duration  N/a          Prognosis Prognosis for Safe Diet Advancement: Good      Swallow Study   General Date of Onset: 11/18/21 HPI: 76 y.o. female with medical history significant of SLE with rheumatoid arthritis, recent left cerebral artery embolic CVA hospitalized both in December 2022 and once again in January 2023, hypertension, heart failure with preserved EF/diastolic dysfunction and dyslipidemia, presented to the ER with complaints of constipation and rectal pain. Admitted with dx: AKI and is now s/p flexible sigmoidoscopy on 11/21/21 Type of Study: Bedside Swallow Evaluation Previous Swallow Assessment: none on file Diet Prior to this Study: Regular;Thin liquids (vegetarian) Temperature Spikes Noted: No Respiratory Status: Nasal cannula  History of Recent Intubation: No Behavior/Cognition: Alert;Cooperative Oral Cavity Assessment: Within Functional Limits Oral Care Completed by  SLP: No Vision: Functional for self-feeding Self-Feeding Abilities: Able to feed self Patient Positioning: Upright in bed Baseline Vocal Quality: Normal    Oral/Motor/Sensory Function Overall Oral Motor/Sensory Function: Within functional limits   Ice Chips Ice chips: Not tested (does not consume ice)   Thin Liquid Thin Liquid: Within functional limits Presentation: Cup;Straw    Nectar Thick Nectar Thick Liquid: Not tested   Honey Thick Honey Thick Liquid: Not tested   Puree Puree: Within functional limits Presentation: Self Fed   Solid     Solid: Impaired Presentation: Self Fed Oral Phase Impairments:  (expectorated skin of grape)      Hayden Rasmussen. MA, CCC-SLP Acute Rehabilitation Services   11/25/2021,9:26 AM

## 2021-11-25 NOTE — Progress Notes (Signed)
Physical Therapy Treatment Patient Details Name: Kim [name] MRN: [id number] DOB: 07-22-1945 Today's Date: 11/25/2021   History of Present Illness Kim [name] is a 76 y.o. female with medical history significant of SLE with rheumatoid arthritis, recent left cerebral artery embolic CVA hospitalized both in December 2022 and once again in January 2023, hypertension, heart failure with preserved EF/diastolic dysfunction and dyslipidemia, presented to the ER with complaints of constipation and rectal pain. Admitted with dx: AKI and is now s/p flexible sigmoidoscopy on 11/21/21.    PT Comments    Patient continues to be limited by decreased endurance. Patient ambulating on 5L O2 [place] to maintain spO2 >90%. Required extended seated rest break on rollator during mobility. Increased time to recover at end of session prior to returning to supine. Encouraged patient and family to ambulate in hall but ask for assistance to obtain O2 tank. D/c plan remains appropriate.     Recommendations for follow up therapy are one component of a multi-disciplinary discharge planning process, led by the attending physician.  Recommendations may be updated based on patient status, additional functional criteria and insurance authorization.  Follow Up Recommendations  Home health PT     Assistance Recommended at Discharge Set up Supervision/Assistance  Patient can return home with the following A little help with walking and/or transfers;A little help with bathing/dressing/bathroom;Help with stairs or ramp for entrance   Equipment Recommendations  BSC/3in1;Wheelchair (measurements PT);Wheelchair cushion (measurements PT)    Recommendations for Other Services       Precautions / Restrictions Precautions Precautions: Fall Restrictions Weight Bearing Restrictions: No     Mobility  Bed Mobility Overal bed mobility: Needs Assistance Bed Mobility: Sit to Supine       Sit to supine: Min assist   General bed  mobility comments: minA for LE management    Transfers Overall transfer level: Needs assistance Equipment used: Rollator (4 wheels) Transfers: Sit to/from Stand Sit to Stand: Min guard           General transfer comment: cues for locking brakes prior to standing and sitting on rollator    Ambulation/Gait Ambulation/Gait assistance: Min guard Gait Distance (Feet): 75 Feet (+75') Assistive device: Rollator (4 wheels) Gait Pattern/deviations: Step-through pattern, Decreased stride length Gait velocity: decreased     General Gait Details: min guard for safety. Ambulated on 5L O2 to maintain >90%. Seated rest break half way   Stairs             Wheelchair Mobility    Modified Rankin (Stroke Patients Only)       Balance Overall balance assessment: Needs assistance Sitting-balance support: No upper extremity supported, Feet supported Sitting balance-Leahy Scale: Good     Standing balance support: Bilateral upper extremity supported, Reliant on assistive device for balance Standing balance-Leahy Scale: Poor Standing balance comment: reliant on Bryanne Riquelme                            Cognition Arousal/Alertness: Awake/alert Behavior During Therapy: WFL for tasks assessed/performed Overall Cognitive Status: Difficult to assess                                 General Comments: patient following commands with daughter in law interpreting but does understand some english        Exercises      General Comments        Pertinent Vitals/Pain  Pain Assessment Pain Assessment: No/denies pain    Home Living                          Prior Function            PT Goals (current goals can now be found in the care plan section) Acute Rehab PT Goals PT Goal Formulation: With patient Time For Goal Achievement: 12/06/21 Potential to Achieve Goals: Good Progress towards PT goals: Progressing toward goals    Frequency    Min  3X/week      PT Plan Current plan remains appropriate    Co-evaluation              AM-PAC PT "6 Clicks" Mobility   Outcome Measure  Help needed turning from your back to your side while in a flat bed without using bedrails?: None Help needed moving from lying on your back to sitting on the side of a flat bed without using bedrails?: None Help needed moving to and from a bed to a chair (including a wheelchair)?: A Little Help needed standing up from a chair using your arms (e.g., wheelchair or bedside chair)?: A Little Help needed to walk in hospital room?: A Little Help needed climbing 3-5 steps with a railing? : A Little 6 Click Score: 20    End of Session Equipment Utilized During Treatment: Oxygen Activity Tolerance: Patient limited by fatigue Patient left: in bed;with call bell/phone within reach;with family/visitor present Nurse Communication: Mobility status PT Visit Diagnosis: Muscle weakness (generalized) (M62.81)     Time: [id number] PT Time Calculation (min) (ACUTE ONLY): 25 min  Charges:  $Gait Training: 23-37 mins                     Wahneta Derocher A. Gilford Rile PT, DPT Acute Rehabilitation Services Pager [phone] Office [phone]    Linna Hoff 11/25/2021, 4:12 PM

## 2021-11-26 ENCOUNTER — Inpatient Hospital Stay (HOSPITAL_COMMUNITY): Payer: Medicare Other

## 2021-11-26 DIAGNOSIS — N179 Acute kidney failure, unspecified: Secondary | ICD-10-CM | POA: Diagnosis not present

## 2021-11-26 LAB — CBC WITH DIFFERENTIAL/PLATELET
Abs Immature Granulocytes: 0.08 10*3/uL — ABNORMAL HIGH (ref 0.00–0.07)
Basophils Absolute: 0 10*3/uL (ref 0.0–0.1)
Basophils Relative: 0 %
Eosinophils Absolute: 0.2 10*3/uL (ref 0.0–0.5)
Eosinophils Relative: 3 %
HCT: 20 % — ABNORMAL LOW (ref 36.0–46.0)
Hemoglobin: 6.6 g/dL — CL (ref 12.0–15.0)
Immature Granulocytes: 1 %
Lymphocytes Relative: 6 %
Lymphs Abs: 0.4 10*3/uL — ABNORMAL LOW (ref 0.7–4.0)
MCH: 30.1 pg (ref 26.0–34.0)
MCHC: 33 g/dL (ref 30.0–36.0)
MCV: 91.3 fL (ref 80.0–100.0)
Monocytes Absolute: 0.6 10*3/uL (ref 0.1–1.0)
Monocytes Relative: 7 %
Neutro Abs: 6.3 10*3/uL (ref 1.7–7.7)
Neutrophils Relative %: 83 %
Platelets: 206 10*3/uL (ref 150–400)
RBC: 2.19 MIL/uL — ABNORMAL LOW (ref 3.87–5.11)
RDW: 16.9 % — ABNORMAL HIGH (ref 11.5–15.5)
WBC: 7.6 10*3/uL (ref 4.0–10.5)
nRBC: 0 % (ref 0.0–0.2)

## 2021-11-26 LAB — C3 COMPLEMENT: C3 Complement: 105 mg/dL (ref 82–167)

## 2021-11-26 LAB — RENAL FUNCTION PANEL
Albumin: 2.2 g/dL — ABNORMAL LOW (ref 3.5–5.0)
Anion gap: 8 (ref 5–15)
BUN: 30 mg/dL — ABNORMAL HIGH (ref 8–23)
CO2: 19 mmol/L — ABNORMAL LOW (ref 22–32)
Calcium: 7.9 mg/dL — ABNORMAL LOW (ref 8.9–10.3)
Chloride: 110 mmol/L (ref 98–111)
Creatinine, Ser: 2.81 mg/dL — ABNORMAL HIGH (ref 0.44–1.00)
GFR, Estimated: 17 mL/min — ABNORMAL LOW (ref >60)
Glucose, Bld: 103 mg/dL — ABNORMAL HIGH (ref 70–99)
Phosphorus: 3.5 mg/dL (ref 2.5–4.6)
Potassium: 3.4 mmol/L — ABNORMAL LOW (ref 3.5–5.1)
Sodium: 137 mmol/L (ref 135–145)

## 2021-11-26 LAB — ANA W/REFLEX IF POSITIVE: Anti Nuclear Antibody (ANA): NEGATIVE

## 2021-11-26 LAB — PREPARE RBC (CROSSMATCH)

## 2021-11-26 LAB — HEMOGLOBIN AND HEMATOCRIT, BLOOD
HCT: 22.6 % — ABNORMAL LOW (ref 36.0–46.0)
HCT: 26.6 % — ABNORMAL LOW (ref 36.0–46.0)
Hemoglobin: 7.4 g/dL — ABNORMAL LOW (ref 12.0–15.0)
Hemoglobin: 8.7 g/dL — ABNORMAL LOW (ref 12.0–15.0)

## 2021-11-26 LAB — C4 COMPLEMENT: Complement C4, Body Fluid: 16 mg/dL (ref 12–38)

## 2021-11-26 LAB — ANTI-DNA ANTIBODY, DOUBLE-STRANDED: ds DNA Ab: 2 IU/mL (ref 0–9)

## 2021-11-26 LAB — COMPLEMENT, TOTAL: Compl, Total (CH50): >60 U/mL (ref >41)

## 2021-11-26 IMAGING — DX DG CHEST 1V PORT
1 series · 1 of 1 positions shown · non-contrast
Comparison: [DATE]

CLINICAL DATA: Dyspnea, chest pain

EXAM:
PORTABLE CHEST 1 VIEW

[chest]
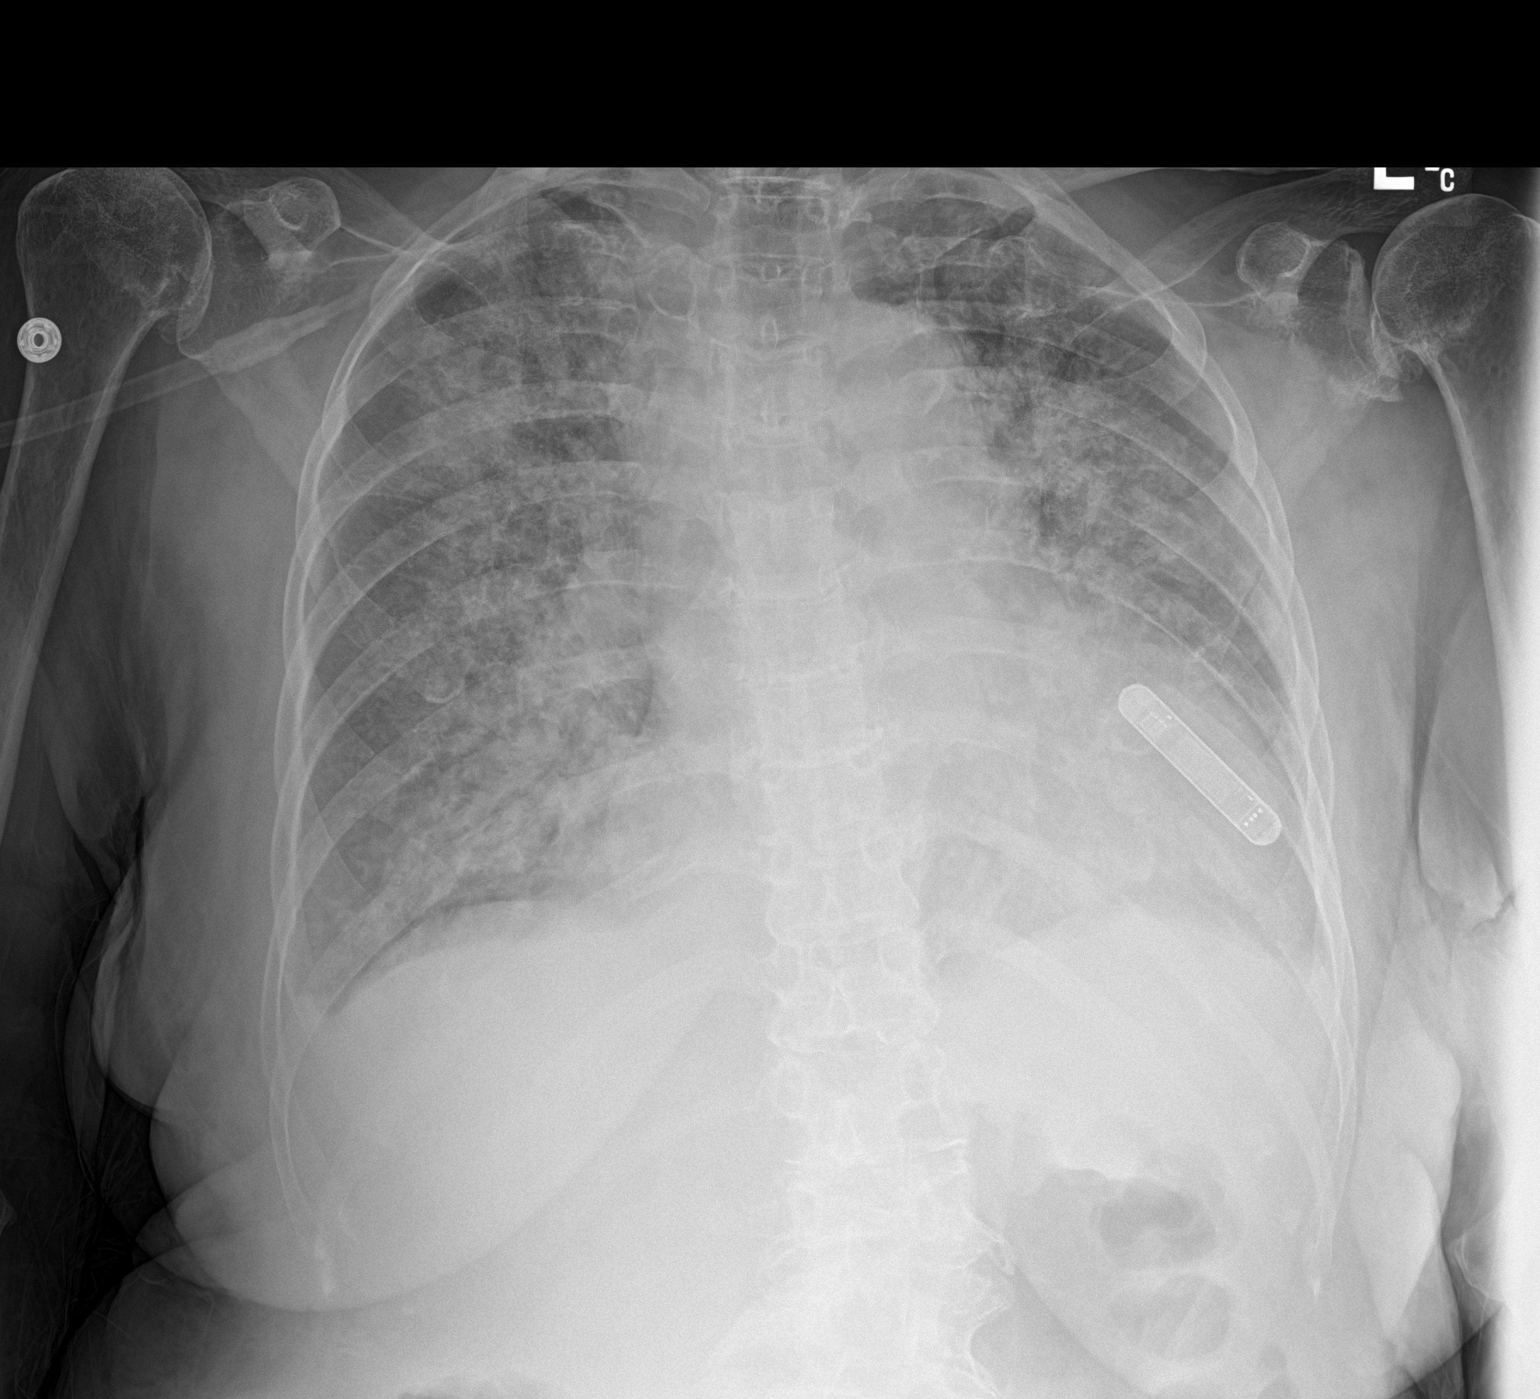

[1 of 1 positions shown; findings below may reference images not displayed]

FINDINGS: Diffuse bilateral interstitial and alveolar airspace opacities.
Trace bilateral pleural effusions. No pneumothorax. Stable
cardiomegaly. No acute osseous abnormality. Osteoarthritis of
bilateral glenohumeral joints.
IMPRESSION: 1. Diffuse bilateral interstitial and alveolar airspace opacities.
Differential considerations include pulmonary edema of versus
multilobar pneumonia versus ARDS.

## 2021-11-26 MED ORDER — SODIUM CHLORIDE 0.9% IV SOLUTION: Freq: Once | INTRAVENOUS | Status: AC

## 2021-11-26 MED ORDER — FUROSEMIDE 10 MG/ML IJ SOLN
40.0000 mg | Freq: Once | INTRAMUSCULAR | Status: AC
  Administered 2021-11-26: 40 mg via INTRAVENOUS
  Filled 2021-11-26: qty 4

## 2021-11-26 MED ORDER — SODIUM BICARBONATE 650 MG PO TABS
650.0000 mg | ORAL_TABLET | Freq: Three times a day (TID) | ORAL | Status: DC
Start: 2021-11-26 — End: 2021-11-29
  Administered 2021-11-26 – 2021-11-29 (×8): 650 mg via ORAL
  Filled 2021-11-26 (×9): qty 1

## 2021-11-26 MED ORDER — POTASSIUM CHLORIDE CRYS ER 20 MEQ PO TBCR
40.0000 meq | EXTENDED_RELEASE_TABLET | Freq: Two times a day (BID) | ORAL | Status: AC
  Administered 2021-11-26 (×2): 40 meq via ORAL
  Filled 2021-11-26 (×2): qty 2

## 2021-11-26 MED ORDER — POTASSIUM CHLORIDE CRYS ER 20 MEQ PO TBCR
20.0000 meq | EXTENDED_RELEASE_TABLET | Freq: Once | ORAL | Status: DC
Start: 2021-11-26 — End: 2021-11-26
  Filled 2021-11-26: qty 1

## 2021-11-26 NOTE — Progress Notes (Signed)
Critical lab called HGB 6.6 DR Margo Aye notified.

## 2021-11-26 NOTE — Progress Notes (Signed)
Re-Consultation    Subjective  Chief Complaint: Anemia  11/19/2021 her team was initially consulted in regards to acute on chronic anemia with fecal impaction and rectal bleeding.  Also discussed she had recently been diagnosed with A-fib and was on Eliquis.  11/21/2021 underwent flex sigmoidoscopy with distal rectum stercoral ulcer without visible vessel recurrent bleeding, exam to the splenic flexure was otherwise normal.  At that time started on a bowel regimen and we signed off.  11/22/2021 Eliquis resumed  11/23/2021 patient was on 3 times daily MiraLAX and Colace and was still complaining of constipation so Senokot was added, noted that she had a hemoglobin drop to less than 7 on 5/12 when she received 2 units of PRBCs, hemoglobin had been remaining stable  11/24/2021 KUB is ordered as she is still not had a bowel movement-returned showing no more retention of stool, noted that hemoglobin was gradually dropping again, also noted to have gross hematuria, but per nephrology/urology this amount of hematuria would not cause that significant drop in hemoglobin   11/19/21 0926 11/20/21 0930 11/20/21 1631 11/21/21 0706 11/22/21 0444 11/24/21 0328  HGB 6.3* 9.0* 9.2* 8.4* 8.9* 8.1*  MCV 92.9 88.0 88.8 86.7 88.1 90.0  VITAMINB12 766  --   --   --   --   --   FOLATE 8.8  --   --   --   --   --   FERRITIN 189  --   --   --   --   --   TIBC 203*  --   --   --   --   --   IRON 31  --   --   --   --   --   RETICCTPCT 2.0  --   --   --   --   --       11/25/2021 patient seen and discussed that her oxygen saturation had dropped to the 80s on ambulation on 11/24/2021 and so her IV fluids were stopped, hemoglobin also continued to drop and was 7.2, patient's Eliquis was held.  She was also started on a PPI.  This morning, patient's hemoglobin was noted to drop down to 6.6 and she was given a unit of PRBCs bring her hemoglobin to 7.4, no signs of overt GI bleeding per daughter.  Otherwise she  notes that she is requiring O2 over the past couple of days which is new for her.  They also describe that the patient has been feeling "full up"/early satiety after eating over the past couple of weeks and they discussed the history of Barrett's esophagus.  No nausea or vomiting.   Objective   Vital signs in last 24 hours: Temp:  [97.6 F (36.4 C)-97.9 F (36.6 C)] 97.6 F (36.4 C) (05/19 1050) Pulse Rate:  [68-78] 68 (05/19 1050) Resp:  [16-18] 16 (05/19 1050) BP: (141-168)/(75-101) 158/86 (05/19 1050) SpO2:  [96 %-98 %] 98 % (05/19 1050) Weight:  [57.8 kg] 57.8 kg (05/18 2125) Last BM Date : 11/26/21 General:    Elderly female in NAD Heart:  Regular rate and rhythm; no murmurs Lungs: Respirations even and unlabored, lungs CTA bilaterally +O2 via [place] at 3L Abdomen:  Soft, nontender and nondistended. Normal bowel sounds. Psych:  Cooperative. Normal mood and affect.  Intake/Output from previous day: 05/18 0701 - 05/19 0700 In: 2123.8 [P.O.:440; I.V.:1571.5; IV Piggyback:112.2] Out: 350 [Urine:350]   Lab Results: Recent Labs    11/24/21 0328 11/25/21 0241 11/26/21 0350  11/26/21 0737  WBC 10.4 9.4 7.6  --   HGB 8.1* 7.2* 6.6* 7.4*  HCT 24.2* 21.9* 20.0* 22.6*  PLT 240 214 206  --    BMET Recent Labs    11/24/21 0328 11/25/21 0241 11/26/21 0350  NA 136 136 137  K 3.5 3.3* 3.4*  CL 111 110 110  CO2 15* 17* 19*  GLUCOSE 112* 114* 103*  BUN 27* 28* 30*  CREATININE 2.83* 2.99* 2.81*  CALCIUM 7.6* 7.9* 7.9*   LFT Recent Labs    11/26/21 0350  ALBUMIN 2.2*    Studies/Results: DG Abd 1 View  Result Date: 11/24/2021 CLINICAL DATA:  Abdominal pain and constipation. EXAM: ABDOMEN - 1 VIEW COMPARISON:  Radiographs 08/20/2020.  CT 11/18/2021. FINDINGS: Normal nonobstructive bowel gas pattern. No supine evidence of free intraperitoneal air or bowel wall thickening. Colonic stool burden within normal limits. No suspicious abdominal calcifications. Convex left lumbar  scoliosis and multilevel spondylosis. IMPRESSION: No acute abdominal findings.  Lumbar spondylosis. Electronically Signed   By: Richardean Sale M.D.   On: 11/24/2021 14:21   DG Chest Port 1 View  Result Date: 11/26/2021 CLINICAL DATA:  Dyspnea, chest pain EXAM: PORTABLE CHEST 1 VIEW COMPARISON:  07/30/2021 FINDINGS: Diffuse bilateral interstitial and alveolar airspace opacities. Trace bilateral pleural effusions. No pneumothorax. Stable cardiomegaly. No acute osseous abnormality. Osteoarthritis of bilateral glenohumeral joints. IMPRESSION: 1. Diffuse bilateral interstitial and alveolar airspace opacities. Differential considerations include pulmonary edema of versus multilobar pneumonia versus ARDS. Electronically Signed   By: Kathreen Devoid M.D.   On: 11/26/2021 11:16      Assessment / Plan:   Assessment: 1.  Anemia: Initially thought from bleeding stercoral ulcer, has been declining over the past few days as above to 6.6 overnight--> 1 unit PRBCs--> 1.4, no overt GI bleeding, Eliquis back on hold; consider upper GI source versus other 2.  Stercoral ulcer: Status post flex sig 11/21/2021, recent abdominal x-ray with no further stool retention, doing well on Senokot and MiraLAX 3.  Acute respiratory failure with hypoxia: New requirement of 2 to 3 L of oxygen over the past 2 to 3 days with chest x-ray as above with question of pulmonary edema versus multilobar pneumonia versus ARDS  4.  AKI with metabolic acidosis 5.  Gross hematuria 6.  A-fib: Last dose of Eliquis 11/25/2021 7.  CHF 8.  Failure to thrive  Plan: 1.  We will tentatively plan for an EGD tomorrow with Dr. Lorenso Courier.  Did discuss risks, benefits, limitations and alternatives with the patient and family and they agreed to proceed. 2.  Will discuss patient's oxygen requirements and chest x-ray with Dr. Lorenso Courier.  Pending her review may delay endoscopy. 3.  Continue to monitor hemoglobin with transfusion as needed less than 7 4.  Can continue  on current diet and will be n.p.o. at midnight.  Thank you for your kind consultation, we will continue to follow.   LOS: 7 days   Levin Erp  11/26/2021, 11:21 AM

## 2021-11-26 NOTE — Progress Notes (Signed)
PROGRESS NOTE  Kim [name]  DOB: [birth year]  PCP: Judd Lien, [place] ZOX:[id number]  DOA: 11/18/2021  LOS: 7 days  Hospital Day: 9  Brief narrative: Kim [name] is a 76 y.o. female with PMH significant for SLE with rheumatoid arthritis, HTN, HLD, diastolic CHF, A-fib, recent left cerebral artery embolic CVA hospitalized both in December 2022 and once again in January 2023. Patient presented to the ED on 5/11 with complaint of constipation and rectal pain ongoing for several days not responding to Dulcolax suppositories. She had implantable cardiac monitor in place after recent stroke and was recently diagnosed with atrial fibrillation and started on Eliquis on 5/2.   Labs in the ED showed an elevated BUN/creatinine of 55/4.33 (baseline normal) and a low sodium of 132 and low serum bicarb level of 18. CT abdomen pelvis showed large volume of stool within the rectal vault with superimposed inflammatory changes suggesting fecal impaction with associated stercoral proctitis and a moderate rectal prolapse.  She was also noted with bladder distention with a volume of 786 cc so a Foley catheter was placed.   Admitted to hospitalist service  Subjective: Patient was seen and examined this morning.  Pleasant elderly female.  Propped up in bed.  Seems dyspneic today on oxygen.  Clear to auscultation however. Labs from this morning with hemoglobin further down to 6.6, creatinine gradually improving.  Principal Problem:   AKI (acute kidney injury) (HCC) Active Problems:   HTN (hypertension)   Mixed hyperlipidemia   Restless leg syndrome   Systemic lupus erythematosus (HCC)   (HFpEF) heart failure with preserved ejection fraction (HCC)   Anemia   Constipation   Stercoral ulcer of anus   Dehydration   Atrial fibrillation, chronic (HCC)   History of cardioembolic cerebrovascular accident (CVA)   Acute kidney injury (HCC)   Fecal impaction (HCC)   Gastrointestinal hemorrhage     Assessment and Plan: Acute kidney injury Metabolic acidosis -Presented with creatinine elevated to 4.3.,  Baseline creatinine less than 1.  Creatinine level improving but in a fluctuating course.  2.81 today.   -Nephrology following.   -Serum bicarb level improving.  Sodium bicarbonate tablet to continue. Recent Labs    08/01/21 0227 11/18/21 2026 11/19/21 0926 11/20/21 0930 11/21/21 0706 11/22/21 0444 11/23/21 0511 11/24/21 0328 11/25/21 0241 11/26/21 0350  BUN 16 55* 52* 48* 43* 37* 29* 27* 28* 30*  CREATININE 0.62 4.33* 4.27* 3.81* 3.48* 3.16* 2.99* 2.83* 2.99* 2.81*  CO2 23 18* 15* 15* 17* 19* 19* 15* 17* 19*     Hypokalemia/hypomagnesemia -Potassium 3.4 today.  Replacement given. Recent Labs  Lab 11/22/21 0444 11/23/21 0511 11/24/21 0328 11/25/21 0241 11/26/21 0350  K 3.5 2.9* 3.5 3.3* 3.4*  MG  --  1.5* 2.1  --   --   PHOS  --   --   --   --  3.5     Acute blood loss anemia/possible rectal bleeding:  -Patient has mild hematuria but hemoglobin drop is significant and is probably from another source.  Hemoglobin 6.6 this morning.  Blood transfusion ordered.   -GI consult requested for potential need of EGD.  Patient has history of Barrett's esophagus. -Eliquis on hold.  Continue PPI. -Monitor hemoglobin Recent Labs    11/19/21 0926 11/20/21 0930 11/22/21 0444 11/24/21 0328 11/25/21 0241 11/26/21 0350 11/26/21 0737  HGB 6.3*   < > 8.9* 8.1* 7.2* 6.6* 7.4*  MCV 92.9   < > 88.1 90.0 91.6 91.3  --   VITAMINB12  766  --   --   --   --   --   --   FOLATE 8.8  --   --   --   --   --   --   FERRITIN 189  --   --   --   --   --   --   TIBC 203*  --   --   --   --   --   --   IRON 31  --   --   --   --   --   --   RETICCTPCT 2.0  --   --   --   --   --   --    < > = values in this interval not displayed.    Acute urinary retention Gross hematuria -On admission, she had a urine retention of more than 700 mL.  Likely due to severe constipation.  Urinalysis  also showed hematuria.  She had Foley catheter in place which were successfully removed on 5/15.  Voiding trial successful.   -5/17, urinalysis was repeated which showed yellow urine but large amount of hemoglobin and RBC.  5/19, discussed with urologist Dr. Alvester Morin.  Without evidence of stone or mass in the CT abdomen and pelvis, he doubts that patient needs any urological intervention at this time.  While her bladder was distended, she might have tore some capillaries leading to hematuria.  Continue to monitor.    Rectal impaction with stercoral proctitis/focal constipation -Primary complaint of constipation.  Partially disimpacted by EDP with some blood noted during disimpaction.  Seen by GI.  S/p flexible sigmoidoscopy on 11/21/2021 which showed stercoral distal rectal ulcer without visible vessel or current bleeding.  Otherwise normal.   -GI recommended avoiding constipation with daily Senokot, MiraLAX. -5/17, abdominal x-ray with no more retention stool.  Acute respiratory failure with hypoxia -Requiring 2 to 3 L oxygen at baseline.  Dyspnea worsened by anemia.  Continue to wean down as tolerated.  Obtain chest x-ray today.   Paroxysmal atrial fibrillation -Currently rate controlled on low-dose Coreg. -Eliquis was initially held.  Resumed on 5/15.  With worsening anemia, I stopped Eliquis again on 5/18.   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  After the cardiac monitor showed A-fib, she was switched to Eliquis on 5/2 by her outpatient cardiologist  Chronic diastolic CHF Essential hypertension -Currently on Coreg, amlodipine, Imdur, hydralazine.  Losartan on hold. -Continue to monitor blood pressure.  Failure to thrive in adult -Previously had been on Megace without any response to this medication so it was discontinued according to daughter -Patient encouraged to drink more fluids as opposed to eating significant volumes of food.      SLE/RA Continue Plaquenil  Goals of care   Code Status: Full Code    Mobility: Encourage ambulation  Skin assessment:     Nutritional status:  Body mass index is 24.88 kg/m.  Nutrition Problem: Inadequate oral intake Etiology: poor appetite Signs/Symptoms: per patient/family report     Diet:  Diet Order             Diet vegetarian Room service appropriate? Yes; Fluid consistency: Thin  Diet effective now                   DVT prophylaxis:  SCDs Start: 11/19/21 0743   Antimicrobials: None Fluid: NS stopped today by nephrology. Consultants: GI, nephrology Family Communication: Daughter on the  phone, daughter-in-law at bedside  Status is: Inpatient  Continue in-hospital care because: Gradually improving renal function Level of care: Med-Surg   Dispo: The patient is from: Home              Anticipated d/c is to: Home in 2 to 3 days              Patient currently is not medically stable to d/c.   Difficult to place patient No     Infusions:   sodium chloride 100 mL/hr at 11/26/21 0220   cefTRIAXone (ROCEPHIN)  IV 1 g (11/26/21 1044)    Scheduled Meds:  sodium chloride   Intravenous Once   amLODipine  5 mg Oral Daily   carvedilol  3.125 mg Oral BID WC   Chlorhexidine Gluconate Cloth  6 each Topical Daily   Chlorhexidine Gluconate Cloth  6 each Topical Q0600   docusate sodium  100 mg Oral BID   famotidine  20 mg Oral Daily   feeding supplement  237 mL Oral TID BM   hydrALAZINE  100 mg Oral Q8H   hydroxychloroquine  200 mg Oral Daily   isosorbide mononitrate  30 mg Oral Daily   magnesium oxide  400 mg Oral QPM   multivitamin with minerals  1 tablet Oral Daily   pantoprazole  40 mg Oral Daily   polyethylene glycol  17 g Oral TID   potassium chloride  20 mEq Oral Once   rOPINIRole  1 mg Oral QHS   rosuvastatin  40 mg Oral Daily   senna-docusate  2 tablet Oral QHS   sodium bicarbonate  650 mg Oral Daily   sodium chloride flush  3 mL Intravenous  Q12H   Vitamin D (Ergocalciferol)  50,000 Units Oral Q Thu    PRN meds: acetaminophen **OR** acetaminophen, lip balm, ondansetron **OR** ondansetron (ZOFRAN) IV, traMADol   Antimicrobials: Anti-infectives (From admission, onward)    Start     Dose/Rate Route Frequency Ordered Stop   11/25/21 1030  cefTRIAXone (ROCEPHIN) 1 g in sodium chloride 0.9 % 100 mL IVPB        1 g 200 mL/hr over 30 Minutes Intravenous Every 24 hours 11/25/21 0933     11/19/21 1000  hydroxychloroquine (PLAQUENIL) tablet 200 mg        200 mg Oral Daily 11/19/21 0750         Objective: Vitals:   11/26/21 0913 11/26/21 1050  BP: (!) 168/98 (!) 158/86  Pulse: 74 68  Resp: 18 16  Temp:  97.6 F (36.4 C)  SpO2: 97% 98%    Intake/Output Summary (Last 24 hours) at 11/26/2021 1115 Last data filed at 11/26/2021 0220 Gross per 24 hour  Intake 1903.75 ml  Output 350 ml  Net 1553.75 ml    Filed Weights   11/23/21 0500 11/24/21 0454 11/25/21 2125  Weight: 56.4 kg 57.8 kg 57.8 kg   Weight change:  Body mass index is 24.88 kg/m.   Physical Exam: General exam: Pleasant, elderly Caucasian female.  Not in physical distress Skin: No rashes, lesions or ulcers. HEENT: Atraumatic, normocephalic, no obvious bleeding Lungs: Dyspneic this morning but clear to auscultation bilaterally. CVS: Regular rate and rhythm, no murmur GI/Abd soft, nontender, nondistended, bowel sound present CNS: Alert, awake, oriented x3 Psychiatry: Mood appropriate Extremities: Trace pedal edema on the right, no calf tenderness  Data Review: I have personally reviewed the laboratory data and studies available.  F/u labs ordered Unresulted Labs (From admission, onward)  Start     Ordered   11/26/21 0500  Renal function panel  Daily,   R      11/25/21 1519   11/26/21 0500  UPEP/UIFE/Light Chains/TP, 24-Hr Ur  Tomorrow morning,   R        11/25/21 1750   11/25/21 1519  Protein electrophoresis, serum  Once,   R        11/25/21  1518   11/25/21 1518  ANCA Titers  (Anti-Neutrophilic Cystoplasmic Antibody Panel (PNL))  Once,   R        11/25/21 1518   11/25/21 0500  CBC with Differential/Platelet  Daily,   R      11/24/21 1154   11/25/21 0500  Basic metabolic panel  Daily,   R      11/24/21 1154            Signed, Lorin Glass, MD [facility] 11/26/2021

## 2021-11-26 NOTE — Progress Notes (Addendum)
Kim [name]  Assessment/ Plan: Pt is a 76 y.o. yo female   with a PMH significant for SLE, rheumatoid arthritis, HDN, HLD, chronic diastolic CHF, left cerebral artery embolic CVA, who presented to [facility] ED on 11/18/21 complaining of constipation, rectal pain, N/V and not eating much, seen as a consultation for AKI.  Hospital course complicated by acute renal retention, stercoral proctitis and severe anemia requiring blood transfusion.  #Acute kidney injury, nonoliguric likely due to ischemic ATN/volume depletion with h/o N/V, reduced oral intake, acute urine retention from fecal impaction, UTI and severe anemia.  UA with some protein and UTI.  CT scan reports distended bladder but unremarkable kidneys.   Serum creatinine level is trending down with IV hydration.  She was short of breath this morning therefore I will discontinue IV fluid. Urine output marginal, continue bladder scan. Serologies including ANA, anti-dsDNA, C3, C4 negative.  ANCA and protein electrophoresis pending. Getting blood transfusion for anemia Continue Foley catheter for acute urine retention Continue to hold losartan Ceftriaxone for UTI Strict ins and out, daily lab and watch for renal recovery.  #Severe anemia/acute blood loss anemia/rectal bleeding: Received 2 units of blood transfusion with initial improvement of hemoglobin.  Flexible sigmoidoscopy on 5/14 with distal rectal stercoral ulcer.  Hemoglobin dropped down today therefore plan for another blood transfusion.  Eliquis on hold and currently on PPI.  #Acute urinary retention likely due to severe constipation status post Foley catheter which was later removed.  Continue bladder scan.  #Metabolic acidosis: Increase sodium bicarbonate to 3 times daily.  Monitor CO2 level.  #Hypokalemia: Replete potassium chloride.  #Rectal impaction with stercoral proctitis/constipation: Status post flexible sigmoidoscopy.  Stool softener per  primary team.  Addendum: CXR reviewed. Getting PRBC, will order a dose of lasix 40 mg IV.   Subjective: Seen and examined at bedside.  Patient was complaining of shortness of breath therefore IV fluid was discontinued immediately.  She feels fatigue and tired.  Denies nausea, vomiting, chest pain.  Her husband and daughter-in-law at were present at the bedside. Objective Vital signs in last 24 hours: Vitals:   11/25/21 2125 11/26/21 0558 11/26/21 0913 11/26/21 1050  BP: (!) 142/101 (!) 159/91 (!) 168/98 (!) 158/86  Pulse: 74 69 74 68  Resp: 18 18 18 16   Temp: 97.9 F (36.6 C)   97.6 F (36.4 C)  TempSrc: Oral   Oral  SpO2: 96% 96% 97% 98%  Weight: 57.8 kg     Height:       Weight change:   Intake/Output Summary (Last 24 hours) at 11/26/2021 1113 Last data filed at 11/26/2021 0220 Gross per 24 hour  Intake 1903.75 ml  Output 350 ml  Net 1553.75 ml       Labs: Basic Metabolic Panel: Recent Labs  Lab 11/24/21 0328 11/25/21 0241 11/26/21 0350  NA 136 136 137  K 3.5 3.3* 3.4*  CL 111 110 110  CO2 15* 17* 19*  GLUCOSE 112* 114* 103*  BUN 27* 28* 30*  CREATININE 2.83* 2.99* 2.81*  CALCIUM 7.6* 7.9* 7.9*  PHOS  --   --  3.5   Liver Function Tests: Recent Labs  Lab 11/26/21 0350  ALBUMIN 2.2*   No results for input(s): LIPASE, AMYLASE in the last 168 hours. No results for input(s): AMMONIA in the last 168 hours. CBC: Recent Labs  Lab 11/21/21 0706 11/22/21 0444 11/24/21 0328 11/25/21 0241 11/26/21 0350 11/26/21 0737  WBC 6.3 6.3 10.4 9.4 7.6  --  NEUTROABS  --   --  9.0* 8.1* 6.3  --   HGB 8.4* 8.9* 8.1* 7.2* 6.6* 7.4*  HCT 24.2* 25.9* 24.2* 21.9* 20.0* 22.6*  MCV 86.7 88.1 90.0 91.6 91.3  --   PLT 249 235 240 214 206  --    Cardiac Enzymes: No results for input(s): CKTOTAL, CKMB, CKMBINDEX, TROPONINI in the last 168 hours. CBG: No results for input(s): GLUCAP in the last 168 hours.  Iron Studies: No results for input(s): IRON, TIBC, TRANSFERRIN,  FERRITIN in the last 72 hours. Studies/Results: DG Abd 1 View  Result Date: 11/24/2021 CLINICAL DATA:  Abdominal pain and constipation. EXAM: ABDOMEN - 1 VIEW COMPARISON:  Radiographs 08/20/2020.  CT 11/18/2021. FINDINGS: Normal nonobstructive bowel gas pattern. No supine evidence of free intraperitoneal air or bowel wall thickening. Colonic stool burden within normal limits. No suspicious abdominal calcifications. Convex left lumbar scoliosis and multilevel spondylosis. IMPRESSION: No acute abdominal findings.  Lumbar spondylosis. Electronically Signed   By: Richardean Sale M.D.   On: 11/24/2021 14:21    Medications: Infusions:  sodium chloride 100 mL/hr at 11/26/21 0220   cefTRIAXone (ROCEPHIN)  IV 1 g (11/26/21 1044)    Scheduled Medications:  sodium chloride   Intravenous Once   amLODipine  5 mg Oral Daily   carvedilol  3.125 mg Oral BID WC   Chlorhexidine Gluconate Cloth  6 each Topical Daily   Chlorhexidine Gluconate Cloth  6 each Topical Q0600   docusate sodium  100 mg Oral BID   famotidine  20 mg Oral Daily   feeding supplement  237 mL Oral TID BM   hydrALAZINE  100 mg Oral Q8H   hydroxychloroquine  200 mg Oral Daily   isosorbide mononitrate  30 mg Oral Daily   magnesium oxide  400 mg Oral QPM   multivitamin with minerals  1 tablet Oral Daily   pantoprazole  40 mg Oral Daily   polyethylene glycol  17 g Oral TID   potassium chloride  20 mEq Oral Once   rOPINIRole  1 mg Oral QHS   rosuvastatin  40 mg Oral Daily   senna-docusate  2 tablet Oral QHS   sodium bicarbonate  650 mg Oral Daily   sodium chloride flush  3 mL Intravenous Q12H   Vitamin D (Ergocalciferol)  50,000 Units Oral Q Thu    have reviewed scheduled and prn medications.  Physical Exam: General:NAD, comfortable Heart:RRR, s1s2 nl Lungs:clear b/l, no crackle Abdomen:soft, Non-tender, non-distended Extremities:No edema Neurology: Alert, awake  Kim [name] Kim [name] 11/26/2021,11:13 AM  LOS: 7 days

## 2021-11-26 NOTE — H&P (View-Only) (Signed)
  Re-Consultation    Subjective  Chief Complaint: Anemia  11/19/2021 her team was initially consulted in regards to acute on chronic anemia with fecal impaction and rectal bleeding.  Also discussed she had recently been diagnosed with A-fib and was on Eliquis.  11/21/2021 underwent flex sigmoidoscopy with distal rectum stercoral ulcer without visible vessel recurrent bleeding, exam to the splenic flexure was otherwise normal.  At that time started on a bowel regimen and we signed off.  11/22/2021 Eliquis resumed  11/23/2021 patient was on 3 times daily MiraLAX and Colace and was still complaining of constipation so Senokot was added, noted that she had a hemoglobin drop to less than 7 on 5/12 when she received 2 units of PRBCs, hemoglobin had been remaining stable  11/24/2021 KUB is ordered as she is still not had a bowel movement-returned showing no more retention of stool, noted that hemoglobin was gradually dropping again, also noted to have gross hematuria, but per nephrology/urology this amount of hematuria would not cause that significant drop in hemoglobin   11/19/21 0926 11/20/21 0930 11/20/21 1631 11/21/21 0706 11/22/21 0444 11/24/21 0328  HGB 6.3* 9.0* 9.2* 8.4* 8.9* 8.1*  MCV 92.9 88.0 88.8 86.7 88.1 90.0  VITAMINB12 766  --   --   --   --   --   FOLATE 8.8  --   --   --   --   --   FERRITIN 189  --   --   --   --   --   TIBC 203*  --   --   --   --   --   IRON 31  --   --   --   --   --   RETICCTPCT 2.0  --   --   --   --   --       11/25/2021 patient seen and discussed that her oxygen saturation had dropped to the 80s on ambulation on 11/24/2021 and so her IV fluids were stopped, hemoglobin also continued to drop and was 7.2, patient's Eliquis was held.  She was also started on a PPI.  This morning, patient's hemoglobin was noted to drop down to 6.6 and she was given a unit of PRBCs bring her hemoglobin to 7.4, no signs of overt GI bleeding per daughter.  Otherwise she  notes that she is requiring O2 over the past couple of days which is new for her.  They also describe that the patient has been feeling "full up"/early satiety after eating over the past couple of weeks and they discussed the history of Barrett's esophagus.  No nausea or vomiting.   Objective   Vital signs in last 24 hours: Temp:  [97.6 F (36.4 C)-97.9 F (36.6 C)] 97.6 F (36.4 C) (05/19 1050) Pulse Rate:  [68-78] 68 (05/19 1050) Resp:  [16-18] 16 (05/19 1050) BP: (141-168)/(75-101) 158/86 (05/19 1050) SpO2:  [96 %-98 %] 98 % (05/19 1050) Weight:  [57.8 kg] 57.8 kg (05/18 2125) Last BM Date : 11/26/21 General:    Elderly female in NAD Heart:  Regular rate and rhythm; no murmurs Lungs: Respirations even and unlabored, lungs CTA bilaterally +O2 via [place] at 3L Abdomen:  Soft, nontender and nondistended. Normal bowel sounds. Psych:  Cooperative. Normal mood and affect.  Intake/Output from previous day: 05/18 0701 - 05/19 0700 In: 2123.8 [P.O.:440; I.V.:1571.5; IV Piggyback:112.2] Out: 350 [Urine:350]   Lab Results: Recent Labs    11/24/21 0328 11/25/21 0241 11/26/21 0350   11/26/21 0737  WBC 10.4 9.4 7.6  --   HGB 8.1* 7.2* 6.6* 7.4*  HCT 24.2* 21.9* 20.0* 22.6*  PLT 240 214 206  --    BMET Recent Labs    11/24/21 0328 11/25/21 0241 11/26/21 0350  NA 136 136 137  K 3.5 3.3* 3.4*  CL 111 110 110  CO2 15* 17* 19*  GLUCOSE 112* 114* 103*  BUN 27* 28* 30*  CREATININE 2.83* 2.99* 2.81*  CALCIUM 7.6* 7.9* 7.9*   LFT Recent Labs    11/26/21 0350  ALBUMIN 2.2*    Studies/Results: DG Abd 1 View  Result Date: 11/24/2021 CLINICAL DATA:  Abdominal pain and constipation. EXAM: ABDOMEN - 1 VIEW COMPARISON:  Radiographs 08/20/2020.  CT 11/18/2021. FINDINGS: Normal nonobstructive bowel gas pattern. No supine evidence of free intraperitoneal air or bowel wall thickening. Colonic stool burden within normal limits. No suspicious abdominal calcifications. Convex left lumbar  scoliosis and multilevel spondylosis. IMPRESSION: No acute abdominal findings.  Lumbar spondylosis. Electronically Signed   By: Richardean Sale M.D.   On: 11/24/2021 14:21   DG Chest Port 1 View  Result Date: 11/26/2021 CLINICAL DATA:  Dyspnea, chest pain EXAM: PORTABLE CHEST 1 VIEW COMPARISON:  07/30/2021 FINDINGS: Diffuse bilateral interstitial and alveolar airspace opacities. Trace bilateral pleural effusions. No pneumothorax. Stable cardiomegaly. No acute osseous abnormality. Osteoarthritis of bilateral glenohumeral joints. IMPRESSION: 1. Diffuse bilateral interstitial and alveolar airspace opacities. Differential considerations include pulmonary edema of versus multilobar pneumonia versus ARDS. Electronically Signed   By: Kathreen Devoid M.D.   On: 11/26/2021 11:16      Assessment / Plan:   Assessment: 1.  Anemia: Initially thought from bleeding stercoral ulcer, has been declining over the past few days as above to 6.6 overnight--> 1 unit PRBCs--> 1.4, no overt GI bleeding, Eliquis back on hold; consider upper GI source versus other 2.  Stercoral ulcer: Status post flex sig 11/21/2021, recent abdominal x-ray with no further stool retention, doing well on Senokot and MiraLAX 3.  Acute respiratory failure with hypoxia: New requirement of 2 to 3 L of oxygen over the past 2 to 3 days with chest x-ray as above with question of pulmonary edema versus multilobar pneumonia versus ARDS  4.  AKI with metabolic acidosis 5.  Gross hematuria 6.  A-fib: Last dose of Eliquis 11/25/2021 7.  CHF 8.  Failure to thrive  Plan: 1.  We will tentatively plan for an EGD tomorrow with Dr. Lorenso Courier.  Did discuss risks, benefits, limitations and alternatives with the patient and family and they agreed to proceed. 2.  Will discuss patient's oxygen requirements and chest x-ray with Dr. Lorenso Courier.  Pending her review may delay endoscopy. 3.  Continue to monitor hemoglobin with transfusion as needed less than 7 4.  Can continue  on current diet and will be n.p.o. at midnight.  Thank you for your kind consultation, we will continue to follow.   LOS: 7 days   Levin Erp  11/26/2021, 11:21 AM

## 2021-11-26 NOTE — Progress Notes (Signed)
Occupational Therapy Treatment Patient Details Name: Kim [name] MRN: [id number] DOB: Jan 26, 1946 Today's Date: 11/26/2021   History of present illness Kim [name] is a 76 y.o. female with medical history significant of SLE with rheumatoid arthritis, recent left cerebral artery embolic CVA hospitalized both in December 2022 and once again in January 2023, hypertension, heart failure with preserved EF/diastolic dysfunction and dyslipidemia, presented to the ER with complaints of constipation and rectal pain. Admitted with dx: AKI and is now s/p flexible sigmoidoscopy on 11/21/21.   OT comments  Pt making progress with OT goals. She presents with increased activity tolerance this session, as well as balance. Requiring assist for LB bathing/pericare and to don shoes. OT will continue to follow acutely.   Recommendations for follow up therapy are one component of a multi-disciplinary discharge planning process, led by the attending physician.  Recommendations may be updated based on patient status, additional functional criteria and insurance authorization.    Follow Up Recommendations  No OT follow up    Assistance Recommended at Discharge Intermittent Supervision/Assistance  Patient can return home with the following  A little help with bathing/dressing/bathroom;Assistance with cooking/housework;A little help with walking and/or transfers   Equipment Recommendations  BSC/3in1    Recommendations for Other Services      Precautions / Restrictions Precautions Precautions: Fall Restrictions Weight Bearing Restrictions: No       Mobility Bed Mobility Overal bed mobility: Needs Assistance Bed Mobility: Supine to Sit     Supine to sit: Min assist     General bed mobility comments: minA for trunk elevation    Transfers Overall transfer level: Needs assistance Equipment used: Rollator (4 wheels) Transfers: Sit to/from Stand Sit to Stand: Min guard           General  transfer comment: for safety     Balance Overall balance assessment: Needs assistance Sitting-balance support: No upper extremity supported, Feet supported Sitting balance-Leahy Scale: Good     Standing balance support: Bilateral upper extremity supported, Reliant on assistive device for balance Standing balance-Leahy Scale: Poor Standing balance comment: reliant on walker                           ADL either performed or assessed with clinical judgement   ADL Overall ADL's : Needs assistance/impaired     Grooming: Wash/dry hands;Supervision/safety;Standing Grooming Details (indicate cue type and reason): completed standing at the sink     Lower Body Bathing: Moderate assistance;Sit to/from stand Lower Body Bathing Details (indicate cue type and reason): Pt unable to complete pericare standing up from toilet, focusing on balance     Lower Body Dressing: Moderate assistance;Sitting/lateral leans Lower Body Dressing Details (indicate cue type and reason): Needs assist for donning shoes, may try long handled shoe horn next time Toilet Transfer: Min guard;Ambulation Toilet Transfer Details (indicate cue type and reason): Min guard for safety and due to lones Toileting- Clothing Manipulation and Hygiene: Moderate assistance;Sitting/lateral lean;Sit to/from stand Toileting - Clothing Manipulation Details (indicate cue type and reason): needed assist for pericare in standing     Functional mobility during ADLs: Min guard;Rolling walker (2 wheels) General ADL Comments: Pt with improved activity tolernce this session, still needs assist due to balance and weakness    Extremity/Trunk Assessment              Vision       Perception     Praxis      Cognition Arousal/Alertness: Awake/alert  Behavior During Therapy: WFL for tasks assessed/performed Overall Cognitive Status: Within Functional Limits for tasks assessed                                           Exercises Exercises: Other exercises Other Exercises Other Exercises: seated marching x10 Other Exercises: seated kickouts x10 Other Exercises: seated heel raises x10 Other Exercises: seated toe raises x10    Shoulder Instructions       General Comments VSS on 2L, satting at 98%, took pt off O2 and satting at 96%, left off O2, RN aware    Pertinent Vitals/ Pain       Pain Assessment Pain Assessment: No/denies pain  Home Living                                          Prior Functioning/Environment              Frequency  Min 2X/week        Progress Toward Goals  OT Goals(current goals can now be found in the care plan section)  Progress towards OT goals: Progressing toward goals  Acute Rehab OT Goals Patient Stated Goal: To go home OT Goal Formulation: With patient/family Time For Goal Achievement: 12/06/21 Potential to Achieve Goals: Good ADL Goals Pt Will Perform Lower Body Dressing: sitting/lateral leans;sit to/from stand;with supervision;with min guard assist Pt Will Transfer to Toilet: with modified independence;ambulating;bedside commode Pt Will Perform Toileting - Clothing Manipulation and hygiene: with modified independence;sitting/lateral leans;sit to/from stand  Plan Discharge plan remains appropriate;Frequency remains appropriate    Co-evaluation                 AM-PAC OT "6 Clicks" Daily Activity     Outcome Measure   Help from another person eating meals?: None Help from another person taking care of personal grooming?: A Little Help from another person toileting, which includes using toliet, bedpan, or urinal?: A Lot Help from another person bathing (including washing, rinsing, drying)?: A Lot Help from another person to put on and taking off regular upper body clothing?: None Help from another person to put on and taking off regular lower body clothing?: A Lot 6 Click Score: 17    End of Session  Equipment Utilized During Treatment: Rolling walker (2 wheels);Oxygen  OT Visit Diagnosis: Unsteadiness on feet (R26.81);Pain   Activity Tolerance Patient tolerated treatment well   Patient Left in bed;with call bell/phone within reach;with family/visitor present   Nurse Communication Mobility status        Time: 5366-4403 OT Time Calculation (min): 31 min  Charges: OT General Charges $OT Visit: 1 Visit OT Treatments $Self Care/Home Management : 8-22 mins $Therapeutic Activity: 8-22 mins Kim [name] H., OTR/L Acute Rehabilitation   Kamora Vossler Elane Gwenneth Whiteman 11/26/2021, 6:12 PM

## 2021-11-26 NOTE — Progress Notes (Signed)
Per Nephrology, only 1 unit of blood administered. Pt was fluid overloaded and cont. Fluids were stopped. Will check H&H and see if second unit of blood is indicated.

## 2021-11-27 ENCOUNTER — Encounter (HOSPITAL_COMMUNITY): Payer: Self-pay | Admitting: Internal Medicine

## 2021-11-27 ENCOUNTER — Inpatient Hospital Stay (HOSPITAL_COMMUNITY): Payer: Medicare Other | Admitting: Anesthesiology

## 2021-11-27 ENCOUNTER — Encounter (HOSPITAL_COMMUNITY)
Admission: EM | Disposition: A | Discharge status: Home Health | Payer: Self-pay | Source: Home / Self Care | Attending: Internal Medicine

## 2021-11-27 DIAGNOSIS — K2970 Gastritis, unspecified, without bleeding: Secondary | ICD-10-CM

## 2021-11-27 DIAGNOSIS — N179 Acute kidney failure, unspecified: Secondary | ICD-10-CM | POA: Diagnosis not present

## 2021-11-27 DIAGNOSIS — R6881 Early satiety: Secondary | ICD-10-CM

## 2021-11-27 DIAGNOSIS — K2289 Other specified disease of esophagus: Secondary | ICD-10-CM

## 2021-11-27 DIAGNOSIS — K449 Diaphragmatic hernia without obstruction or gangrene: Secondary | ICD-10-CM

## 2021-11-27 DIAGNOSIS — D649 Anemia, unspecified: Secondary | ICD-10-CM

## 2021-11-27 HISTORY — PX: BIOPSY: SHX5522

## 2021-11-27 HISTORY — PX: ESOPHAGOGASTRODUODENOSCOPY (EGD) WITH PROPOFOL: SHX5813

## 2021-11-27 LAB — CBC WITH DIFFERENTIAL/PLATELET
Abs Immature Granulocytes: 0.07 10*3/uL (ref 0.00–0.07)
Basophils Absolute: 0 10*3/uL (ref 0.0–0.1)
Basophils Relative: 0 %
Eosinophils Absolute: 0.3 10*3/uL (ref 0.0–0.5)
Eosinophils Relative: 5 %
HCT: 25.1 % — ABNORMAL LOW (ref 36.0–46.0)
Hemoglobin: 8.6 g/dL — ABNORMAL LOW (ref 12.0–15.0)
Immature Granulocytes: 1 %
Lymphocytes Relative: 6 %
Lymphs Abs: 0.5 10*3/uL — ABNORMAL LOW (ref 0.7–4.0)
MCH: 30.3 pg (ref 26.0–34.0)
MCHC: 34.3 g/dL (ref 30.0–36.0)
MCV: 88.4 fL (ref 80.0–100.0)
Monocytes Absolute: 0.6 10*3/uL (ref 0.1–1.0)
Monocytes Relative: 9 %
Neutro Abs: 5.8 10*3/uL (ref 1.7–7.7)
Neutrophils Relative %: 79 %
Platelets: 235 10*3/uL (ref 150–400)
RBC: 2.84 MIL/uL — ABNORMAL LOW (ref 3.87–5.11)
RDW: 16.4 % — ABNORMAL HIGH (ref 11.5–15.5)
WBC: 7.3 10*3/uL (ref 4.0–10.5)
nRBC: 0 % (ref 0.0–0.2)

## 2021-11-27 LAB — RENAL FUNCTION PANEL
Albumin: 2.3 g/dL — ABNORMAL LOW (ref 3.5–5.0)
Anion gap: 10 (ref 5–15)
BUN: 31 mg/dL — ABNORMAL HIGH (ref 8–23)
CO2: 20 mmol/L — ABNORMAL LOW (ref 22–32)
Calcium: 8.6 mg/dL — ABNORMAL LOW (ref 8.9–10.3)
Chloride: 108 mmol/L (ref 98–111)
Creatinine, Ser: 2.74 mg/dL — ABNORMAL HIGH (ref 0.44–1.00)
GFR, Estimated: 17 mL/min — ABNORMAL LOW (ref >60)
Glucose, Bld: 92 mg/dL (ref 70–99)
Phosphorus: 3.4 mg/dL (ref 2.5–4.6)
Potassium: 3.8 mmol/L (ref 3.5–5.1)
Sodium: 138 mmol/L (ref 135–145)

## 2021-11-27 SURGERY — ESOPHAGOGASTRODUODENOSCOPY (EGD) WITH PROPOFOL
Anesthesia: Monitor Anesthesia Care

## 2021-11-27 MED ORDER — PROPOFOL 500 MG/50ML IV EMUL
INTRAVENOUS | Status: DC | PRN
  Administered 2021-11-27: 50 ug/kg/min via INTRAVENOUS

## 2021-11-27 MED ORDER — SODIUM CHLORIDE 0.9 % IV SOLN: INTRAVENOUS | Status: DC

## 2021-11-27 MED ORDER — PANTOPRAZOLE SODIUM 40 MG PO TBEC
40.0000 mg | DELAYED_RELEASE_TABLET | Freq: Two times a day (BID) | ORAL | Status: DC
  Administered 2021-11-27 – 2021-11-29 (×4): 40 mg via ORAL
  Filled 2021-11-27 (×4): qty 1

## 2021-11-27 MED ORDER — FLUCONAZOLE 100 MG PO TABS
100.0000 mg | ORAL_TABLET | Freq: Every day | ORAL | Status: DC
  Administered 2021-11-28 – 2021-11-29 (×2): 100 mg via ORAL
  Filled 2021-11-27 (×2): qty 1

## 2021-11-27 MED ORDER — ISOSORBIDE MONONITRATE ER 60 MG PO TB24
60.0000 mg | ORAL_TABLET | Freq: Every day | ORAL | Status: DC
  Administered 2021-11-27 – 2021-11-29 (×3): 60 mg via ORAL
  Filled 2021-11-27 (×3): qty 1

## 2021-11-27 MED ORDER — FLUCONAZOLE 100 MG PO TABS
200.0000 mg | ORAL_TABLET | Freq: Once | ORAL | Status: AC
  Administered 2021-11-27: 200 mg via ORAL
  Filled 2021-11-27: qty 2

## 2021-11-27 MED ORDER — PHENYLEPHRINE HCL-NACL 20-0.9 MG/250ML-% IV SOLN
INTRAVENOUS | Status: DC | PRN
  Administered 2021-11-27: 25 ug/min via INTRAVENOUS

## 2021-11-27 MED ORDER — LIDOCAINE 2% (20 MG/ML) 5 ML SYRINGE
INTRAMUSCULAR | Status: DC | PRN
  Administered 2021-11-27: 40 mg via INTRAVENOUS

## 2021-11-27 SURGICAL SUPPLY — 15 items

## 2021-11-27 NOTE — Progress Notes (Signed)
Pharmacy Consult Note  Kim [name] is a 76 y.o. female admitted on 11/18/2021 with rectal pain. Acute on chronic anemia with fecal impaction and rectal bleeding.    S/p upper endoscopy today 11/27/21. Pharmacy has been consulted for fluconazole dosing for esophageal candidiasis  per GI recommendation.  Dr. Lorenso Courier recommended fluconazole 200mg   po x1 dose, then 100 mg qd x 13 doses / dose reduced for patient's renal function.   Plan: Fluconazole 200 mg PO today x1 dose, then 100 mg PO daily x 13 days.    Height: 5' (152.4 cm) Weight: 56.8 kg (125 lb 3.5 oz) IBW/kg (Calculated) : 45.5  Temp (24hrs), Avg:97.4 F (36.3 C), Min:97.3 F (36.3 C), Max:97.5 F (36.4 C)  Recent Labs  Lab 11/22/21 0444 11/23/21 0511 11/24/21 0328 11/25/21 0241 11/26/21 0350 11/27/21 0518  WBC 6.3  --  10.4 9.4 7.6 7.3  CREATININE 3.16* 2.99* 2.83* 2.99* 2.81* 2.74*    Estimated Creatinine Clearance: 13.8 mL/min (A) (by C-G formula based on SCr of 2.74 mg/dL (H)).    Allergies  Allergen Reactions   Beef-Derived Products Other (See Comments)    Vegetarian  Does not eat due to religious reasons   Beta Adrenergic Blockers Other (See Comments)    Bradycardia   Bystolic [Nebivolol Hcl] Other (See Comments)    Bradycardia    Chicken Protein Other (See Comments)    Vegetarian Does not eat due to religious reasons   Fish-Derived Products Other (See Comments)    Vegetarian  Does not eat due to religious reasons   Neurontin [Gabapentin] Other (See Comments)    Unknown reaction    Norvasc [Amlodipine] Swelling   Pork-Derived Products Other (See Comments)    Vegetarian (pt accepts heparin products 07/31/21) Does not eat due to religious reasons   Trandate [Labetalol] Other (See Comments)    Bradycardia   Zestril [Lisinopril] Other (See Comments)    Cough     Thank you for allowing pharmacy to be a part of this patient's care.  Nicole Cella, RPh Clinical Pharmacist [phone] 11/27/2021 2:08  PM Please check AMION for all [place] phone numbers After 10:00 PM, call [place] [phone]

## 2021-11-27 NOTE — Progress Notes (Addendum)
[facility] NEPHROLOGY PROGRESS NOTE  Assessment/ Plan: Pt is a 76 y.o. yo female   with a PMH significant for SLE, rheumatoid arthritis, HDN, HLD, chronic diastolic CHF, left cerebral artery embolic CVA, who presented to [facility] ED on 11/18/21 complaining of constipation, rectal pain, N/V and not eating much, seen as a consultation for AKI.  Hospital course complicated by acute renal retention, stercoral proctitis and severe anemia requiring blood transfusion.  #Acute kidney injury, nonoliguric likely due to ischemic ATN/volume depletion with h/o N/V, reduced oral intake, acute urine retention from fecal impaction, UTI and severe anemia.  UA with some protein and UTI.  CT scan reports distended bladder but unremarkable kidneys.   Serologies including ANA, anti-dsDNA, C3, C4 negative.  ANCA and protein electrophoresis pending. Per patient's daughter-in-law, the urine is not measured however, she reports increased urination with Lasix yesterday.  She looks clinically stable and creatinine level stable.  I briefly discussed about kidney biopsy procedure, she is not interested for biopsy at this time. Continue to hold losartan Ceftriaxone for UTI Strict ins and out, daily lab and watch for renal recovery.  #Severe anemia/acute blood loss anemia/rectal bleeding: Received blood transfusion and hemoglobin has improved.  Going for endoscopy today.Flexible sigmoidoscopy on 5/14 with distal rectal stercoral ulcer.   Eliquis on hold and currently on PPI.  #Acute urinary retention likely due to severe constipation status post Foley catheter which was later removed.  Continue bladder scan.  #Metabolic acidosis: Increased sodium bicarbonate to 3 times daily.  Monitor CO2 level.  #Hypokalemia: Replete potassium chloride.  #Rectal impaction with stercoral proctitis/constipation: Status post flexible sigmoidoscopy.  Stool softener per primary team.  #Hypertension: Blood pressure elevated.  They are  concerned about ankle edema and amlodipine.  I will discontinue amlodipine and increase isosorbide to 60 mg.  She is also on hydralazine.  #Shortness of breath/wheezing: Chest x-ray with possible pulmonary edema or pneumonia.  Received Lasix yesterday with some improvement.  She is currently n.p.o. and in room air.  Hold off Lasix now and currently on antibiotics.  She has no peripheral edema.  Subjective: Seen and examined at bedside.  Reports increased urine output however not measured accurately.  She still has some cough and mild wheeze.  No shortness of breath and currently on room air.  Currently n.p.o. for GI procedure today.  Her daughter-in-law present at the bedside.   Objective Vital signs in last 24 hours: Vitals:   11/26/21 2108 11/27/21 0414 11/27/21 0500 11/27/21 0900  BP: (!) 161/93  (!) 167/90 (!) 179/102  Pulse: 65  72 83  Resp: 18  16 15   Temp:    (!) 97.5 F (36.4 C)  TempSrc:    Oral  SpO2: 94%  90% 95%  Weight:  56.8 kg    Height:       Weight change: -0.988 kg  Intake/Output Summary (Last 24 hours) at 11/27/2021 0950 Last data filed at 11/27/2021 0458 Gross per 24 hour  Intake 711.17 ml  Output 400 ml  Net 311.17 ml        Labs: Basic Metabolic Panel: Recent Labs  Lab 11/25/21 0241 11/26/21 0350 11/27/21 0518  NA 136 137 138  K 3.3* 3.4* 3.8  CL 110 110 108  CO2 17* 19* 20*  GLUCOSE 114* 103* 92  BUN 28* 30* 31*  CREATININE 2.99* 2.81* 2.74*  CALCIUM 7.9* 7.9* 8.6*  PHOS  --  3.5 3.4    Liver Function Tests: Recent Labs  Lab  11/26/21 0350 11/27/21 0518  ALBUMIN 2.2* 2.3*    No results for input(s): LIPASE, AMYLASE in the last 168 hours. No results for input(s): AMMONIA in the last 168 hours. CBC: Recent Labs  Lab 11/22/21 0444 11/22/21 0444 11/24/21 0328 11/25/21 0241 11/26/21 0350 11/26/21 0737 11/26/21 1927 11/27/21 0518  WBC 6.3  --  10.4 9.4 7.6  --   --  7.3  NEUTROABS  --    < > 9.0* 8.1* 6.3  --   --  5.8  HGB  8.9*  --  8.1* 7.2* 6.6* 7.4* 8.7* 8.6*  HCT 25.9*  --  24.2* 21.9* 20.0* 22.6* 26.6* 25.1*  MCV 88.1  --  90.0 91.6 91.3  --   --  88.4  PLT 235  --  240 214 206  --   --  235   < > = values in this interval not displayed.    Cardiac Enzymes: No results for input(s): CKTOTAL, CKMB, CKMBINDEX, TROPONINI in the last 168 hours. CBG: No results for input(s): GLUCAP in the last 168 hours.  Iron Studies: No results for input(s): IRON, TIBC, TRANSFERRIN, FERRITIN in the last 72 hours. Studies/Results: DG Chest Port 1 View  Result Date: 11/26/2021 CLINICAL DATA:  Dyspnea, chest pain EXAM: PORTABLE CHEST 1 VIEW COMPARISON:  07/30/2021 FINDINGS: Diffuse bilateral interstitial and alveolar airspace opacities. Trace bilateral pleural effusions. No pneumothorax. Stable cardiomegaly. No acute osseous abnormality. Osteoarthritis of bilateral glenohumeral joints. IMPRESSION: 1. Diffuse bilateral interstitial and alveolar airspace opacities. Differential considerations include pulmonary edema of versus multilobar pneumonia versus ARDS. Electronically Signed   By: Kathreen Devoid M.D.   On: 11/26/2021 11:16    Medications: Infusions:  cefTRIAXone (ROCEPHIN)  IV 1 g (11/26/21 1044)    Scheduled Medications:  sodium chloride   Intravenous Once   amLODipine  5 mg Oral Daily   carvedilol  3.125 mg Oral BID WC   Chlorhexidine Gluconate Cloth  6 each Topical Daily   docusate sodium  100 mg Oral BID   famotidine  20 mg Oral Daily   feeding supplement  237 mL Oral TID BM   hydrALAZINE  100 mg Oral Q8H   hydroxychloroquine  200 mg Oral Daily   isosorbide mononitrate  30 mg Oral Daily   magnesium oxide  400 mg Oral QPM   multivitamin with minerals  1 tablet Oral Daily   pantoprazole  40 mg Oral Daily   polyethylene glycol  17 g Oral TID   rOPINIRole  1 mg Oral QHS   rosuvastatin  40 mg Oral Daily   senna-docusate  2 tablet Oral QHS   sodium bicarbonate  650 mg Oral TID   sodium chloride flush  3 mL  Intravenous Q12H   Vitamin D (Ergocalciferol)  50,000 Units Oral Q Thu    have reviewed scheduled and prn medications.  Physical Exam: General: Able to lie flat, not in distress Heart:RRR, s1s2 nl Lungs: Basal rhonchi, no increased work of breathing Abdomen:soft, Non-tender, non-distended Extremities:No peripheral edema Neurology: Alert, awake  Julia Alkhatib Prasad [place] 11/27/2021,9:50 AM  LOS: 8 days

## 2021-11-27 NOTE — Anesthesia Preprocedure Evaluation (Addendum)
Anesthesia Evaluation  Patient identified by MRN, date of birth, ID band Patient awake    Reviewed: Allergy & Precautions, NPO status , Patient's Chart, lab work & pertinent test results  History of Anesthesia Complications Negative for: history of anesthetic complications  Airway Mallampati: III  TM Distance: >3 FB Neck ROM: Full    Dental  (+) Edentulous Lower, Edentulous Upper   Pulmonary neg pulmonary ROS,    Pulmonary exam normal        Cardiovascular hypertension, Pt. on medications + dysrhythmias Atrial Fibrillation  Rhythm:Irregular Rate:Normal   '23 TEE - EF 60 to 65%. Mild mitral valve regurgitation. Aortic valve regurgitation is mild. There is borderline dilatation of the ascending aorta, measuring 39 mm. Agitated saline contrast bubble study was positive with shunting observed within 3-6 cardiac cycles suggestive of interatrial shunt. There is a small patent foramen ovale with bidirectional shunting across atrial septum.     Neuro/Psych PSYCHIATRIC DISORDERS Depression TIA Neuromuscular disease CVA, Residual Symptoms    GI/Hepatic Neg liver ROS, PUD,   Endo/Other  negative endocrine ROS  Renal/GU Renal InsufficiencyRenal disease     Musculoskeletal  (+) Arthritis ,   Abdominal   Peds  Hematology  (+) Blood dyscrasia, anemia ,  On eliquis    Anesthesia Other Findings   Reproductive/Obstetrics                            Anesthesia Physical Anesthesia Plan  ASA: 3  Anesthesia Plan: MAC   Post-op Pain Management: Minimal or no pain anticipated   Induction:   PONV Risk Score and Plan: 2 and Propofol infusion and Treatment may vary due to age or medical condition  Airway Management Planned: Nasal Cannula and Natural Airway  Additional Equipment: None  Intra-op Plan:   Post-operative Plan:   Informed Consent: I have reviewed the patients History and Physical, chart,  labs and discussed the procedure including the risks, benefits and alternatives for the proposed anesthesia with the patient or authorized representative who has indicated his/her understanding and acceptance.     Consent reviewed with POA and Interpreter used for interveiw  Plan Discussed with: CRNA and Anesthesiologist  Anesthesia Plan Comments:        Anesthesia Quick Evaluation

## 2021-11-27 NOTE — Progress Notes (Signed)
PROGRESS NOTE  Kim [name]  DOB: 1945/07/28  PCP: Curt Jews, [place] [id number]  DOA: 11/18/2021  LOS: 8 days  Hospital Day: 10  Brief narrative: Kim [name] is a 76 y.o. female with PMH significant for SLE with rheumatoid arthritis, HTN, HLD, diastolic CHF, A-fib, recent left cerebral artery embolic CVA hospitalized both in December 2022 and once again in January 2023. Patient presented to the ED on 5/11 with complaint of constipation and rectal pain ongoing for several days not responding to Dulcolax suppositories. She had implantable cardiac monitor in place after recent stroke and was recently diagnosed with atrial fibrillation and started on Eliquis on 5/2.   Labs in the ED showed an elevated BUN/creatinine of 55/4.33 (baseline normal) and a low sodium of 132 and low serum bicarb level of 18. CT abdomen pelvis showed large volume of stool within the rectal vault with superimposed inflammatory changes suggesting fecal impaction with associated stercoral proctitis and a moderate rectal prolapse.  She was also noted with bladder distention with a volume of 786 cc so a Foley catheter was placed.   Admitted to hospitalist service  Subjective: Patient was seen and examined this morning.  Sitting up in chair.  Not in distress Breathing better. Hemoglobin and creatinine both improving She is waiting for EGD this morning..  Principal Problem:   AKI (acute kidney injury) ([place]) Active Problems:   HTN (hypertension)   Mixed hyperlipidemia   Restless leg syndrome   Systemic lupus erythematosus (HCC)   (HFpEF) heart failure with preserved ejection fraction (HCC)   Anemia   Constipation   Stercoral ulcer of anus   Dehydration   Atrial fibrillation, chronic (HCC)   History of cardioembolic cerebrovascular accident (CVA)   Acute kidney injury ([place])   Fecal impaction (HCC)   Gastrointestinal hemorrhage    Assessment and Plan: Acute kidney injury Metabolic  acidosis -Presented with creatinine elevated to 4.3.,  Baseline creatinine less than 1.  Creatinine level gradually improving, 2.74 today.  Nephrology consult appreciated but in a fluctuating course.    -Nephrology following.   -Serum bicarb level improving.  Sodium bicarbonate tablet to continue. Recent Labs    11/18/21 2026 11/19/21 0926 11/20/21 0930 11/21/21 0706 11/22/21 0444 11/23/21 0511 11/24/21 0328 11/25/21 0241 11/26/21 0350 11/27/21 0518  BUN 55* 52* 48* 43* 37* 29* 27* 28* 30* 31*  CREATININE 4.33* 4.27* 3.81* 3.48* 3.16* 2.99* 2.83* 2.99* 2.81* 2.74*  CO2 18* 15* 15* 17* 19* 19* 15* 17* 19* 20*     Acute blood loss anemia -Initially patient was suspected to have bleeding from stercoral ulcer but no bleeding was identified on sigmoidoscopy.   -5/20, patient underwent EGD and was found to have a large hiatal hernia, gastritis, as well as new liver lesion and esophagus mucosa suspicious of fungal infection.  GI started the patient on PPI twice daily for 8 weeks, fluconazole 100 mg daily for 2 weeks. -Eliquis on hold.  -Monitor hemoglobin Recent Labs    11/19/21 0926 11/20/21 0930 11/25/21 0241 11/26/21 0350 11/26/21 0737 11/26/21 1927 11/27/21 0518  HGB 6.3*   < > 7.2* 6.6* 7.4* 8.7* 8.6*  MCV 92.9   < > 91.6 91.3  --   --  88.4  VITAMINB12 766  --   --   --   --   --   --   FOLATE 8.8  --   --   --   --   --   --   FERRITIN 189  --   --   --   --   --   --  TIBC 203*  --   --   --   --   --   --   IRON 31  --   --   --   --   --   --   RETICCTPCT 2.0  --   --   --   --   --   --    < > = values in this interval not displayed.    Acute urinary retention Gross hematuria -On admission, she had a urine retention of more than 700 mL.  Likely due to severe constipation.  Urinalysis also showed hematuria.  She had Foley catheter in place which were successfully removed on 5/15.  Voiding trial successful.   -5/17, urinalysis was repeated which showed yellow urine  but large amount of hemoglobin and RBC.  5/19, discussed with urologist Dr. Gloriann Loan.  Without evidence of stone or mass in the CT abdomen and pelvis, he doubts that patient needs any urological intervention at this time.  While her bladder was distended, she might have tore some capillaries leading to hematuria.  Continue to monitor.    Rectal impaction with stercoral proctitis/focal constipation -Primary complaint of constipation.  Partially disimpacted by EDP with some blood noted during disimpaction.  Seen by GI.  S/p flexible sigmoidoscopy on 11/21/2021 which showed stercoral distal rectal ulcer without visible vessel or current bleeding.  Otherwise normal.   -GI recommended avoiding constipation with daily Senokot, MiraLAX. -5/17, abdominal x-ray with no more retention stool.  Acute respiratory failure with hypoxia -Requiring 2 to 3 L oxygen at baseline.  Dyspnea worsened by anemia.  Continue to wean down as tolerated.  Obtain chest x-ray today.   Paroxysmal atrial fibrillation -Currently rate controlled on low-dose Coreg. -Eliquis was initially held.  Resumed on 5/15.  With worsening anemia, I stopped Eliquis again on 5/18.   Recent left cerebellar artery embolic CVA December [id number]:  -Previously in aspirin and Plavix.  After the cardiac monitor showed A-fib, she was switched to Eliquis on 5/2 by her outpatient cardiologist  Chronic diastolic CHF Essential hypertension -Currently on Coreg, amlodipine, Imdur, hydralazine.  Losartan on hold. -Continue to monitor blood pressure.  Failure to thrive in adult -Previously had been on Megace without any response to this medication so it was discontinued according to daughter -Patient encouraged to drink more fluids as opposed to eating significant volumes of food.     SLE/RA Continue Plaquenil  Goals of [name]   Code Status: Full Code    Mobility: Encourage ambulation  Skin assessment:     Nutritional status:  Body mass index is 24.46  kg/m.  Nutrition Problem: Inadequate oral intake Etiology: poor appetite Signs/Symptoms: per patient/family report     Diet:  Diet Order             Diet NPO time specified  Diet effective now                   DVT prophylaxis:  SCDs Start: 11/19/21 0743   Antimicrobials: None Fluid: Not on IV fluid Consultants: GI, nephrology Family Communication: daughter-in-law at bedside  Status is: Inpatient  Continue in-hospital [name] because: Gradually improving renal function Level of [name]: Med-Surg   Dispo: The patient is from: Home              Anticipated d/c is to: Home in 2 to 3 days              Patient currently is not medically stable to  d/c.   Difficult to place patient No     Infusions:   sodium chloride 20 mL/hr at 11/27/21 1148   [MAR Hold] cefTRIAXone (ROCEPHIN)  IV 1 g (11/26/21 1044)    Scheduled Meds:  [id number] Hold] sodium chloride   Intravenous Once   [MAR Hold] carvedilol  3.125 mg Oral BID WC   [MAR Hold] Chlorhexidine Gluconate Cloth  6 each Topical Daily   [MAR Hold] docusate sodium  100 mg Oral BID   [MAR Hold] famotidine  20 mg Oral Daily   [MAR Hold] feeding supplement  237 mL Oral TID BM   [MAR Hold] hydrALAZINE  100 mg Oral Q8H   [MAR Hold] hydroxychloroquine  200 mg Oral Daily   [MAR Hold] isosorbide mononitrate  60 mg Oral Daily   [MAR Hold] magnesium oxide  400 mg Oral QPM   [MAR Hold] multivitamin with minerals  1 tablet Oral Daily   [MAR Hold] pantoprazole  40 mg Oral Daily   [MAR Hold] polyethylene glycol  17 g Oral TID   [MAR Hold] rOPINIRole  1 mg Oral QHS   [MAR Hold] rosuvastatin  40 mg Oral Daily   [MAR Hold] senna-docusate  2 tablet Oral QHS   [MAR Hold] sodium bicarbonate  650 mg Oral TID   [MAR Hold] sodium chloride flush  3 mL Intravenous Q12H   [MAR Hold] Vitamin D (Ergocalciferol)  50,000 Units Oral Q Thu    PRN meds: [MAR Hold] acetaminophen **OR** [MAR Hold] acetaminophen, [MAR Hold] lip balm, [MAR Hold]  ondansetron **OR** [MAR Hold] ondansetron (ZOFRAN) IV, [MAR Hold] traMADol   Antimicrobials: Anti-infectives (From admission, onward)    Start     Dose/Rate Route Frequency Ordered Stop   11/25/21 1030  [MAR Hold]  cefTRIAXone (ROCEPHIN) 1 g in sodium chloride 0.9 % 100 mL IVPB        (MAR Hold since Sat 11/27/2021 at 1135.Hold Reason: Transfer to a Procedural area)   1 g 200 mL/hr over 30 Minutes Intravenous Every 24 hours 11/25/21 0933     11/19/21 1000  [MAR Hold]  hydroxychloroquine (PLAQUENIL) tablet 200 mg        (MAR Hold since Sat 11/27/2021 at 1135.Hold Reason: Transfer to a Procedural area)   200 mg Oral Daily 11/19/21 0750         Objective: Vitals:   11/27/21 0900 11/27/21 1145  BP: (!) 179/102 (!) 174/105  Pulse: 83 68  Resp: 15 20  Temp: (!) 97.5 F (36.4 C) (!) 97.4 F (36.3 C)  SpO2: 95% 93%    Intake/Output Summary (Last 24 hours) at 11/27/2021 1159 Last data filed at 11/27/2021 0458 Gross per 24 hour  Intake 711.17 ml  Output 400 ml  Net 311.17 ml    Filed Weights   11/25/21 2125 11/27/21 0414 11/27/21 1145  Weight: 57.8 kg 56.8 kg 56.8 kg   Weight change: -0.988 kg Body mass index is 24.46 kg/m.   Physical Exam: General exam: Pleasant, elderly Caucasian female.  Not in physical distress Skin: No rashes, lesions or ulcers. HEENT: Atraumatic, normocephalic, no obvious bleeding Lungs: Dyspneic this morning but clear to auscultation bilaterally. CVS: Regular rate and rhythm, no murmur GI/Abd soft, nontender, nondistended, bowel sound present CNS: Alert, awake, oriented x3 Psychiatry: Mood appropriate Extremities: Trace pedal edema on the right, no calf tenderness  Data Review: I have personally reviewed the laboratory data and studies available.  F/u labs ordered [employer] (From admission, onward)     Start  Ordered   11/28/21 [id number]  Basic metabolic panel  Daily,   R      11/27/21 0803   11/28/21 0500  CBC with Differential/Platelet   Daily,   R      11/27/21 0803   11/26/21 0500  Renal function panel  Daily,   R      11/25/21 1519   11/25/21 1519  Protein electrophoresis, serum  Once,   R        11/25/21 1518   11/25/21 1518  ANCA Titers  (Anti-Neutrophilic Cystoplasmic Antibody Panel (PNL))  Once,   R        11/25/21 1518            Signed, Terrilee Croak, MD [facility] 11/27/2021

## 2021-11-27 NOTE — Interval H&P Note (Signed)
History and Physical Interval Note:  11/27/2021 10:07 AM  Kim [name]  has presented today for surgery, with the diagnosis of anemia.  The various methods of treatment have been discussed with the patient and family. After consideration of risks, benefits and other options for treatment, the patient has consented to  Procedure(s): ESOPHAGOGASTRODUODENOSCOPY (EGD) WITH PROPOFOL (N/A) as a surgical intervention.  The patient's history has been reviewed, patient examined, patient has had improvement in her respiratory status, stable for surgery.  I have reviewed the patient's chart and labs.  Questions were answered to the patient's satisfaction.     [name]

## 2021-11-27 NOTE — Transfer of Care (Signed)
Immediate Anesthesia Transfer of Care Note  Patient: Kim [name] Care  Procedure(s) Performed: ESOPHAGOGASTRODUODENOSCOPY (EGD) WITH PROPOFOL BIOPSY  Patient Location: PACU  Anesthesia Type:MAC  Level of Consciousness: drowsy  Airway & Oxygen Therapy: Patient Spontanous Breathing and Patient connected to nasal cannula oxygen  Post-op Assessment: Report given to RN, Post -op Vital signs reviewed and stable and Patient moving all extremities  Post vital signs: Reviewed and stable  Last Vitals:  Vitals Value Taken Time  BP 118/80 11/27/21 1222  Temp 36.3 C 11/27/21 1222  Pulse 62 11/27/21 1222  Resp 21 11/27/21 1222  SpO2 94 % 11/27/21 1222    Last Pain:  Vitals:   11/27/21 1222  TempSrc: Temporal  PainSc:       Patients Stated Pain Goal: 0 (02/72/53 6644)  Complications: No notable events documented.

## 2021-11-27 NOTE — Progress Notes (Signed)
Brief Update Note: Saw patient today and she is now off of all oxygen after getting diuresed with Lasix. Was able to ambulate in the hallway. Last dose of apixaban was 2 days ago. Hb responded well to pRBCs. Will plan for EGD today.

## 2021-11-27 NOTE — Anesthesia Postprocedure Evaluation (Signed)
Anesthesia Post Note  Patient: Kim [name]  Procedure(s) Performed: ESOPHAGOGASTRODUODENOSCOPY (EGD) WITH PROPOFOL BIOPSY     Patient location during evaluation: PACU Anesthesia Type: MAC Level of consciousness: awake and alert Pain management: pain level controlled Vital Signs Assessment: post-procedure vital signs reviewed and stable Respiratory status: spontaneous breathing, nonlabored ventilation and respiratory function stable Cardiovascular status: stable and blood pressure returned to baseline Anesthetic complications: no   No notable events documented.  Last Vitals:  Vitals:   11/27/21 1227 11/27/21 1230  BP:  128/79  Pulse:  63  Resp:  (!) 25  Temp:    SpO2: 96% 94%    Last Pain:  Vitals:   11/27/21 1222  TempSrc: Temporal  PainSc:                  [name]

## 2021-11-27 NOTE — Anesthesia Procedure Notes (Signed)
Procedure Name: MAC Date/Time: 11/27/2021 11:57 AM Performed by: Leonor Liv, CRNA Pre-anesthesia Checklist: Patient identified, Emergency Drugs available, Suction available, Patient being monitored and Timeout performed Patient Re-evaluated:Patient Re-evaluated prior to induction Oxygen Delivery Method: Nasal cannula Airway Equipment and Method: Bite block Placement Confirmation: positive ETCO2 Dental Injury: Teeth and Oropharynx as per pre-operative assessment

## 2021-11-27 NOTE — Op Note (Addendum)
[facility] Patient Name: Kim [name] Procedure Date : 11/27/2021 MRN: [id number] Attending MD: Georgian Co ,  Date of Birth: [birth year] CSN: [id number] Age: 76 Admit Type: Inpatient Procedure:                Upper GI endoscopy Indications:              Anemia, Early satiety Providers:                Georgian Co, MD Referring MD:             Hospitalist team Medicines:                Monitored Anesthesia Care Complications:            No immediate complications. Estimated Blood Loss:     Estimated blood loss was minimal. Procedure:                Pre-Anesthesia Assessment:                           - Prior to the procedure, a History and Physical                            was performed, and patient medications and                            allergies were reviewed. The patient's tolerance of                            previous anesthesia was also reviewed. The risks                            and benefits of the procedure and the sedation                            options and risks were discussed with the patient.                            All questions were answered, and informed consent                            was obtained. Prior Anticoagulants: The patient has                            taken no previous anticoagulant or antiplatelet                            agents. ASA Grade Assessment: III - A patient with                            severe systemic disease. After reviewing the risks                            and benefits, the patient was deemed in  satisfactory condition to undergo the procedure.                           After obtaining informed consent, the endoscope was                            passed under direct vision. Throughout the                            procedure, the patient's blood pressure, pulse, and                            oxygen saturations were monitored continuously. The                             [id number] Olympus endoscope was introduced                            through the mouth, and advanced to the second part                            of duodenum. The upper GI endoscopy was                            accomplished without difficulty. The patient                            tolerated the procedure well. Scope In: Scope Out: Findings:      White nummular lesions were noted in the entire esophagus. Biopsies were       taken with a cold forceps for histology.      A large hiatal hernia was present.      Localized inflammation characterized by congestion (edema) and erythema       was found in the gastric antrum. Biopsies were taken with a cold forceps       for histology.      The examined duodenum was normal. Biopsies were taken with a cold       forceps for histology. Impression:               - White nummular lesions in esophageal mucosa.                            Biopsied.                           - Large hiatal hernia.                           - Gastritis. Biopsied.                           - Normal examined duodenum. Biopsied. Recommendation:           - Return patient to hospital ward for ongoing care.                           -  I suspect at this time that the patient's anemia                            is multifactorial. She does not have clear iron                            deficiency based upon her labs.                           - Use a proton pump inhibitor PO BID for 8 weeks,                            then decrease to QD.                           - Start fluconazole 200 mg QD x 1 dose, followed by                            100 mg QD x 13 doses (reduced dosing due the                            patient's kidney function).                           - Await pathology results.                           - The findings and recommendations were discussed                            with the patient. Procedure Code(s):        --- Professional  ---                           [phone], Esophagogastroduodenoscopy, flexible,                            transoral; with biopsy, single or multiple Diagnosis Code(s):        --- Professional ---                           K22.8, Other specified diseases of esophagus                           K44.9, Diaphragmatic hernia without obstruction or                            gangrene                           K29.70, Gastritis, unspecified, without bleeding                           D64.9, Anemia, unspecified  R68.81, Early satiety CPT copyright 2019 [employer]. All rights reserved. The codes documented in this report are preliminary and upon coder review may  be revised to meet current compliance requirements. Kim [name] "Kim [name],  11/27/2021 12:25:30 PM Number of Addenda: 0

## 2021-11-28 DIAGNOSIS — N179 Acute kidney failure, unspecified: Secondary | ICD-10-CM | POA: Diagnosis not present

## 2021-11-28 LAB — RENAL FUNCTION PANEL
Albumin: 2.1 g/dL — ABNORMAL LOW (ref 3.5–5.0)
Anion gap: 10 (ref 5–15)
BUN: 35 mg/dL — ABNORMAL HIGH (ref 8–23)
CO2: 21 mmol/L — ABNORMAL LOW (ref 22–32)
Calcium: 8.4 mg/dL — ABNORMAL LOW (ref 8.9–10.3)
Chloride: 106 mmol/L (ref 98–111)
Creatinine, Ser: 2.8 mg/dL — ABNORMAL HIGH (ref 0.44–1.00)
GFR, Estimated: 17 mL/min — ABNORMAL LOW (ref >60)
Glucose, Bld: 100 mg/dL — ABNORMAL HIGH (ref 70–99)
Phosphorus: 3.5 mg/dL (ref 2.5–4.6)
Potassium: 3.2 mmol/L — ABNORMAL LOW (ref 3.5–5.1)
Sodium: 137 mmol/L (ref 135–145)

## 2021-11-28 LAB — CBC WITH DIFFERENTIAL/PLATELET
Abs Immature Granulocytes: 0.04 10*3/uL (ref 0.00–0.07)
Basophils Absolute: 0 10*3/uL (ref 0.0–0.1)
Basophils Relative: 0 %
Eosinophils Absolute: 0.2 10*3/uL (ref 0.0–0.5)
Eosinophils Relative: 3 %
HCT: 24 % — ABNORMAL LOW (ref 36.0–46.0)
Hemoglobin: 8.2 g/dL — ABNORMAL LOW (ref 12.0–15.0)
Immature Granulocytes: 1 %
Lymphocytes Relative: 8 %
Lymphs Abs: 0.6 10*3/uL — ABNORMAL LOW (ref 0.7–4.0)
MCH: 30.3 pg (ref 26.0–34.0)
MCHC: 34.2 g/dL (ref 30.0–36.0)
MCV: 88.6 fL (ref 80.0–100.0)
Monocytes Absolute: 0.6 10*3/uL (ref 0.1–1.0)
Monocytes Relative: 8 %
Neutro Abs: 5.7 10*3/uL (ref 1.7–7.7)
Neutrophils Relative %: 80 %
Platelets: 232 10*3/uL (ref 150–400)
RBC: 2.71 MIL/uL — ABNORMAL LOW (ref 3.87–5.11)
RDW: 16.7 % — ABNORMAL HIGH (ref 11.5–15.5)
WBC: 7.1 10*3/uL (ref 4.0–10.5)
nRBC: 0 % (ref 0.0–0.2)

## 2021-11-28 MED ORDER — POTASSIUM CHLORIDE CRYS ER 20 MEQ PO TBCR
40.0000 meq | EXTENDED_RELEASE_TABLET | Freq: Once | ORAL | Status: AC
  Administered 2021-11-28: 40 meq via ORAL
  Filled 2021-11-28: qty 2

## 2021-11-28 MED ORDER — ALBUMIN HUMAN 25 % IV SOLN
25.0000 g | Freq: Four times a day (QID) | INTRAVENOUS | Status: AC
  Administered 2021-11-28 (×2): 25 g via INTRAVENOUS
  Filled 2021-11-28 (×2): qty 100

## 2021-11-28 NOTE — Progress Notes (Signed)
PROGRESS NOTE  Kim [name]  DOB: Mar 25, 1946  PCP: Kim [name], [place] [id number]  DOA: 11/18/2021  LOS: 9 days  Hospital Day: 11  Brief narrative: Kim [name] is a 76 y.o. female with PMH significant for SLE with rheumatoid arthritis, HTN, HLD, diastolic CHF, A-fib, recent left cerebral artery embolic CVA hospitalized both in December 2022 and once again in January 2023. Patient presented to the ED on 5/11 with complaint of constipation and rectal pain ongoing for several days not responding to Dulcolax suppositories. She had implantable cardiac monitor in place after recent stroke and was recently diagnosed with atrial fibrillation and started on Eliquis on 5/2.   Labs in the ED showed an elevated BUN/creatinine of 55/4.33 (baseline normal) and a low sodium of 132 and low serum bicarb level of 18. CT abdomen pelvis showed large volume of stool within the rectal vault with superimposed inflammatory changes suggesting fecal impaction with associated stercoral proctitis and a moderate rectal prolapse.  She was also noted with bladder distention with a volume of 786 cc so a Foley catheter was placed.   Admitted to hospitalist service  Subjective: Patient was seen and examined this morning.  Elderly female.  Lying down in bed.  Abdominal pain continues but better than yesterday.  Making urine.  Not measured.   Labs this morning with creatinine elevated but stable. Daughter-in-law at bedside  Principal Problem:   AKI (acute kidney injury) ([place]) Active Problems:   HTN (hypertension)   Mixed hyperlipidemia   Restless leg syndrome   Systemic lupus erythematosus (HCC)   (HFpEF) heart failure with preserved ejection fraction (HCC)   Anemia   Constipation   Stercoral ulcer of anus   Dehydration   Atrial fibrillation, chronic (HCC)   History of cardioembolic cerebrovascular accident (CVA)   Acute kidney injury ([place])   Fecal impaction (HCC)   Gastrointestinal hemorrhage     Assessment and Plan: Acute kidney injury Metabolic acidosis -Presented with creatinine elevated to 4.3. Baseline creatinine less than 1. Creatinine level seems to have stabilized in the ballpark of 2.8 for last 3 days.  Nephrology consult appreciated.  Noted a plan of IV albumin 3 to improve intravascular volume. -Serum bicarb level improving.  Sodium bicarbonate tablet to continue. Recent Labs    11/19/21 0926 11/20/21 0930 11/21/21 0706 11/22/21 0444 11/23/21 0511 11/24/21 0328 11/25/21 0241 11/26/21 0350 11/27/21 0518 11/28/21 0111  BUN 52* 48* 43* 37* 29* 27* 28* 30* 31* 35*  CREATININE 4.27* 3.81* 3.48* 3.16* 2.99* 2.83* 2.99* 2.81* 2.74* 2.80*  CO2 15* 15* 17* 19* 19* 15* 17* 19* 20* 21*    Acute blood loss anemia -Initially patient was suspected to have bleeding from stercoral ulcer but no bleeding was identified on sigmoidoscopy.   -5/20, patient underwent EGD and was found to have a large hiatal hernia, gastritis, as well as new liver lesion and esophagus mucosa suspicious of fungal infection.  GI started the patient on PPI twice daily for 8 weeks, fluconazole 100 mg daily for 2 weeks. -Eliquis on hold.  -Monitor hemoglobin.  Stable over 8. Recent Labs    11/19/21 0926 11/20/21 0930 11/26/21 0350 11/26/21 0737 11/26/21 1927 11/27/21 0518 11/28/21 0111  HGB 6.3*   < > 6.6* 7.4* 8.7* 8.6* 8.2*  MCV 92.9   < > 91.3  --   --  88.4 88.6  VITAMINB12 766  --   --   --   --   --   --   FOLATE 8.8  --   --   --   --   --   --  FERRITIN 189  --   --   --   --   --   --   TIBC 203*  --   --   --   --   --   --   IRON 31  --   --   --   --   --   --   RETICCTPCT 2.0  --   --   --   --   --   --    < > = values in this interval not displayed.   Acute urinary retention Gross hematuria -On admission, she had a urine retention of more than 700 mL.  Likely due to severe constipation.  Urinalysis also showed hematuria.  She had Foley catheter in place which were  successfully removed on 5/15.  Voiding trial successful.   -5/17, urinalysis was repeated which showed yellow urine but large amount of hemoglobin and RBC.  5/19, discussed with urologist Dr. Gloriann [name].  Without evidence of stone or mass in the CT abdomen and pelvis, he doubts that patient needs any urological intervention at this time.  While her bladder was distended, she might have tore some capillaries leading to hematuria.  Hematuria improving.  Continue to monitor.    Rectal impaction with stercoral proctitis/focal constipation -Primary complaint of constipation.  Partially disimpacted by EDP with some blood noted during disimpaction.  Seen by GI.  S/p flexible sigmoidoscopy on 11/21/2021 which showed stercoral distal rectal ulcer without visible vessel or current bleeding.  Otherwise normal.   -GI recommended avoiding constipation with daily Senokot, MiraLAX. -5/17, abdominal x-ray with no more retention stool.  Volume overload Acute exacerbation of chronic diastolic CHF Essential hypertension Acute respiratory failure with hypoxia -Patient required significant amount of IV fluid because of AKI.  Chest x-ray obtained on 5/19 showed significant amount of volume overload.  Lasix 1 dose was given by nephrology.  Respiratory status improving.  Not on supplemental oxygen at this time.   -Continue Coreg, amlodipine, Imdur, hydralazine.  Continue to monitor blood pressure.  Paroxysmal atrial fibrillation -Currently rate controlled on low-dose Coreg. -Eliquis was initially held.  Resumed on 5/15.  With worsening anemia, I stopped Eliquis again on 5/18.   Recent left cerebellar artery embolic CVA December [id number]:  -Previously in aspirin and Plavix.  After the cardiac monitor showed A-fib, she was switched to Eliquis on 5/2 by her outpatient cardiologist  Failure to thrive in adult -Previously had been on Megace without any response to this medication so it was discontinued according to  daughter -Patient encouraged to drink more fluids as opposed to eating significant volumes of food.     SLE/RA Continue Plaquenil  Goals of [name]   Code Status: Full Code    Mobility: Encourage ambulation  Skin assessment:     Nutritional status:  Body mass index is 24.46 kg/m.  Nutrition Problem: Inadequate oral intake Etiology: poor appetite Signs/Symptoms: per patient/family report     Diet:  Diet Order             Diet vegetarian Room service appropriate? Yes; Fluid consistency: Thin  Diet effective now                   DVT prophylaxis:  SCDs Start: 11/19/21 0743   Antimicrobials: None Fluid: Not on IV fluid Consultants: GI, nephrology Family Communication: daughter-in-law at bedside  Status is: Inpatient  Continue in-hospital [name] because: Gradually improving renal function Level of [name]: Med-Surg   Dispo: The patient  is from: Home              Anticipated d/c is to: Home in 2 to 3 days              Patient currently is not medically stable to d/c.   Difficult to place patient No     Infusions:   albumin human 25 g (11/28/21 1335)   cefTRIAXone (ROCEPHIN)  IV 1 g (11/28/21 1041)    Scheduled Meds:  sodium chloride   Intravenous Once   carvedilol  3.125 mg Oral BID WC   Chlorhexidine Gluconate Cloth  6 each Topical Daily   docusate sodium  100 mg Oral BID   famotidine  20 mg Oral Daily   feeding supplement  237 mL Oral TID BM   fluconazole  100 mg Oral Daily   hydrALAZINE  100 mg Oral Q8H   hydroxychloroquine  200 mg Oral Daily   isosorbide mononitrate  60 mg Oral Daily   magnesium oxide  400 mg Oral QPM   multivitamin with minerals  1 tablet Oral Daily   pantoprazole  40 mg Oral BID   polyethylene glycol  17 g Oral TID   rOPINIRole  1 mg Oral QHS   rosuvastatin  40 mg Oral Daily   senna-docusate  2 tablet Oral QHS   sodium bicarbonate  650 mg Oral TID   sodium chloride flush  3 mL Intravenous Q12H   Vitamin D (Ergocalciferol)   50,000 Units Oral Q Thu    PRN meds: acetaminophen **OR** acetaminophen, lip balm, ondansetron **OR** ondansetron (ZOFRAN) IV, traMADol   Antimicrobials: Anti-infectives (From admission, onward)    Start     Dose/Rate Route Frequency Ordered Stop   11/28/21 1000  fluconazole (DIFLUCAN) tablet 100 mg       See Hyperspace for full Linked Orders Report.   100 mg Oral Daily 11/27/21 1407 12/11/21 0959   11/27/21 1500  fluconazole (DIFLUCAN) tablet 200 mg       See Hyperspace for full Linked Orders Report.   200 mg Oral  Once 11/27/21 1407 11/27/21 1736   11/25/21 1030  cefTRIAXone (ROCEPHIN) 1 g in sodium chloride 0.9 % 100 mL IVPB        1 g 200 mL/hr over 30 Minutes Intravenous Every 24 hours 11/25/21 0933     11/19/21 1000  hydroxychloroquine (PLAQUENIL) tablet 200 mg        200 mg Oral Daily 11/19/21 0750         Objective: Vitals:   11/28/21 0548 11/28/21 0912  BP: (!) 160/90 (!) 196/94  Pulse: 69 85  Resp: 16 16  Temp: 97.6 F (36.4 C) 97.8 F (36.6 C)  SpO2: 93% 95%    Intake/Output Summary (Last 24 hours) at 11/28/2021 1341 Last data filed at 11/28/2021 0900 Gross per 24 hour  Intake 420 ml  Output 400 ml  Net 20 ml   Filed Weights   11/25/21 2125 11/27/21 0414 11/27/21 1145  Weight: 57.8 kg 56.8 kg 56.8 kg   Weight change: 0 kg Body mass index is 24.46 kg/m.   Physical Exam: General exam: Pleasant, elderly Caucasian female.  Not in physical distress. Skin: No rashes, lesions or ulcers. HEENT: Atraumatic, normocephalic, no obvious bleeding Lungs: Clear to auscultation bilaterally. CVS: Regular rate and rhythm, no murmur GI/Abd soft, nontender, nondistended, bowel sound present CNS: Alert, awake, oriented x3 Psychiatry: Mood appropriate Extremities: Trace pedal edema on the right, no calf tenderness  Data Review:  I have personally reviewed the laboratory data and studies available.  F/u labs ordered Unresulted Labs (From admission, onward)      Start     Ordered   11/28/21 [id number]  Basic metabolic panel  Daily,   R      11/27/21 0803   11/28/21 0500  CBC with Differential/Platelet  Daily,   R      11/27/21 0803   11/26/21 0500  Renal function panel  Daily,   R      11/25/21 1519   11/25/21 1519  Protein electrophoresis, serum  Once,   R        11/25/21 1518   11/25/21 1518  ANCA Titers  (Anti-Neutrophilic Cystoplasmic Antibody Panel (PNL))  Once,   R        11/25/21 1518            Signed, Terrilee Croak, MD [facility] 11/28/2021

## 2021-11-28 NOTE — Progress Notes (Signed)
Kim [name] PROGRESS NOTE  Assessment/ Plan: Pt is a 76 y.o. yo female   with a PMH significant for SLE, rheumatoid arthritis, HDN, HLD, chronic diastolic CHF, left cerebral artery embolic CVA, who presented to [facility] ED on 11/18/21 complaining of constipation, rectal pain, N/V and not eating much, seen as a consultation for AKI.  Hospital course complicated by acute renal retention, stercoral proctitis and severe anemia requiring blood transfusion.  #Acute kidney injury, nonoliguric likely due to ischemic ATN/volume depletion with h/o N/V, reduced oral intake, acute urine retention from fecal impaction, UTI and severe anemia.  UA with some protein and UTI.  CT scan reports distended bladder but unremarkable kidneys.   Serologies including ANA, anti-dsDNA, C3, C4 negative.  ANCA and protein electrophoresis pending. Per patient's daughter-in-law, the patient is having increased urination but it is not accurately measured.  The creatinine level is stable without much improvement.  I will order IV albumin to increase intravascular volume.  She continues to have GI issue with nausea and not able to take orally well.   I briefly discussed about kidney biopsy procedure, she is not interested for biopsy at this time. Continue to hold losartan Ceftriaxone for UTI Strict ins and out, daily lab and watch for renal recovery.  #Severe anemia/acute blood loss anemia/rectal bleeding: Received blood transfusion and hemoglobin has improved.  Flexible sigmoidoscopy on 5/14 with distal rectal stercoral ulcer.  Eliquis on hold and currently on PPI.  #Acute urinary retention likely due to severe constipation status post Foley catheter which was later removed.  Continue bladder scan.  #Metabolic acidosis: Increased sodium bicarbonate to 3 times daily.  Monitor CO2 level.  #Hypokalemia: Replete potassium chloride.  #Rectal impaction with stercoral proctitis/constipation: Status post flexible  sigmoidoscopy.  Stool softener per primary team.  #Hypertension: Blood pressure elevated.  They are concerned about ankle edema and amlodipine.  I will discontinue amlodipine and increase isosorbide to 60 mg.  She is also on hydralazine.  #Shortness of breath/wheezing: Chest x-ray with possible pulmonary edema or pneumonia.  Received Lasix on 5/19 with some improvement.  Holding diuretics as he is not eating much and looks intravascularly volume depleted.  #Gastritis, large hiatal hernia, esophageal lesion seen on endoscopy.  Per GI plan to treat with PPI and fluconazole.  Subjective: Seen and examined at bedside.  Able to walk without shortness of breath or chest pain.  Still having some nausea and decreased oral intake.  Urine output is not measured accurately.  Her daughter-in-law was present at the bedside.  Objective Vital signs in last 24 hours: Vitals:   11/27/21 1726 11/27/21 2115 11/28/21 0548 11/28/21 0912  BP: (!) 173/111 (!) 151/72 (!) 160/90 (!) 196/94  Pulse: 72 80 69 85  Resp: 14 16 16 16   Temp: 98 F (36.7 C) (!) 97.5 F (36.4 C) 97.6 F (36.4 C) 97.8 F (36.6 C)  TempSrc:  Oral Oral Oral  SpO2: 97% 98% 93% 95%  Weight:      Height:       Weight change: 0 kg  Intake/Output Summary (Last 24 hours) at 11/28/2021 0942 Last data filed at 11/28/2021 0900 Gross per 24 hour  Intake 420 ml  Output 400 ml  Net 20 ml        Labs: Basic Metabolic Panel: Recent Labs  Lab 11/26/21 0350 11/27/21 0518 11/28/21 0111  NA 137 138 137  K 3.4* 3.8 3.2*  CL 110 108 106  CO2 19* 20* 21*  GLUCOSE 103*  92 100*  BUN 30* 31* 35*  CREATININE 2.81* 2.74* 2.80*  CALCIUM 7.9* 8.6* 8.4*  PHOS 3.5 3.4 3.5    Liver Function Tests: Recent Labs  Lab 11/26/21 0350 11/27/21 0518 11/28/21 0111  ALBUMIN 2.2* 2.3* 2.1*    No results for input(s): LIPASE, AMYLASE in the last 168 hours. No results for input(s): AMMONIA in the last 168 hours. CBC: Recent Labs  Lab  11/24/21 0328 11/25/21 0241 11/26/21 0350 11/26/21 0737 11/26/21 1927 11/27/21 0518 11/28/21 0111  WBC 10.4 9.4 7.6  --   --  7.3 7.1  NEUTROABS 9.0* 8.1* 6.3  --   --  5.8 5.7  HGB 8.1* 7.2* 6.6*   < > 8.7* 8.6* 8.2*  HCT 24.2* 21.9* 20.0*   < > 26.6* 25.1* 24.0*  MCV 90.0 91.6 91.3  --   --  88.4 88.6  PLT 240 214 206  --   --  235 232   < > = values in this interval not displayed.    Cardiac Enzymes: No results for input(s): CKTOTAL, CKMB, CKMBINDEX, TROPONINI in the last 168 hours. CBG: No results for input(s): GLUCAP in the last 168 hours.  Iron Studies: No results for input(s): IRON, TIBC, TRANSFERRIN, FERRITIN in the last 72 hours. Studies/Results: DG Chest Port 1 View  Result Date: 11/26/2021 CLINICAL DATA:  Dyspnea, chest pain EXAM: PORTABLE CHEST 1 VIEW COMPARISON:  07/30/2021 FINDINGS: Diffuse bilateral interstitial and alveolar airspace opacities. Trace bilateral pleural effusions. No pneumothorax. Stable cardiomegaly. No acute osseous abnormality. Osteoarthritis of bilateral glenohumeral joints. IMPRESSION: 1. Diffuse bilateral interstitial and alveolar airspace opacities. Differential considerations include pulmonary edema of versus multilobar pneumonia versus ARDS. Electronically Signed   By: Kathreen Devoid M.D.   On: 11/26/2021 11:16    Medications: Infusions:  cefTRIAXone (ROCEPHIN)  IV 1 g (11/27/21 1420)    Scheduled Medications:  sodium chloride   Intravenous Once   carvedilol  3.125 mg Oral BID WC   Chlorhexidine Gluconate Cloth  6 each Topical Daily   docusate sodium  100 mg Oral BID   famotidine  20 mg Oral Daily   feeding supplement  237 mL Oral TID BM   fluconazole  100 mg Oral Daily   hydrALAZINE  100 mg Oral Q8H   hydroxychloroquine  200 mg Oral Daily   isosorbide mononitrate  60 mg Oral Daily   magnesium oxide  400 mg Oral QPM   multivitamin with minerals  1 tablet Oral Daily   pantoprazole  40 mg Oral BID   polyethylene glycol  17 g Oral TID    rOPINIRole  1 mg Oral QHS   rosuvastatin  40 mg Oral Daily   senna-docusate  2 tablet Oral QHS   sodium bicarbonate  650 mg Oral TID   sodium chloride flush  3 mL Intravenous Q12H   Vitamin D (Ergocalciferol)  50,000 Units Oral Q Thu    have reviewed scheduled and prn medications.  Physical Exam: General: Walking in the room, not in distress Heart:RRR, s1s2 nl Lungs: Basal minimal rhonchi, no increased work of breathing Abdomen:soft, Non-tender, non-distended Extremities:No peripheral edema Neurology: Alert, awake  Addeline Calarco Tanna Furry 11/28/2021,9:42 AM  LOS: 9 days

## 2021-11-28 NOTE — Plan of Care (Signed)
  Problem: Education: Goal: Knowledge of General Education information will improve Description: Including pain rating scale, medication(s)/side effects and non-pharmacologic comfort measures Outcome: Progressing   Problem: Nutrition: Goal: Adequate nutrition will be maintained Outcome: Progressing   

## 2021-11-29 ENCOUNTER — Encounter (HOSPITAL_COMMUNITY): Payer: Self-pay | Admitting: Internal Medicine

## 2021-11-29 DIAGNOSIS — N179 Acute kidney failure, unspecified: Secondary | ICD-10-CM | POA: Diagnosis not present

## 2021-11-29 LAB — CBC WITH DIFFERENTIAL/PLATELET
Abs Immature Granulocytes: 0.07 10*3/uL (ref 0.00–0.07)
Basophils Absolute: 0 10*3/uL (ref 0.0–0.1)
Basophils Relative: 1 %
Eosinophils Absolute: 0.3 10*3/uL (ref 0.0–0.5)
Eosinophils Relative: 4 %
HCT: 24.4 % — ABNORMAL LOW (ref 36.0–46.0)
Hemoglobin: 8 g/dL — ABNORMAL LOW (ref 12.0–15.0)
Immature Granulocytes: 1 %
Lymphocytes Relative: 8 %
Lymphs Abs: 0.6 10*3/uL — ABNORMAL LOW (ref 0.7–4.0)
MCH: 29.9 pg (ref 26.0–34.0)
MCHC: 32.8 g/dL (ref 30.0–36.0)
MCV: 91 fL (ref 80.0–100.0)
Monocytes Absolute: 0.5 10*3/uL (ref 0.1–1.0)
Monocytes Relative: 8 %
Neutro Abs: 5.7 10*3/uL (ref 1.7–7.7)
Neutrophils Relative %: 78 %
Platelets: 236 10*3/uL (ref 150–400)
RBC: 2.68 MIL/uL — ABNORMAL LOW (ref 3.87–5.11)
RDW: 16.7 % — ABNORMAL HIGH (ref 11.5–15.5)
WBC: 7.2 10*3/uL (ref 4.0–10.5)
nRBC: 0 % (ref 0.0–0.2)

## 2021-11-29 LAB — ANCA TITERS
Atypical P-ANCA titer: <1:20 {titer}
C-ANCA: <1:20 {titer}
P-ANCA: 1:640 {titer} — ABNORMAL HIGH

## 2021-11-29 LAB — RENAL FUNCTION PANEL
Albumin: 2.8 g/dL — ABNORMAL LOW (ref 3.5–5.0)
Anion gap: 10 (ref 5–15)
BUN: 35 mg/dL — ABNORMAL HIGH (ref 8–23)
CO2: 22 mmol/L (ref 22–32)
Calcium: 8.7 mg/dL — ABNORMAL LOW (ref 8.9–10.3)
Chloride: 105 mmol/L (ref 98–111)
Creatinine, Ser: 2.78 mg/dL — ABNORMAL HIGH (ref 0.44–1.00)
GFR, Estimated: 17 mL/min — ABNORMAL LOW (ref >60)
Glucose, Bld: 92 mg/dL (ref 70–99)
Phosphorus: 3.5 mg/dL (ref 2.5–4.6)
Potassium: 3.5 mmol/L (ref 3.5–5.1)
Sodium: 137 mmol/L (ref 135–145)

## 2021-11-29 LAB — PROTEIN ELECTROPHORESIS, SERUM
A/G Ratio: 0.7 (ref 0.7–1.7)
Albumin ELP: 2.5 g/dL — ABNORMAL LOW (ref 2.9–4.4)
Alpha-1-Globulin: 0.5 g/dL — ABNORMAL HIGH (ref 0.0–0.4)
Alpha-2-Globulin: 0.9 g/dL (ref 0.4–1.0)
Beta Globulin: 0.7 g/dL (ref 0.7–1.3)
Gamma Globulin: 1.5 g/dL (ref 0.4–1.8)
Globulin, Total: 3.6 g/dL (ref 2.2–3.9)
Total Protein ELP: 6.1 g/dL (ref 6.0–8.5)

## 2021-11-29 MED ORDER — SENNOSIDES-DOCUSATE SODIUM 8.6-50 MG PO TABS
2.0000 | ORAL_TABLET | Freq: Every day | ORAL | 2 refills | Status: AC
Start: 2021-11-29 — End: 2022-02-27

## 2021-11-29 MED ORDER — AMLODIPINE BESYLATE 5 MG PO TABS
5.0000 mg | ORAL_TABLET | Freq: Every day | ORAL | Status: DC
  Administered 2021-11-29: 5 mg via ORAL
  Filled 2021-11-29: qty 1

## 2021-11-29 MED ORDER — CEPHALEXIN 250 MG PO CAPS
250.0000 mg | ORAL_CAPSULE | Freq: Two times a day (BID) | ORAL | 0 refills | Status: AC
Start: 2021-11-29 — End: 2021-12-02

## 2021-11-29 MED ORDER — SODIUM BICARBONATE 650 MG PO TABS: 650.0000 mg | ORAL_TABLET | Freq: Every day | ORAL | 2 refills | Status: AC

## 2021-11-29 MED ORDER — ONDANSETRON HCL 4 MG PO TABS
4.0000 mg | ORAL_TABLET | Freq: Four times a day (QID) | ORAL | 0 refills | Status: AC | PRN
Start: 2021-11-29 — End: 2021-12-04

## 2021-11-29 MED ORDER — PANTOPRAZOLE SODIUM 40 MG PO TBEC: 40.0000 mg | DELAYED_RELEASE_TABLET | Freq: Two times a day (BID) | ORAL | 1 refills | Status: DC

## 2021-11-29 MED ORDER — CARVEDILOL 3.125 MG PO TABS: 3.1250 mg | ORAL_TABLET | Freq: Two times a day (BID) | ORAL | 2 refills | Status: DC

## 2021-11-29 MED ORDER — FLUCONAZOLE 100 MG PO TABS: 100.0000 mg | ORAL_TABLET | Freq: Every day | ORAL | 0 refills | Status: AC

## 2021-11-29 MED ORDER — ISOSORBIDE MONONITRATE ER 60 MG PO TB24: 60.0000 mg | ORAL_TABLET | Freq: Every day | ORAL | 2 refills | Status: DC

## 2021-11-29 MED ORDER — AMLODIPINE BESYLATE 5 MG PO TABS: 5.0000 mg | ORAL_TABLET | Freq: Every day | ORAL | 2 refills | Status: DC

## 2021-11-29 NOTE — Progress Notes (Signed)
Physical Therapy Treatment Patient Details Name: Kim [name] MRN: [id number] DOB: 08-Dec-1945 Today's Date: 11/29/2021   History of Present Illness Kim [name] is a 76 y.o. female with medical history significant of SLE with rheumatoid arthritis, recent left cerebral artery embolic CVA hospitalized both in December 2022 and once again in January 2023, hypertension, heart failure with preserved EF/diastolic dysfunction and dyslipidemia, presented to the ER with complaints of constipation and rectal pain. Admitted with dx: AKI and is now s/p flexible sigmoidoscopy on 11/21/21.    PT Comments    Continuing work on functional mobility and activity tolerance;  session focused on close monitor of O2 sats during progressive amb on room air to make determination re: need for supplemental O2 at home; O2 sats stayed greater than or equal to 93% throughout walk; no need for supplemental O2   Recommendations for follow up therapy are one component of a multi-disciplinary discharge planning process, led by the attending physician.  Recommendations may be updated based on patient status, additional functional criteria and insurance authorization.  Follow Up Recommendations  Home health PT     Assistance Recommended at Discharge Set up Supervision/Assistance  Patient can return home with the following A little help with walking and/or transfers;A little help with bathing/dressing/bathroom;Help with stairs or ramp for entrance   Equipment Recommendations  BSC/3in1;Rollator (4 wheels)    Recommendations for Other Services       Precautions / Restrictions Precautions Precautions: Fall     Mobility  Bed Mobility Overal bed mobility: Needs Assistance Bed Mobility: Supine to Sit     Supine to sit: Min guard     General bed mobility comments: Minguard for safety    Transfers Overall transfer level: Needs assistance Equipment used: Rollator (4 wheels) Transfers: Sit to/from Stand Sit to  Stand: Min guard           General transfer comment: for safety; cues for brake use    Ambulation/Gait Ambulation/Gait assistance: Min guard (with and without physical contact) Gait Distance (Feet): 300 Feet Assistive device: Rollator (4 wheels) Gait Pattern/deviations: Step-through pattern, Decreased stride length       General Gait Details: Took a few standing rest breaks, but no need to sit during walk; monitored O2 sats throughout, and maintained 93% or greater throughout walk   Stairs             Wheelchair Mobility    Modified Rankin (Stroke Patients Only)       Balance     Sitting balance-Leahy Scale: Good       Standing balance-Leahy Scale: Poor Standing balance comment: reliant on walker                            Cognition Arousal/Alertness: Awake/alert Behavior During Therapy: WFL for tasks assessed/performed Overall Cognitive Status: Within Functional Limits for tasks assessed                                 General Comments: patient following commands with daughter in law interpreting but does understand some english        Exercises      General Comments General comments (skin integrity, edema, etc.): VSS      Pertinent Vitals/Pain Pain Assessment Pain Assessment: No/denies pain Pain Intervention(s): Monitored during session    Home Living  Prior Function            PT Goals (current goals can now be found in the care plan section) Acute Rehab PT Goals Patient Stated Goal: to go home PT Goal Formulation: With patient Time For Goal Achievement: 12/06/21 Potential to Achieve Goals: Good Progress towards PT goals: Progressing toward goals    Frequency    Min 3X/week      PT Plan Current plan remains appropriate    Co-evaluation              AM-PAC PT "6 Clicks" Mobility   Outcome Measure  Help needed turning from your back to your side while in  a flat bed without using bedrails?: None Help needed moving from lying on your back to sitting on the side of a flat bed without using bedrails?: None Help needed moving to and from a bed to a chair (including a wheelchair)?: A Little Help needed standing up from a chair using your arms (e.g., wheelchair or bedside chair)?: A Little Help needed to walk in hospital room?: A Little Help needed climbing 3-5 steps with a railing? : A Little 6 Click Score: 20    End of Session Equipment Utilized During Treatment: Other (comment) (vitals machine) Activity Tolerance: Patient tolerated treatment well Patient left: in bed;with call bell/phone within reach;with family/visitor present (sitting EOB) Nurse Communication: Mobility status PT Visit Diagnosis: Muscle weakness (generalized) (M62.81)     Time: 1249-1310 PT Time Calculation (min) (ACUTE ONLY): 21 min  Charges:  $Gait Training: 8-22 mins                     Roney Marion, [place] [phone]    Colletta Maryland 11/29/2021, 2:22 PM

## 2021-11-29 NOTE — Progress Notes (Signed)
DISCHARGE NOTE HOME [facility] to be discharged Home per MD order. Discussed prescriptions and follow up appointments with the patient. Prescriptions given to patient; medication list explained in detail. Patient verbalized understanding.  Skin clean, dry and intact without evidence of skin break down, no evidence of skin tears noted. IV catheter discontinued intact. Site without signs and symptoms of complications. Dressing and pressure applied. Pt denies pain at the site currently. No complaints noted.  Patient free of lines, drains, and wounds.   An After Visit Summary (AVS) was printed and given to the patient. Patient escorted via wheelchair, and discharged home via private auto.  Arlyss Repress, RN

## 2021-11-29 NOTE — Progress Notes (Addendum)
[name] NEPHROLOGY PROGRESS NOTE  Assessment/ Plan: Pt is a 76 y.o. yo female   with a PMH significant for SLE, rheumatoid arthritis, HTN, HLD, chronic diastolic CHF, left cerebral artery embolic CVA, who presented to [facility] ED on 11/18/21 complaining of constipation, rectal pain, N/V and not eating much, seen as a consultation for AKI.  Hospital course complicated by acute renal retention, stercoral proctitis and severe anemia requiring blood transfusion.  1) Acute kidney injury, nonoliguric likely due to ischemic ATN/volume depletion with h/o N/V, reduced oral intake, acute urine retention from fecal impaction, UTI and severe anemia.  UA with some protein, RBC's, WBC's.   CT scan reports distended bladder but unremarkable kidneys.    Serologies including ANA, anti-dsDNA, C3, C4 negative.  ANCA and protein electrophoresis still pending. Urine with RBC + WBC's + proteinuria (100mg /dl) which is new; no proteinuria in 07/2021.  She continues to have GI issue with fullness more than nausea w/ poor appetite.    I discussed a kidney biopsy procedure with the daughter in law who was bedside, she will d/w the daughter in [place] but they were not interested in a biopsy. I did educate that bec her renal fx is stable, C3C4 nl that it's ok to hold on a bx. But if repeat chemistry panel shows cr that is still elevated at 2.7-3 + persistent hematuria and proteinuria in 2-4 weeks I would highly recommend a renal biopsy as her Cr was 0.6-0.8 in Jan 2023.   Office ([facility]) will call pt with a hospital f/u appt scheduled for 2-3 weeks.  Continue to hold losartan; agree with restarting Norvasc 5mg  daily and using compression stockings. Ceftriaxone for UTI; send for urine culture as well (RBC + WBC from foley or UTI?). Strict ins and out, daily lab and watch for renal recovery -> renal function stable at 2.7-2.8. Could this be a new baseline? Too early to tell but cr was 0.6-0.8 range Dec 2022 to Jan of  2023.  2) Severe anemia/acute blood loss anemia/rectal bleeding: Received blood transfusion and hemoglobin has improved.  Flexible sigmoidoscopy on 5/14 with distal rectal stercoral ulcer.  Eliquis on hold and currently on PPI.  3) Acute urinary retention likely due to severe constipation status post Foley catheter which was later removed.  Continue bladder scan.  4) Metabolic acidosis: Increased sodium bicarbonate to 3 times daily.  Monitor CO2 level.  5) Hypokalemia: Replete potassium chloride.  6) Rectal impaction with stercoral proctitis/constipation: Status post flexible sigmoidoscopy.  Stool softener per primary team.  7) Hypertension: Blood pressure elevated.  They are concerned about ankle edema and amlodipine.  I will discontinue amlodipine and increase isosorbide to 60 mg.  She is also on hydralazine.  8) Shortness of breath/wheezing: Chest x-ray with possible pulmonary edema or pneumonia.  Received Lasix on 5/19 with some improvement.  Holding diuretics as he is not eating much and looks intravascularly volume depleted.  9) Gastritis, large hiatal hernia, esophageal lesion seen on endoscopy.  Per GI plan to treat with PPI and fluconazole.  Subjective: Seen and examined at bedside.  Mild shortness of breath yesterday but no chest pain.  Still having abd fullness very quickly with food intake but less nausea and still w/ decreased oral intake. Her daughter-in-law was present at the bedside.  Objective Vital signs in last 24 hours: Vitals:   11/28/21 0912 11/28/21 1620 11/28/21 2026 11/29/21 0452  BP: (!) 196/94 (!) 155/102 (!) 157/97 (!) 167/98  Pulse: 85 62 69 70  Resp: 16 15 20 18   Temp: 97.8 F (36.6 C)  97.6 F (36.4 C) 97.6 F (36.4 C)  TempSrc: Oral  Oral Axillary  SpO2: 95% 98% 100% 94%  Weight:    55.2 kg  Height:       Weight change: -1.6 kg  Intake/Output Summary (Last 24 hours) at 11/29/2021 0746 Last data filed at 11/28/2021 2200 Gross per 24 hour  Intake  1482.86 ml  Output --  Net 1482.86 ml       Labs: Basic Metabolic Panel: Recent Labs  Lab 11/27/21 0518 11/28/21 0111 11/29/21 0327  NA 138 137 137  K 3.8 3.2* 3.5  CL 108 106 105  CO2 20* 21* 22  GLUCOSE 92 100* 92  BUN 31* 35* 35*  CREATININE 2.74* 2.80* 2.78*  CALCIUM 8.6* 8.4* 8.7*  PHOS 3.4 3.5 3.5   Liver Function Tests: Recent Labs  Lab 11/27/21 0518 11/28/21 0111 11/29/21 0327  ALBUMIN 2.3* 2.1* 2.8*   No results for input(s): LIPASE, AMYLASE in the last 168 hours. No results for input(s): AMMONIA in the last 168 hours. CBC: Recent Labs  Lab 11/25/21 0241 11/26/21 0350 11/26/21 0737 11/27/21 0518 11/28/21 0111 11/29/21 0327  WBC 9.4 7.6  --  7.3 7.1 7.2  NEUTROABS 8.1* 6.3  --  5.8 5.7 5.7  HGB 7.2* 6.6*   < > 8.6* 8.2* 8.0*  HCT 21.9* 20.0*   < > 25.1* 24.0* 24.4*  MCV 91.6 91.3  --  88.4 88.6 91.0  PLT 214 206  --  235 232 236   < > = values in this interval not displayed.   Cardiac Enzymes: No results for input(s): CKTOTAL, CKMB, CKMBINDEX, TROPONINI in the last 168 hours. CBG: No results for input(s): GLUCAP in the last 168 hours.  Iron Studies: No results for input(s): IRON, TIBC, TRANSFERRIN, FERRITIN in the last 72 hours. Studies/Results: No results found.  Medications: Infusions:  cefTRIAXone (ROCEPHIN)  IV Stopped (11/28/21 1041)    Scheduled Medications:  sodium chloride   Intravenous Once   carvedilol  3.125 mg Oral BID WC   Chlorhexidine Gluconate Cloth  6 each Topical Daily   docusate sodium  100 mg Oral BID   famotidine  20 mg Oral Daily   feeding supplement  237 mL Oral TID BM   fluconazole  100 mg Oral Daily   hydrALAZINE  100 mg Oral Q8H   hydroxychloroquine  200 mg Oral Daily   isosorbide mononitrate  60 mg Oral Daily   magnesium oxide  400 mg Oral QPM   multivitamin with minerals  1 tablet Oral Daily   pantoprazole  40 mg Oral BID   polyethylene glycol  17 g Oral TID   rOPINIRole  1 mg Oral QHS   rosuvastatin   40 mg Oral Daily   senna-docusate  2 tablet Oral QHS   sodium bicarbonate  650 mg Oral TID   sodium chloride flush  3 mL Intravenous Q12H   Vitamin D (Ergocalciferol)  50,000 Units Oral Q Thu    have reviewed scheduled and prn medications.  Physical Exam: General: Supine in bed, not in distress Heart:RRR, s1s2 nl Lungs: Basal minimal rhonchi, no increased work of breathing Abdomen:soft, Non-tender, non-distended Extremities:No peripheral edema Neurology: Alert, awake  Eddith Mentor W 11/29/2021,7:46 AM  LOS: 10 days

## 2021-11-29 NOTE — Discharge Summary (Addendum)
Physician Discharge Summary  Kim [name] [id number] DOB: 08/23/1945 DOA: 11/18/2021  PCP: Curt Jews, PA-C  Admit date: 11/18/2021 Discharge date: 11/29/2021  Admitted From: Home Discharge disposition: Home  Recommendations at discharge:  Your blood pressure medications have been changed.  Please continue to monitor blood pressure at home. Stop Eliquis.  Continue Protonix for 8 weeks, fluconazole for next 10 days. Follow-up with nephrologist as an outpatient  Brief narrative: Kim [name] is a 76 y.o. female with PMH significant for SLE with rheumatoid arthritis, HTN, HLD, diastolic CHF, A-fib, recent left cerebral artery embolic CVA hospitalized both in December 2022 and once again in January 2023. Patient presented to the ED on 5/11 with complaint of constipation and rectal pain ongoing for several days not responding to Dulcolax suppositories. She had implantable cardiac monitor in place after recent stroke and was recently diagnosed with atrial fibrillation and started on Eliquis on 5/2.   Labs in the ED showed an elevated BUN/creatinine of 55/4.33 (baseline normal) and a low sodium of 132 and low serum bicarb level of 18. CT abdomen and pelvis showed large volume of stool within the rectal vault with superimposed inflammatory changes suggesting fecal impaction with associated stercoral proctitis and a moderate rectal prolapse. She was also noted with bladder distention with a volume of 786 cc so a Foley catheter was placed.   Admitted to hospitalist service  Subjective: Patient was seen and examined this morning.  Elderly female.  Lying on bed.  Not in distress.  Slightly nauseous. Sister at bedside. I discussed her case with nephrologist Dr. Otelia Santee this morning.  On patient is appropriate for discharge home with medication adjustment.  Discussed with patient's sister Pam on the phone.  Principal Problem:   AKI (acute kidney injury) ([place]) Active Problems:   HTN  (hypertension)   Mixed hyperlipidemia   Restless leg syndrome   Systemic lupus erythematosus (HCC)   (HFpEF) heart failure with preserved ejection fraction (HCC)   Anemia   Constipation   Stercoral ulcer of anus   Dehydration   Atrial fibrillation, chronic (HCC)   History of cardioembolic cerebrovascular accident (CVA)   Acute kidney injury ([place])   Fecal impaction (HCC)   Gastrointestinal hemorrhage    Assessment and Plan: Acute kidney injury Metabolic acidosis -Presented with creatinine elevated to 4.3. Baseline creatinine less than 1. Creatinine level seems to have stabilized in the ballpark of 2.8 for last 5 days.  Nephrology consult appreciated.   -Patient was offered renal biopsy but family wanted to wait for next few weeks.  She will follow-up with nephrology as an outpatient. -Serum bicarb level improving on bicarbonate tablets. Recent Labs    11/20/21 0930 11/21/21 0706 11/22/21 0444 11/23/21 0511 11/24/21 0328 11/25/21 0241 11/26/21 0350 11/27/21 0518 11/28/21 0111 11/29/21 0327  BUN 48* 43* 37* 29* 27* 28* 30* 31* 35* 35*  CREATININE 3.81* 3.48* 3.16* 2.99* 2.83* 2.99* 2.81* 2.74* 2.80* 2.78*  CO2 15* 17* 19* 19* 15* 17* 19* 20* 21* 22    Acute blood loss anemia -Initially patient was suspected to have bleeding from stercoral ulcer but no bleeding was identified on sigmoidoscopy.   -5/20, patient underwent EGD and was found to have a large hiatal hernia, gastritis, as well as new liver lesion and esophagus mucosa suspicious of fungal infection.  GI started the patient on PPI twice daily for 8 weeks, fluconazole 100 mg daily for 2 weeks. -Eliquis on hold.  -Hemoglobin stable over 8.  Iron, vitamin B12  and folate levels are adequate. Recent Labs    11/19/21 0926 11/20/21 0930 11/26/21 0737 11/26/21 1927 11/27/21 0518 11/28/21 0111 11/29/21 0327  HGB 6.3*   < > 7.4* 8.7* 8.6* 8.2* 8.0*  MCV 92.9   < >  --   --  88.4 88.6 91.0  VITAMINB12 766  --   --   --    --   --   --   FOLATE 8.8  --   --   --   --   --   --   FERRITIN 189  --   --   --   --   --   --   TIBC 203*  --   --   --   --   --   --   IRON 31  --   --   --   --   --   --   RETICCTPCT 2.0  --   --   --   --   --   --    < > = values in this interval not displayed.   Acute urinary retention Gross hematuria -On admission, she had a urine retention of more than 700 mL.  Likely due to severe constipation.  Urinalysis also showed hematuria.  She had Foley catheter in place which were successfully removed on 5/15.  Voiding trial successful.   -5/17, urinalysis was repeated which showed yellow urine but large amount of hemoglobin and RBC.  5/19, discussed with urologist Dr. Gloriann Loan.  Without evidence of stone or mass in the CT abdomen and pelvis, he doubts that patient needs any urological intervention at this time.  While her bladder was distended, she might have tore some capillaries leading to hematuria.  Hematuria improving.  Rectal impaction with stercoral proctitis/focal constipation -Primary complaint of constipation.  Partially disimpacted by EDP with some blood noted during disimpaction.  Seen by GI.  S/p flexible sigmoidoscopy on 11/21/2021 which showed stercoral distal rectal ulcer without visible vessel or current bleeding.  Otherwise normal.   -GI recommended avoiding constipation with daily Senokot, MiraLAX. -5/17, abdominal x-ray with no more retention stool. -Having regular bowel movements now.  Volume overload Acute exacerbation of chronic diastolic CHF Acute respiratory failure with hypoxia -Patient required significant amount of IV fluid because of AKI.  Chest x-ray obtained on 5/19 showed significant amount of volume overload.  Lasix 1 dose was given by nephrology.  Respiratory status improving.  Not on supplemental oxygen at this time.    Essential hypertension -She was previously on losartan which was stopped because of AKI.  Currently continued on Coreg, imdur,  hydralazine.  Blood pressure still remains elevated.  Discussed with nephrologist.  We will add amlodipine 5 mg daily to her regimen.  Unable to resume ACEI or ARB at this time because of AKI.  If her creatinine improves down the road, it would be an option.  Paroxysmal atrial fibrillation -Currently rate controlled on low-dose Coreg. -Eliquis was initially held.  It was resumed on 5/15.  With worsening anemia, I stopped Eliquis again on 5/18.  I would not resume Eliquis until hemoglobin normalizes in the next several days.   Recent left cerebellar artery embolic CVA December [id number]:  -Previously in aspirin and Plavix.  After the cardiac monitor showed A-fib, she was switched to Eliquis on 5/2 by her outpatient cardiologist.  Eliquis on hold as well at this time.  Continue Crestor.  Failure to thrive in adult -Previously had been on  Megace without any response to this medication so it was discontinued according to daughter -Patient encouraged to drink more fluids as opposed to eating significant volumes of food.     SLE/RA Continue Plaquenil  Goals of care   Code Status: Full Code    Mobility: Encourage ambulation  Skin assessment:     Nutritional status:  Body mass index is 23.77 kg/m.  Nutrition Problem: Inadequate oral intake Etiology: poor appetite Signs/Symptoms: per patient/family report      Wounds:  -    Discharge Exam:   Vitals:   11/28/21 2026 11/29/21 0452 11/29/21 0846 11/29/21 1059  BP: (!) 157/97 (!) 167/98 (!) 171/109 (!) 178/99  Pulse: 69 70 85 75  Resp: 20 18 16 20   Temp: 97.6 F (36.4 C) 97.6 F (36.4 C) 97.7 F (36.5 C)   TempSrc: Oral Axillary Oral   SpO2: 100% 94% 94% 96%  Weight:  55.2 kg    Height:        Body mass index is 23.77 kg/m.  General exam: Pleasant, elderly Caucasian female.  Not in physical distress. Skin: No rashes, lesions or ulcers. HEENT: Atraumatic, normocephalic, no obvious bleeding Lungs: Clear to auscultation  bilaterally. CVS: Regular rate and rhythm, no murmur GI/Abd soft, nontender, nondistended, bowel sound present CNS: Alert, awake, oriented x3 Psychiatry: Mood appropriate Extremities: Trace pedal edema on the right, no calf tenderness  Follow ups:    [facility].   Specialty: Home Health Services Why: Someone will call you to schedule first home visit. Contact information: [place] [address] [phone]         [facility], PA-C Follow up.   Specialty: Physician Assistant Contact information: [id number] [place] [address] [phone]                 Discharge Instructions:   Discharge Instructions     Call MD for:  difficulty breathing, headache or visual disturbances   Complete by: As directed    Call MD for:  extreme fatigue   Complete by: As directed    Call MD for:  hives   Complete by: As directed    Call MD for:  persistant dizziness or light-headedness   Complete by: As directed    Call MD for:  persistant nausea and vomiting   Complete by: As directed    Call MD for:  severe uncontrolled pain   Complete by: As directed    Call MD for:  temperature >100.4   Complete by: As directed    Diet - low sodium heart healthy   Complete by: As directed    Diet general   Complete by: As directed    Discharge instructions   Complete by: As directed    Recommendations at discharge:   Your blood pressure medications have been changed.  Please continue to monitor blood pressure at home.  Stop Eliquis.  Continue Protonix for 8 weeks, fluconazole for next 10 days.  Follow-up with nephrologist as an outpatient  General discharge instructions: Follow with Primary MD Curt Jews, PA-C in 7 days  Please request your PCP  to go over your hospital tests, procedures, radiology results at the follow up. Please get your medicines reviewed and  adjusted.  Your PCP may decide to repeat certain labs or tests as needed. Do not drive, operate heavy machinery, perform activities at  heights, swimming or participation in water activities or provide baby sitting services if your were admitted for syncope or siezures until you have seen by Primary MD or a Neurologist and advised to do so again. [place] Controlled Substance Reporting System database was reviewed. Do not drive, operate heavy machinery, perform activities at heights, swim, participate in water activities or provide baby-sitting services while on medications for pain, sleep and mood until your outpatient physician has reevaluated you and advised to do so again.  You are strongly recommended to comply with the dose, frequency and duration of prescribed medications. Activity: As tolerated with Full fall precautions use walker/cane & assistance as needed Avoid using any recreational substances like cigarette, tobacco, alcohol, or non-prescribed drug. If you experience worsening of your admission symptoms, develop shortness of breath, life threatening emergency, suicidal or homicidal thoughts you must seek medical attention immediately by calling 911 or calling your MD immediately  if symptoms less severe. You must read complete instructions/literature along with all the possible adverse reactions/side effects for all the medicines you take and that have been prescribed to you. Take any new medicine only after you have completely understood and accepted all the possible adverse reactions/side effects.  Wear Seat belts while driving. You were cared for by a hospitalist during your hospital stay. If you have any questions about your discharge medications or the care you received while you were in the hospital after you are discharged, you can call the unit and ask to speak with the hospitalist or the covering physician. Once you are discharged, your primary care physician will handle any  further medical issues. Please note that NO REFILLS for any discharge medications will be authorized once you are discharged, as it is imperative that you return to your primary care physician (or establish a relationship with a primary care physician if you do not have one).   Increase activity slowly   Complete by: As directed        Discharge Medications:   Allergies as of 11/29/2021       Reactions   Beef-derived Products Other (See Comments)   Vegetarian  Does not eat due to religious reasons   Beta Adrenergic Blockers Other (See Comments)   Bradycardia   Bystolic [nebivolol Hcl] Other (See Comments)   Bradycardia    Chicken Protein Other (See Comments)   Vegetarian Does not eat due to religious reasons   Fish-derived Products Other (See Comments)   Vegetarian  Does not eat due to religious reasons   Neurontin [gabapentin] Other (See Comments)   Unknown reaction   Norvasc [amlodipine] Swelling   Pork-derived Products Other (See Comments)   Vegetarian (pt accepts heparin products 07/31/21) Does not eat due to religious reasons   Trandate [labetalol] Other (See Comments)   Bradycardia   Zestril [lisinopril] Other (See Comments)   Cough         Medication List     STOP taking these medications    acetaminophen 325 MG tablet Commonly known as: TYLENOL   apixaban 5 MG Tabs tablet Commonly known as: Eliquis   losartan 100 MG tablet Commonly known as: COZAAR       TAKE these medications    amLODipine 5 MG tablet Commonly known as: NORVASC Take 1 tablet (5 mg total) by mouth daily.   carvedilol 3.125 MG tablet Commonly known as: COREG Take 1 tablet (3.125 mg total) by mouth 2 (two) times daily with a meal.   cephALEXin 250 MG capsule  Commonly known as: KEFLEX Take 1 capsule (250 mg total) by mouth 2 (two) times daily for 3 days.   clotrimazole-betamethasone cream Commonly known as: LOTRISONE Apply 1 application. topically as needed (rash).    famotidine 20 MG tablet Commonly known as: PEPCID Take 20 mg by mouth daily.   FLEET ENEMA RE Place 1 enema rectally daily as needed (constipation).   fluconazole 100 MG tablet Commonly known as: DIFLUCAN Take 1 tablet (100 mg total) by mouth daily for 10 days. Start taking on: Nov 30, 2021   hydrALAZINE 50 MG tablet Commonly known as: APRESOLINE Take 50 mg by mouth in the morning and at bedtime.   hydroxychloroquine 200 MG tablet Commonly known as: PLAQUENIL Take 200 mg by mouth daily.   isosorbide mononitrate 60 MG 24 hr tablet Commonly known as: IMDUR Take 1 tablet (60 mg total) by mouth daily. Start taking on: Nov 30, 2021   magnesium oxide 400 MG tablet Commonly known as: MAG-OX Take 400 mg by mouth every evening.   ondansetron 4 MG tablet Commonly known as: ZOFRAN Take 1 tablet (4 mg total) by mouth every 6 (six) hours as needed for up to 5 days for nausea.   pantoprazole 40 MG tablet Commonly known as: PROTONIX Take 1 tablet (40 mg total) by mouth 2 (two) times daily.   polyethylene glycol 17 g packet Commonly known as: MIRALAX / GLYCOLAX Take 17 g by mouth daily as needed for mild constipation.   rOPINIRole 1 MG tablet Commonly known as: REQUIP Take 1 mg by mouth at bedtime.   rosuvastatin 40 MG tablet Commonly known as: CRESTOR Take 1 tablet (40 mg total) by mouth daily.   senna-docusate 8.6-50 MG tablet Commonly known as: Senokot-S Take 1 tablet by mouth every morning. What changed: Another medication with the same name was added. Make sure you understand how and when to take each.   senna-docusate 8.6-50 MG tablet Commonly known as: Senokot-S Take 2 tablets by mouth at bedtime. What changed: You were already taking a medication with the same name, and this prescription was added. Make sure you understand how and when to take each.   sodium bicarbonate 650 MG tablet Take 1 tablet (650 mg total) by mouth daily.   traMADol 50 MG tablet Commonly  known as: ULTRAM Take 25 mg by mouth at bedtime as needed for moderate pain.   Vitamin D (Ergocalciferol) 1.25 MG (50000 UNIT) Caps capsule Commonly known as: DRISDOL Take 50,000 Units by mouth every Thursday.               Durable Medical Equipment  (From admission, onward)           Start     Ordered   11/23/21 1514  For home use only DME 3 n 1  Once        11/23/21 1513   11/23/21 1514  For home use only DME 4 wheeled rolling walker with seat  Once       Question:  Patient needs a walker to treat with the following condition  Answer:  Gait instability   11/23/21 1513   11/23/21 1513  For home use only DME lightweight manual wheelchair with seat cushion  Once       Comments: Patient suffers from Weakness which impairs their ability to perform daily activities like bathing, dressing, and toileting in the home.  A cane or walker will not resolve  issue with performing activities of daily living. A wheelchair will  allow patient to safely perform daily activities. Patient is not able to propel themselves in the home using a standard weight wheelchair due to general weakness. Patient can self propel in the lightweight wheelchair. Length of need 12 months . Accessories: elevating leg rests (ELRs), wheel locks, extensions and anti-tippers.   11/23/21 1513             The results of significant diagnostics from this hospitalization (including imaging, microbiology, ancillary and laboratory) are listed below for reference.    Procedures and Diagnostic Studies:   CT Abdomen Pelvis Wo Contrast  Result Date: 11/18/2021 CLINICAL DATA:  Bowel obstruction, rectal pain, fecal incontinence EXAM: CT ABDOMEN AND PELVIS WITHOUT CONTRAST TECHNIQUE: Multidetector CT imaging of the abdomen and pelvis was performed following the standard protocol without IV contrast. RADIATION DOSE REDUCTION: This exam was performed according to the departmental dose-optimization program which includes  automated exposure control, adjustment of the mA and/or kV according to patient size and/or use of iterative reconstruction technique. COMPARISON:  02/08/2019 FINDINGS: Lower chest: The visualized lung bases are clear. Moderate coronary artery calcification. Moderate hiatal hernia. Hepatobiliary: No focal liver abnormality is seen. No gallstones, gallbladder wall thickening, or biliary dilatation. Pancreas: Unremarkable Spleen: Unremarkable Adrenals/Urinary Tract: The adrenal glands are unremarkable. The kidneys are normal on this noncontrast examination. The bladder is distended with an estimated bladder volume of approximately 786 cc. The bladder is otherwise unremarkable. Stomach/Bowel: Large volume stool is seen within the rectal vault and there is mild circumferential rectal wall thickening and perirectal inflammatory stranding suggesting changes of stercoral proctitis. There is superimposed pelvic floor relaxation with moderate rectal prolapse descending roughly 5 cm below the pubococcygeal line. The stomach, small bowel, and large bowel are otherwise unremarkable. Appendix normal. No free intraperitoneal gas or fluid. Vascular/Lymphatic: Aortic atherosclerosis. No enlarged abdominal or pelvic lymph nodes. Reproductive: Uterus and bilateral adnexa are unremarkable. Other: No abdominal wall hernia. Musculoskeletal: Remote L1 compression deformity with progressive loss of height noted and resultant focal kyphosis at the thoracolumbar junction. No retropulsion. Advanced degenerative changes noted throughout the lumbar spine. Transitional lumbar anatomy with sacralization of L5 noted. No acute bone abnormality. IMPRESSION: Large volume stool within the rectal vault with superimposed inflammatory changes suggesting fecal impaction and/or stercoral proctitis. No evidence of perforation. Superimposed moderate rectal prolapse. Bladder distension within estimated bladder volume of 786 cc. This may relate to voluntary  retention or bladder outlet obstruction. Progressive loss of height of remote L1 compression deformity now with resultant focal kyphosis at this level. No retropulsion. Aortic Atherosclerosis (ICD10-I70.0). Electronically Signed   By: Fidela Salisbury M.D.   On: 11/18/2021 20:20     Labs:   Basic Metabolic Panel: Recent Labs  Lab 11/23/21 0511 11/24/21 0328 11/25/21 0241 11/26/21 0350 11/27/21 0518 11/28/21 0111 11/29/21 0327  NA 135 136 136 137 138 137 137  K 2.9* 3.5 3.3* 3.4* 3.8 3.2* 3.5  CL 106 111 110 110 108 106 105  CO2 19* 15* 17* 19* 20* 21* 22  GLUCOSE 97 112* 114* 103* 92 100* 92  BUN 29* 27* 28* 30* 31* 35* 35*  CREATININE 2.99* 2.83* 2.99* 2.81* 2.74* 2.80* 2.78*  CALCIUM 7.7* 7.6* 7.9* 7.9* 8.6* 8.4* 8.7*  MG 1.5* 2.1  --   --   --   --   --   PHOS  --   --   --  3.5 3.4 3.5 3.5   GFR Estimated Creatinine Clearance: 13.4 mL/min (A) (by C-G formula based on SCr  of 2.78 mg/dL (H)). Liver Function Tests: Recent Labs  Lab 11/26/21 0350 11/27/21 0518 11/28/21 0111 11/29/21 0327  ALBUMIN 2.2* 2.3* 2.1* 2.8*   No results for input(s): LIPASE, AMYLASE in the last 168 hours. No results for input(s): AMMONIA in the last 168 hours. Coagulation profile No results for input(s): INR, PROTIME in the last 168 hours.  CBC: Recent Labs  Lab 11/25/21 0241 11/26/21 0350 11/26/21 0737 11/26/21 1927 11/27/21 0518 11/28/21 0111 11/29/21 0327  WBC 9.4 7.6  --   --  7.3 7.1 7.2  NEUTROABS 8.1* 6.3  --   --  5.8 5.7 5.7  HGB 7.2* 6.6* 7.4* 8.7* 8.6* 8.2* 8.0*  HCT 21.9* 20.0* 22.6* 26.6* 25.1* 24.0* 24.4*  MCV 91.6 91.3  --   --  88.4 88.6 91.0  PLT 214 206  --   --  235 232 236   Cardiac Enzymes: No results for input(s): CKTOTAL, CKMB, CKMBINDEX, TROPONINI in the last 168 hours. BNP: Invalid input(s): POCBNP CBG: No results for input(s): GLUCAP in the last 168 hours. D-Dimer No results for input(s): DDIMER in the last 72 hours. Hgb A1c No results for input(s):  HGBA1C in the last 72 hours. Lipid Profile No results for input(s): CHOL, HDL, LDLCALC, TRIG, CHOLHDL, LDLDIRECT in the last 72 hours. Thyroid function studies No results for input(s): TSH, T4TOTAL, T3FREE, THYROIDAB in the last 72 hours.  Invalid input(s): FREET3 Anemia work up No results for input(s): VITAMINB12, FOLATE, FERRITIN, TIBC, IRON, RETICCTPCT in the last 72 hours. Microbiology Recent Results (from the past 240 hour(s))  Surgical pcr screen     Status: Abnormal   Collection Time: 11/21/21  5:52 AM   Specimen: Nasal Mucosa; Nasal Swab  Result Value Ref Range Status   MRSA, PCR NEGATIVE NEGATIVE Final   Staphylococcus aureus POSITIVE (A) NEGATIVE Final    Comment: (NOTE) The Xpert SA Assay (FDA approved for NASAL specimens in patients 78 years of age and older), is one component of a comprehensive surveillance program. It is not intended to diagnose infection nor to guide or monitor treatment. Performed at [facility], [place] [address] [place], [place] [address]     Time coordinating discharge: 35 minutes  Signed: Jaryd Drew  [facility] 11/29/2021, 12:50 PM

## 2021-11-30 ENCOUNTER — Encounter: Payer: Self-pay | Admitting: Internal Medicine

## 2021-11-30 LAB — TYPE AND SCREEN
ABO/RH(D): O POS
Antibody Screen: NEGATIVE
Unit division: 0
Unit division: 0

## 2021-11-30 LAB — BPAM RBC
Blood Product Expiration Date: 202306142359
Blood Product Expiration Date: 202306142359
ISSUE DATE / TIME: 202305190500
ISSUE DATE / TIME: 202305191058
Unit Type and Rh: 5100
Unit Type and Rh: 5100

## 2021-11-30 LAB — SURGICAL PATHOLOGY

## 2021-12-02 NOTE — Progress Notes (Signed)
Carelink Summary Report / Loop Recorder 

## 2021-12-07 ENCOUNTER — Ambulatory Visit (INDEPENDENT_AMBULATORY_CARE_PROVIDER_SITE_OTHER): Payer: Medicare Other | Admitting: Neurology

## 2021-12-07 ENCOUNTER — Encounter: Payer: Self-pay | Admitting: Neurology

## 2021-12-07 VITALS — BP 149/76 | HR 57 | Ht 60.0 in | Wt 111.0 lb

## 2021-12-07 DIAGNOSIS — I639 Cerebral infarction, unspecified: Secondary | ICD-10-CM

## 2021-12-07 DIAGNOSIS — I480 Paroxysmal atrial fibrillation: Secondary | ICD-10-CM | POA: Diagnosis not present

## 2021-12-07 DIAGNOSIS — Z8673 Personal history of transient ischemic attack (TIA), and cerebral infarction without residual deficits: Secondary | ICD-10-CM

## 2021-12-07 NOTE — Progress Notes (Signed)
[facility] [address] [place]. [place] [address] [phone]       OFFICE FOLLOW-UP NOTE  Ms. Quianna Toy Care Date of Birth:  [birth year] Medical Record Number:  [id number]   HPI: Initial visit 09/01/2021 Ms. Zhen is a 76 year old pleasant [place] Asian [place] origin lady who is seen for first office follow-up visit following stroke.  She is accompanied by her daughter.  History is obtained from her and review of electronic medical records and I personally reviewed available imaging films in PACS.  She has past medical history of SLE, hypertension, hyperlipidemia, GERD, restless leg syndrome and osteoporosis.  She presented on 06/26/2021 with sudden onset of dizziness nausea and vomiting and lethargy.  She felt like she was walking like a drunk.  There is no accompanying diplopia headache focal weakness or numbness.  Neurological exam showed slow saccades on left lateral gaze, truncal ataxia but normal strength sensation and coordination.  CT scan was unremarkable.  CT angiogram showed 2 mm outpouching from right ICA terminus moderate calcified stenosis in the left M2 and moderate narrowing of the left P2.  2D echo showed ejection fraction of 60 to 65% with moderate left ventricular hypertrophy.  LDL cholesterol 73 mg percent hemoglobin A1c 5.9.  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  Stroke etiology was felt to be cryptogenic in recommended loop recorder and started on aspirin 81 and Plavix 75 for 3 weeks and inpatient rehab.  She however returned on 07/31/2021 with transient episode of right sided weakness and facial droop lasting 15 minutes.  CT head was negative for acute abnormality and CT angiogram showed stable appearance of previously seen 40% stenosis of proximal right ICA and left M2 calcified stenosis and left P2 stenosis.  MRI showed 1.5 cm acute infarct in the left parietal lobe and showed the  previously seen remote hemorrhagic left superior cerebellar infarct.  2D echo was unremarkable TEE was obtained which showed no evidence of marantic endocarditis or clot but did show small PFO.  She had loop recorder placed during this admission.  Anticardiolipin antibodies were also checked which were negative.  LDL cholesterol 77 mg percent hemoglobin A1c 5.9.  She had previously been on aspirin 81 mg and the dose was increased to 325 mg and Plavix 75 mg was continued for up planned duration of 3 months followed by aspirin alone given left M2 stenosis.  Patient has done well since discharge.  Her right-sided weakness has not returned.  She continues to have mild gait and balance difficulties but can ambulate fairly well using a cane and walker.  She has had no falls or injuries.  She does have some increased bruising since aspirin dose was increased.  She is still getting physical and occupational therapy which is now down to once a week or so.  She is able to get up and walk around and handle her own affairs and can be left alone for several hours.  She has no new complaints today.  Loop recorder interrogation so far has not shown paroxysmal A-fib Update 12/07/2021 : She returns for follow-up after last visit 3 months ago.  She is accompanied by her daughter who provides most of the history.  Patient was hospitalized from 5/11 to 11/30/2020 with fecal impaction and proctitis as well as renal failure and dehydration.  She had been diagnosed with paroxysmal A-fib on loop recorder on 11/09/2021 and had been on Eliquis which was  held due to anemia and renal failure.  She had lab work last week as well which still showed persistent serum creatinine of 2.78 and hematocrit of 26 requiring blood transfusion during the hospitalization and she has a follow-up appointment with nephrologist in a few weeks decide on further course of action.  She has had no recurrent stroke or TIA symptoms.  Blood pressure slightly elevated  today at 149/76 but better at home.  She is on Norvasc encourage.  She is tolerating Crestor well without muscle aches and pains.  She has not had any recurrent stroke or TIA symptoms.  He has no new neurological complaints.  She is ambulating with a walker with a cautious gait and has had no falls or injuries. ROS:   14 system review of systems is positive for balance difficulties, walking difficulties , renal failure all other systems negative  PMH:  Past Medical History:  Diagnosis Date   AKI (acute kidney injury) ([place]) 11/18/2021   Arthritis    Atrial fibrillation ([place])    Depression    Essential hypertension 06/28/2021   Mixed hyperlipidemia 06/28/2021   Restless leg syndrome 06/28/2021   Stroke [facility])    x2    Social History:  Social History   Socioeconomic History   Marital status: Married    Spouse name: Not on file   Number of children: Not on file   Years of education: Not on file   Highest education level: Not on file  Occupational History   Not on file  Tobacco Use   Smoking status: Never   Smokeless tobacco: Never  Vaping Use   Vaping Use: Never used  Substance and Sexual Activity   Alcohol use: No   Drug use: No   Sexual activity: Not on file  Other Topics Concern   Not on file  Social History Narrative   Not on file   Social Determinants of Health   Financial Resource Strain: Not on file  Food Insecurity: Not on file  Transportation Needs: Not on file  Physical Activity: Not on file  Stress: Not on file  Social Connections: Not on file  Intimate Partner Violence: Not on file    Medications:   Current Outpatient Medications on File Prior to Visit  Medication Sig Dispense Refill   amLODipine (NORVASC) 5 MG tablet Take 1 tablet (5 mg total) by mouth daily. 30 tablet 2   carvedilol (COREG) 3.125 MG tablet Take 1 tablet (3.125 mg total) by mouth 2 (two) times daily with a meal. 60 tablet 2   clotrimazole-betamethasone (LOTRISONE) cream Apply 1  application. topically as needed (rash).     famotidine (PEPCID) 20 MG tablet Take 20 mg by mouth daily.     fluconazole (DIFLUCAN) 100 MG tablet Take 1 tablet (100 mg total) by mouth daily for 10 days. 10 tablet 0   hydrALAZINE (APRESOLINE) 50 MG tablet Take 50 mg by mouth in the morning and at bedtime.     hydroxychloroquine (PLAQUENIL) 200 MG tablet Take 200 mg by mouth daily.     isosorbide mononitrate (IMDUR) 60 MG 24 hr tablet Take 1 tablet (60 mg total) by mouth daily. 30 tablet 2   magnesium oxide (MAG-OX) 400 MG tablet Take 400 mg by mouth every evening.     pantoprazole (PROTONIX) 40 MG tablet Take 1 tablet (40 mg total) by mouth 2 (two) times daily. 60 tablet 1   polyethylene glycol (MIRALAX / GLYCOLAX) 17 g packet Take 17 g by mouth  daily as needed for mild constipation. 14 each 0   rOPINIRole (REQUIP) 1 MG tablet Take 1 mg by mouth at bedtime.     rosuvastatin (CRESTOR) 40 MG tablet Take 1 tablet (40 mg total) by mouth daily. 30 tablet 0   senna-docusate (SENOKOT-S) 8.6-50 MG tablet Take 1 tablet by mouth every morning.     senna-docusate (SENOKOT-S) 8.6-50 MG tablet Take 2 tablets by mouth at bedtime. 60 tablet 2   sodium bicarbonate 650 MG tablet Take 1 tablet (650 mg total) by mouth daily. 30 tablet 2   Sodium Phosphates (FLEET ENEMA RE) Place 1 enema rectally daily as needed (constipation).     traMADol (ULTRAM) 50 MG tablet Take 25 mg by mouth at bedtime as needed for moderate pain.     Vitamin D, Ergocalciferol, (DRISDOL) 1.25 MG (50000 UNIT) CAPS capsule Take 50,000 Units by mouth every Thursday.     No current facility-administered medications on file prior to visit.    Allergies:   Allergies  Allergen Reactions   Beef-Derived Products Other (See Comments)    Vegetarian  Does not eat due to religious reasons   Beta Adrenergic Blockers Other (See Comments)    Bradycardia   Bystolic [Nebivolol Hcl] Other (See Comments)    Bradycardia    Chicken Protein Other (See  Comments)    Vegetarian Does not eat due to religious reasons   Fish-Derived Products Other (See Comments)    Vegetarian  Does not eat due to religious reasons   Neurontin [Gabapentin] Other (See Comments)    Unknown reaction    Norvasc [Amlodipine] Swelling   Pork-Derived Products Other (See Comments)    Vegetarian (pt accepts heparin products 07/31/21) Does not eat due to religious reasons   Trandate [Labetalol] Other (See Comments)    Bradycardia   Zestril [Lisinopril] Other (See Comments)    Cough     Physical Exam General: Frail elderly [place] Asian lady seated, in no evident distress Head: head normocephalic and atraumatic.  Neck: supple with no carotid or supraclavicular bruits Cardiovascular: regular rate and rhythm, no murmurs Musculoskeletal: no deformity except mild kyphoscoliosis. Skin:  no rash/petichiae Vascular:  Normal pulses all extremities Vitals:   12/07/21 1048  BP: (!) 149/76  Pulse: (!) 57   Neurologic Exam Mental Status: Awake and fully alert. Oriented to place and time. Recent and remote memory intact. Attention span, concentration and fund of knowledge appropriate. Mood and affect appropriate.  Cranial Nerves: Fundoscopic exam reveals sharp disc margins. Pupils equal, briskly reactive to light. Extraocular movements full without nystagmus. Visual fields full to confrontation. Hearing intact. Facial sensation intact. Face, tongue, palate moves normally and symmetrically.  Motor: Normal bulk and tone. Normal strength in all tested extremity muscles. Sensory.: intact to touch ,pinprick .position and vibratory sensation.  Coordination: Rapid alternating movements normal in all extremities. Finger-to-nose and heel-to-shin performed accurately bilaterally. Gait and Station: Arises from chair without difficulty. Stance is slightly stooped.  Uses a cane.. Gait slow and cautious but steady.  Unable to heel, toe and tandem walk without difficulty.  Reflexes: 1+ and  symmetric. Toes downgoing.   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  Vascular risk factors of hypertension, hyperlipidemia and intracranial stenosis.  Recent diagnosis of paroxysmal A-fib on  loop recorder on 11/09/2021 and was started on Eliquis but it has been held due to recent acute renal failure and anemia requiring blood transfusion.     PLAN: I had a long d/w patient and her daughter about her remote stroke, recent diagnosis of paroxysmal A-fib on loop recorder recent hospitalization and renal failure and anemia, risk for recurrent stroke/TIAs, personally independently reviewed imaging studies and stroke evaluation results and answered questions.Continue to hold Eliquis for now follow-up atrial fibrillation given recent renal failure and anemia but may consider resuming later when her condition stabilizes. Maintain strict control of hypertension with blood pressure goal below 130/90, diabetes with hemoglobin A1c goal below 6.5% and lipids with LDL cholesterol goal below 70 mg/dL. I also advised the patient to eat a healthy diet with plenty of whole grains, cereals, fruits and vegetables, exercise regularly and maintain ideal body weight .continue follow-up with nephrology for her renal failure.  Followup in the future with me in 6 months or call earlier if necessary. Greater than 50% of time during this 40 minute visit was spent on counseling,explanation of diagnosis of paroxysmal atrial fibrillation , embolic stroke,, recent renal failure and anemia and risk benefit of anticoagulation and stroke risk and intracranial stenosis, planning of further management, discussion with patient and family and coordination of care Antony Contras, MD Note: This document was  prepared with digital dictation and possible smart phrase technology. Any transcriptional errors that result from this process are unintentional

## 2021-12-07 NOTE — Patient Instructions (Addendum)
I had a long d/w patient and her daughter about her remote stroke, recent diagnosis of paroxysmal A-fib on loop recorder recent hospitalization and renal failure and anemia, risk for recurrent stroke/TIAs, personally independently reviewed imaging studies and stroke evaluation results and answered questions.Continue to hold Eliquis for now follow-up atrial fibrillation given recent renal failure and anemia but may consider resuming later when her condition stabilizes. Maintain strict control of hypertension with blood pressure goal below 130/90, diabetes with hemoglobin A1c goal below 6.5% and lipids with LDL cholesterol goal below 70 mg/dL. I also advised the patient to eat a healthy diet with plenty of whole grains, cereals, fruits and vegetables, exercise regularly and maintain ideal body weight .continue follow-up with nephrology for her renal failure.  Followup in the future with me in 6 months or call earlier if necessary.

## 2021-12-13 ENCOUNTER — Encounter (HOSPITAL_COMMUNITY): Payer: Self-pay | Admitting: Physician Assistant

## 2021-12-13 ENCOUNTER — Ambulatory Visit (HOSPITAL_COMMUNITY)
Admission: RE | Admit: 2021-12-13 | Discharge: 2021-12-13 | Disposition: A | Discharge status: Home / Self Care | Payer: Medicare Other | Source: Ambulatory Visit | Attending: Nurse Practitioner | Admitting: Nurse Practitioner

## 2021-12-13 VITALS — BP 130/68 | HR 51 | Ht 60.0 in | Wt 106.6 lb

## 2021-12-13 DIAGNOSIS — D649 Anemia, unspecified: Secondary | ICD-10-CM | POA: Diagnosis not present

## 2021-12-13 DIAGNOSIS — Z7982 Long term (current) use of aspirin: Secondary | ICD-10-CM | POA: Diagnosis not present

## 2021-12-13 DIAGNOSIS — I480 Paroxysmal atrial fibrillation: Secondary | ICD-10-CM | POA: Diagnosis not present

## 2021-12-13 DIAGNOSIS — D6869 Other thrombophilia: Secondary | ICD-10-CM | POA: Insufficient documentation

## 2021-12-13 DIAGNOSIS — Z8673 Personal history of transient ischemic attack (TIA), and cerebral infarction without residual deficits: Secondary | ICD-10-CM | POA: Insufficient documentation

## 2021-12-13 DIAGNOSIS — Z7902 Long term (current) use of antithrombotics/antiplatelets: Secondary | ICD-10-CM | POA: Diagnosis not present

## 2021-12-13 LAB — CBC
HCT: 28.1 % — ABNORMAL LOW (ref 36.0–46.0)
Hemoglobin: 9 g/dL — ABNORMAL LOW (ref 12.0–15.0)
MCH: 30 pg (ref 26.0–34.0)
MCHC: 32 g/dL (ref 30.0–36.0)
MCV: 93.7 fL (ref 80.0–100.0)
Platelets: 304 10*3/uL (ref 150–400)
RBC: 3 MIL/uL — ABNORMAL LOW (ref 3.87–5.11)
RDW: 16 % — ABNORMAL HIGH (ref 11.5–15.5)
WBC: 3.8 10*3/uL — ABNORMAL LOW (ref 4.0–10.5)
nRBC: 0 % (ref 0.0–0.2)

## 2021-12-13 LAB — BASIC METABOLIC PANEL
Anion gap: 9 (ref 5–15)
BUN: 31 mg/dL — ABNORMAL HIGH (ref 8–23)
CO2: 23 mmol/L (ref 22–32)
Calcium: 9.2 mg/dL (ref 8.9–10.3)
Chloride: 100 mmol/L (ref 98–111)
Creatinine, Ser: 2.36 mg/dL — ABNORMAL HIGH (ref 0.44–1.00)
GFR, Estimated: 21 mL/min — ABNORMAL LOW (ref >60)
Glucose, Bld: 125 mg/dL — ABNORMAL HIGH (ref 70–99)
Potassium: 4.4 mmol/L (ref 3.5–5.1)
Sodium: 132 mmol/L — ABNORMAL LOW (ref 135–145)

## 2021-12-13 NOTE — Progress Notes (Signed)
Primary Care Physician: Joella Prince Referring Physician: Device clinic/Dr. Curt Bears     Lileigh Naramore is a 76 y.o. 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 She had a device implanted and it recently showed afib, longest episode of 28 mins, burden was 5.3%. Per Dr. Quentin Ore pt was to come here to discuss anticoagulation. She is currently on asa and plavix. Per Dr. Leonie Man, she can stop asa/plavix to get started on eliquis. She is here with her daughter, Jeannene Patella, who acts as her interpretor. Walks  with a walker but no recent falls. Has not had any bleeding issues with her current anticoagulation for stroke.   Follow up in the AF clinic 12/13/21. Patient was admitted 11/18/21 for a rectal impaction with stercoral proctitis. She had acute rectal bleeding and was seen by GI for flex sigmoidoscopy. Hgb was < 7 and she received two units of PRBC. Eliquis was held at discharge due to worsening anemia. Initially patient was suspected to have bleeding from stercoral ulcer but no bleeding was identified on sigmoidoscopy.  Patient underwent EGD and was found to have a large hiatal hernia, gastritis, as well as new liver lesion and esophagus mucosa suspicious of fungal infection.  GI started the patient on PPI and fluconazole.  Follow up in the AF clinic 12/13/21. History provided by daughter who serves as interpreter today. Patient reports that she feels well today. She is in SR today. ILR shows no new episodes of afib since leaving the hospital.   Today, she denies symptoms of palpitations, chest pain, shortness of breath, orthopnea, PND, lower extremity edema, dizziness, presyncope, syncope, or neurologic sequela. The patient is tolerating medications without difficulties and is otherwise without complaint today.   Past Medical History:  Diagnosis Date   AKI (acute kidney injury) ([place]) 11/18/2021   Arthritis     Atrial fibrillation ([place])    Depression    Essential hypertension 06/28/2021   Mixed hyperlipidemia 06/28/2021   Restless leg syndrome 06/28/2021   Stroke [facility])    x2   Past Surgical History:  Procedure Laterality Date   BIOPSY  11/27/2021   Procedure: BIOPSY;  Surgeon: Sharyn Creamer, MD;  Location: [place];  Service: Gastroenterology;;   Kathleen Argue STUDY  08/02/2021   Procedure: BUBBLE STUDY;  Surgeon: Jerline Pain, MD;  Location: [place] ENDOSCOPY;  Service: Cardiovascular;;   ESOPHAGOGASTRODUODENOSCOPY (EGD) WITH PROPOFOL N/A 11/27/2021   Procedure: ESOPHAGOGASTRODUODENOSCOPY (EGD) WITH PROPOFOL;  Surgeon: Sharyn Creamer, MD;  [facility] ENDOSCOPY;  Service: Gastroenterology;  Laterality: N/A;   FLEXIBLE SIGMOIDOSCOPY N/A 11/21/2021   Procedure: FLEXIBLE SIGMOIDOSCOPY;  Surgeon: [name], MD;  Location: [facility] ENDOSCOPY;  Service: Gastroenterology;  Laterality: N/A;   LOOP RECORDER INSERTION N/A 08/02/2021   Procedure: LOOP RECORDER INSERTION;  Surgeon: Vickie Epley, MD;  Location: [place] CV LAB;  Service: Cardiovascular;  Laterality: N/A;   TEE WITHOUT CARDIOVERSION N/A 08/02/2021   Procedure: TRANSESOPHAGEAL ECHOCARDIOGRAM (TEE);  Surgeon: Jerline Pain, MD;  Location: [facility] ENDOSCOPY;  Service: Cardiovascular;  Laterality: N/A;    Current Outpatient Medications  Medication Sig Dispense Refill   amLODipine (NORVASC) 5 MG tablet Take 1 tablet (5 mg total) by mouth daily. 30 tablet 2   carvedilol (COREG) 3.125 MG tablet Take 1 tablet (3.125 mg total) by mouth 2 (two) times daily with a meal. 60 tablet 2   clotrimazole-betamethasone (LOTRISONE) cream Apply  1 application. topically as needed (rash).     famotidine (PEPCID) 20 MG tablet Take 20 mg by mouth daily.     hydrALAZINE (APRESOLINE) 50 MG tablet Take 50 mg by mouth in the morning and at bedtime.     hydroxychloroquine (PLAQUENIL) 200 MG tablet Take 200 mg by mouth daily.     isosorbide mononitrate (IMDUR) 60 MG 24 hr  tablet Take 1 tablet (60 mg total) by mouth daily. 30 tablet 2   magnesium oxide (MAG-OX) 400 MG tablet Take 400 mg by mouth every evening.     pantoprazole (PROTONIX) 40 MG tablet Take 1 tablet (40 mg total) by mouth 2 (two) times daily. 60 tablet 1   polyethylene glycol (MIRALAX / GLYCOLAX) 17 g packet Take 17 g by mouth daily as needed for mild constipation. 14 each 0   rOPINIRole (REQUIP) 1 MG tablet Take 1 mg by mouth at bedtime.     rosuvastatin (CRESTOR) 40 MG tablet Take 1 tablet (40 mg total) by mouth daily. 30 tablet 0   senna-docusate (SENOKOT-S) 8.6-50 MG tablet Take 1 tablet by mouth every morning.     senna-docusate (SENOKOT-S) 8.6-50 MG tablet Take 2 tablets by mouth at bedtime. 60 tablet 2   sodium bicarbonate 650 MG tablet Take 1 tablet (650 mg total) by mouth daily. 30 tablet 2   Sodium Phosphates (FLEET ENEMA RE) Place 1 enema rectally daily as needed (constipation).     traMADol (ULTRAM) 50 MG tablet Take 25 mg by mouth at bedtime as needed for moderate pain.     Vitamin D, Ergocalciferol, (DRISDOL) 1.25 MG (50000 UNIT) CAPS capsule Take 50,000 Units by mouth every Thursday.     No current facility-administered medications for this encounter.    Allergies  Allergen Reactions   Beef-Derived Products Other (See Comments)    Vegetarian  Does not eat due to religious reasons   Beta Adrenergic Blockers Other (See Comments)    Bradycardia   Bystolic [Nebivolol Hcl] Other (See Comments)    Bradycardia    Chicken Protein Other (See Comments)    Vegetarian Does not eat due to religious reasons   Fish-Derived Products Other (See Comments)    Vegetarian  Does not eat due to religious reasons   Neurontin [Gabapentin] Other (See Comments)    Unknown reaction    Norvasc [Amlodipine] Swelling   Pork-Derived Products Other (See Comments)    Vegetarian (pt accepts heparin products 07/31/21) Does not eat due to religious reasons   Trandate [Labetalol] Other (See Comments)     Bradycardia   Zestril [Lisinopril] Other (See Comments)    Cough     Social History   Socioeconomic History   Marital status: Married    Spouse name: Not on file   Number of children: Not on file   Years of education: Not on file   Highest education level: Not on file  Occupational History   Not on file  Tobacco Use   Smoking status: Never   Smokeless tobacco: Never   Tobacco comments:    Never smoke 12/13/21  Vaping Use   Vaping Use: Never used  Substance and Sexual Activity   Alcohol use: No   Drug use: No   Sexual activity: Not on file  Other Topics Concern   Not on file  Social History Narrative   Not on file   Social Determinants of Health   Financial Resource Strain: Not on file  Food Insecurity: Not on file  Transportation  Needs: Not on file  Physical Activity: Not on file  Stress: Not on file  Social Connections: Not on file  Intimate Partner Violence: Not on file    Family History  Problem Relation Age of Onset   Heart disease Neg Hx     ROS- All systems are reviewed and negative except as per the HPI above  Physical Exam: Vitals:   12/13/21 1516  BP: 130/68  Pulse: (!) 51  Weight: 48.4 kg  Height: 5' (1.524 m)    Wt Readings from Last 3 Encounters:  12/13/21 48.4 kg  12/07/21 50.3 kg  11/29/21 55.2 kg    Labs: Lab Results  Component Value Date   NA 132 (L) 12/13/2021   K 4.4 12/13/2021   CL 100 12/13/2021   CO2 23 12/13/2021   GLUCOSE 125 (H) 12/13/2021   BUN 31 (H) 12/13/2021   CREATININE 2.36 (H) 12/13/2021   CALCIUM 9.2 12/13/2021   PHOS 3.5 11/29/2021   MG 2.1 11/24/2021   Lab Results  Component Value Date   INR 1.0 06/26/2021   Lab Results  Component Value Date   CHOL 142 07/31/2021   HDL 55 07/31/2021   LDLCALC 77 07/31/2021   TRIG 49 07/31/2021     GEN- The patient is a well appearing elderly female, alert and oriented x 3 today.   HEENT-head normocephalic, atraumatic, sclera clear, conjunctiva pink, hearing  intact, trachea midline. Lungs- Clear to ausculation bilaterally, normal work of breathing Heart- Regular rate and rhythm, no murmurs, rubs or gallops  GI- soft, NT, ND, + BS Extremities- no clubbing, cyanosis, or edema MS- no significant deformity or atrophy Skin- no rash or lesion Psych- euthymic mood, full affect Neuro- strength and sensation are intact   EKG- SB, T wave inv inferolateral Vent. rate 51 BPM PR interval 194 ms QRS duration 142 ms QT/QTcB 514/473 ms   Assessment and Plan:  1. Paroxysmal atrial fibrillation  Last ILR transmission showed 2.1% afib burden, no new episodes since discharge from hospital.  Patient in SR today.  Eliquis currently on hold with recent anemia requiring transfusion and AKI. Check bmet/cbc today. Hgb 9.0 today, will not resume Eliquis at this time. D/w daughter who voices understanding of stroke risk.  CHA2DS2VASc score of 6 Continue carvedilol 3.125 mg BID   Patient has follow up with her primary cardiologist (Dr Otho Perl) 6/16. Follow up in the AF clinic in 2 months.    [facility] [address] [place], [place] [address] [phone]   .

## 2021-12-20 ENCOUNTER — Ambulatory Visit (INDEPENDENT_AMBULATORY_CARE_PROVIDER_SITE_OTHER): Payer: Medicare Other

## 2021-12-20 DIAGNOSIS — I480 Paroxysmal atrial fibrillation: Secondary | ICD-10-CM | POA: Diagnosis not present

## 2021-12-21 ENCOUNTER — Ambulatory Visit (HOSPITAL_COMMUNITY): Payer: Medicare Other | Admitting: Nurse Practitioner

## 2021-12-21 LAB — CUP PACEART REMOTE DEVICE CHECK
Date Time Interrogation Session: 20230606180115
Implantable Pulse Generator Implant Date: 20230123

## 2022-01-06 NOTE — Progress Notes (Signed)
Carelink Summary Report / Loop Recorder 

## 2022-01-20 LAB — CUP PACEART REMOTE DEVICE CHECK
Date Time Interrogation Session: 20230709180348
Implantable Pulse Generator Implant Date: 20230123

## 2022-01-24 ENCOUNTER — Ambulatory Visit (INDEPENDENT_AMBULATORY_CARE_PROVIDER_SITE_OTHER): Payer: Medicare Other

## 2022-01-24 DIAGNOSIS — I63542 Cerebral infarction due to unspecified occlusion or stenosis of left cerebellar artery: Secondary | ICD-10-CM | POA: Diagnosis not present

## 2022-02-14 ENCOUNTER — Ambulatory Visit (HOSPITAL_COMMUNITY)
Admission: RE | Admit: 2022-02-14 | Discharge: 2022-02-14 | Disposition: A | Discharge status: Home / Self Care | Payer: Medicare Other | Source: Ambulatory Visit | Attending: Nurse Practitioner | Admitting: Nurse Practitioner

## 2022-02-14 ENCOUNTER — Encounter (HOSPITAL_COMMUNITY): Payer: Self-pay | Admitting: Nurse Practitioner

## 2022-02-14 VITALS — BP 144/70 | HR 46 | Ht 60.0 in | Wt 106.6 lb

## 2022-02-14 DIAGNOSIS — I482 Chronic atrial fibrillation, unspecified: Secondary | ICD-10-CM | POA: Diagnosis not present

## 2022-02-14 DIAGNOSIS — Z8673 Personal history of transient ischemic attack (TIA), and cerebral infarction without residual deficits: Secondary | ICD-10-CM | POA: Diagnosis not present

## 2022-02-14 DIAGNOSIS — D6869 Other thrombophilia: Secondary | ICD-10-CM | POA: Diagnosis not present

## 2022-02-14 DIAGNOSIS — I517 Cardiomegaly: Secondary | ICD-10-CM | POA: Insufficient documentation

## 2022-02-14 DIAGNOSIS — I480 Paroxysmal atrial fibrillation: Secondary | ICD-10-CM | POA: Diagnosis not present

## 2022-02-14 DIAGNOSIS — I493 Ventricular premature depolarization: Secondary | ICD-10-CM | POA: Diagnosis not present

## 2022-02-14 NOTE — Progress Notes (Signed)
Primary Care Physician: Barrington Ellison Referring Physician: Device clinic/Dr. Elberta Fortis   Cardiologist: Dr. Judithe Modest [facility])    Kim [name] is a 76 y.o. 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 She had a device implanted and it recently showed afib, longest episode of 28 mins, burden was 5.3%. Per Dr. Lalla Brothers pt was to come here to discuss anticoagulation. She is currently on asa and plavix. Per Dr. Pearlean Brownie, she can stop asa/plavix to get started on eliquis. She is here with her daughter, Kim [name], who acts as her interpretor. Walks  with a walker but no recent falls. Has not had any bleeding issues with her current anticoagulation for stroke.   Follow up in the AF clinic 12/13/21. Patient was admitted 11/18/21 for a rectal impaction with stercoral proctitis. She had acute rectal bleeding and was seen by GI for flex sigmoidoscopy. Hgb was < 7 and she received two units of PRBC. Eliquis was held at discharge due to worsening anemia. Initially patient was suspected to have bleeding from stercoral ulcer but no bleeding was identified on sigmoidoscopy.  Patient underwent EGD and was found to have a large hiatal hernia, gastritis, as well as new liver lesion and esophagus mucosa suspicious of fungal infection.  GI started the patient on PPI and fluconazole.  Follow up in the AF clinic 12/13/21. History provided by daughter who serves as interpreter today. Patient reports that she feels well today. She is in SR today. ILR shows no new episodes of afib since leaving the hospital.   F/u in afib clinic, 02/14/22. She remains off eliquis with a H/H of  8.9/27 as of 7/6/3. On last paceart, it was reported no arrhythmia. She remains off anticoagulation. She has ben referred to GI by her PCP that saw pt 01/13/22. No further overt GI bleeding. She also has a cardiologist in [place] that has referred her to Dr. Lalla Brothers to be considered  for Watchman. Appointment pending in September.Pt does have a slow heart rate but is not symptomatic with this. Her daughter is with her and acting  as interpretor.   Today, she denies symptoms of palpitations, chest pain, shortness of breath, orthopnea, PND, lower extremity edema, dizziness, presyncope, syncope, or neurologic sequela. The patient is tolerating medications without difficulties and is otherwise without complaint today.   Past Medical History:  Diagnosis Date   AKI (acute kidney injury) (HCC) 11/18/2021   Arthritis    Atrial fibrillation (HCC)    Depression    Essential hypertension 06/28/2021   Mixed hyperlipidemia 06/28/2021   Restless leg syndrome 06/28/2021   Stroke [facility])    x2   Past Surgical History:  Procedure Laterality Date   BIOPSY  11/27/2021   Procedure: BIOPSY;  Surgeon: [name], MD;  Location: [facility] ENDOSCOPY;  Service: Gastroenterology;;   Thressa Sheller STUDY  08/02/2021   Procedure: BUBBLE STUDY;  Surgeon: Jake Bathe, MD;  Location: [facility] ENDOSCOPY;  Service: Cardiovascular;;   ESOPHAGOGASTRODUODENOSCOPY (EGD) WITH PROPOFOL N/A 11/27/2021   Procedure: ESOPHAGOGASTRODUODENOSCOPY (EGD) WITH PROPOFOL;  Surgeon: [name], MD;  Location[facility] ENDOSCOPY;  Service: Gastroenterology;  Laterality: N/A;   FLEXIBLE SIGMOIDOSCOPY N/A 11/21/2021   Procedure: FLEXIBLE SIGMOIDOSCOPY;  Surgeon: Rachael Fee, MD;  Location: [facility] ENDOSCOPY;  Service: Gastroenterology;  Laterality: N/A;   LOOP RECORDER INSERTION N/A 08/02/2021   Procedure: LOOP RECORDER INSERTION;  Surgeon: Lanier Prude, MD;  Location: [facility] INVASIVE CV LAB;  Service: Cardiovascular;  Laterality: N/A;   TEE WITHOUT CARDIOVERSION N/A 08/02/2021   Procedure: TRANSESOPHAGEAL ECHOCARDIOGRAM (TEE);  Surgeon: Jake Bathe, MD;  Location: [facility] ENDOSCOPY;  Service: Cardiovascular;  Laterality: N/A;    Current Outpatient Medications  Medication Sig Dispense Refill   amLODipine (NORVASC) 5 MG tablet Take 1 tablet  (5 mg total) by mouth daily. 30 tablet 2   carvedilol (COREG) 3.125 MG tablet Take 1 tablet (3.125 mg total) by mouth 2 (two) times daily with a meal. 60 tablet 2   clotrimazole-betamethasone (LOTRISONE) cream Apply 1 application. topically as needed (rash).     famotidine (PEPCID) 20 MG tablet Take 20 mg by mouth daily.     hydrALAZINE (APRESOLINE) 50 MG tablet Take 50 mg by mouth in the morning and at bedtime.     hydroxychloroquine (PLAQUENIL) 200 MG tablet Take 200 mg by mouth daily.     isosorbide mononitrate (IMDUR) 60 MG 24 hr tablet Take 1 tablet (60 mg total) by mouth daily. 30 tablet 2   magnesium oxide (MAG-OX) 400 MG tablet Take 400 mg by mouth every evening.     pantoprazole (PROTONIX) 40 MG tablet Take 1 tablet (40 mg total) by mouth 2 (two) times daily. 60 tablet 1   polyethylene glycol (MIRALAX / GLYCOLAX) 17 g packet Take 17 g by mouth daily as needed for mild constipation. 14 each 0   rOPINIRole (REQUIP) 1 MG tablet Take 1 mg by mouth at bedtime.     rosuvastatin (CRESTOR) 40 MG tablet Take 1 tablet (40 mg total) by mouth daily. 30 tablet 0   senna-docusate (SENOKOT-S) 8.6-50 MG tablet Take 1 tablet by mouth every morning.     senna-docusate (SENOKOT-S) 8.6-50 MG tablet Take 2 tablets by mouth at bedtime. 60 tablet 2   sodium bicarbonate 650 MG tablet Take 1 tablet (650 mg total) by mouth daily. 30 tablet 2   Sodium Phosphates (FLEET ENEMA RE) Place 1 enema rectally daily as needed (constipation).     traMADol (ULTRAM) 50 MG tablet Take 25 mg by mouth at bedtime as needed for moderate pain.     Vitamin D, Ergocalciferol, (DRISDOL) 1.25 MG (50000 UNIT) CAPS capsule Take 50,000 Units by mouth every Thursday.     No current facility-administered medications for this encounter.    Allergies  Allergen Reactions   Beef-Derived Products Other (See Comments)    Vegetarian  Does not eat due to religious reasons   Beta Adrenergic Blockers Other (See Comments)    Bradycardia    Bystolic [Nebivolol Hcl] Other (See Comments)    Bradycardia    Chicken Protein Other (See Comments)    Vegetarian Does not eat due to religious reasons   Fish-Derived Products Other (See Comments)    Vegetarian  Does not eat due to religious reasons   Neurontin [Gabapentin] Other (See Comments)    Unknown reaction    Norvasc [Amlodipine] Swelling   Other Other (See Comments)    Vegetarian Does not eat due to religious reasons   Pork-Derived Products Other (See Comments)    Vegetarian (pt accepts heparin products 07/31/21) Does not eat due to religious reasons   Trandate [Labetalol] Other (See Comments)    Bradycardia   Zestril [Lisinopril] Other (See Comments)    Cough     Social History   Socioeconomic History   Marital status: Married    Spouse name: Not on file   Number of children: Not on file  Years of education: Not on file   Highest education level: Not on file  Occupational History   Not on file  Tobacco Use   Smoking status: Never   Smokeless tobacco: Never   Tobacco comments:    Never smoke 12/13/21  Vaping Use   Vaping Use: Never used  Substance and Sexual Activity   Alcohol use: No   Drug use: No   Sexual activity: Not on file  Other Topics Concern   Not on file  Social History Narrative   Not on file   Social Determinants of Health   Financial Resource Strain: Not on file  Food Insecurity: Not on file  Transportation Needs: Not on file  Physical Activity: Not on file  Stress: Not on file  Social Connections: Not on file  Intimate Partner Violence: Not on file    Family History  Problem Relation Age of Onset   Heart disease Neg Hx     ROS- All systems are reviewed and negative except as per the HPI above  Physical Exam: Vitals:   02/14/22 1529  Weight: 48.4 kg  Height: 5' (1.524 m)    Wt Readings from Last 3 Encounters:  02/14/22 48.4 kg  12/13/21 48.4 kg  12/07/21 50.3 kg    Labs: Lab Results  Component Value Date   NA  132 (L) 12/13/2021   K 4.4 12/13/2021   CL 100 12/13/2021   CO2 23 12/13/2021   GLUCOSE 125 (H) 12/13/2021   BUN 31 (H) 12/13/2021   CREATININE 2.36 (H) 12/13/2021   CALCIUM 9.2 12/13/2021   PHOS 3.5 11/29/2021   MG 2.1 11/24/2021   Lab Results  Component Value Date   INR 1.0 06/26/2021   Lab Results  Component Value Date   CHOL 142 07/31/2021   HDL 55 07/31/2021   LDLCALC 77 07/31/2021   TRIG 49 07/31/2021     GEN- The patient is a well appearing elderly female, alert and oriented x 3 today.   HEENT-head normocephalic, atraumatic, sclera clear, conjunctiva pink, hearing intact, trachea midline. Lungs- Clear to ausculation bilaterally, normal work of breathing Heart- Regular rate and rhythm, no murmurs, rubs or gallops  GI- soft, NT, ND, + BS Extremities- no clubbing, cyanosis, or edema MS- no significant deformity or atrophy Skin- no rash or lesion Psych- euthymic mood, full affect Neuro- strength and sensation are intact   EKG-Vent. rate 46 BPM PR interval 180 ms QRS duration 144 ms QT/QTcB 476/416 ms P-R-T axes 4 -66 87 Sinus bradycardia with sinus arrhythmia with occasional Premature ventricular complexes Left axis deviation Left ventricular hypertrophy with QRS widening ( R in aVL , Cornell product , Romhilt-Estes ) T wave abnormality, consider lateral ischemia Abnormal ECG When compared with ECG of 13-Dec-2021 15:19, PREVIOUS ECG IS PRESENT     Assessment and Plan:  1. Paroxysmal atrial fibrillation  Last ILR transmission showed  no episodes of afib   Patient in Sinus brady today.  Eliquis currently on hold with recent anemia requiring transfusion. Will not resume Eliquis at this time. AShe is pending eval for Watchman with Dr. Lalla Brothers 03/23/22 [id number] score of 6 Continue carvedilol 3.125 mg BID      Kim Leash C. Matthew Folks [facility] [address] [place], [place] [address] [phone]   .

## 2022-02-24 NOTE — Progress Notes (Signed)
Carelink Summary Report / Loop Recorder 

## 2022-02-28 ENCOUNTER — Ambulatory Visit (INDEPENDENT_AMBULATORY_CARE_PROVIDER_SITE_OTHER): Payer: Medicare Other

## 2022-02-28 DIAGNOSIS — I482 Chronic atrial fibrillation, unspecified: Secondary | ICD-10-CM | POA: Diagnosis not present

## 2022-03-02 LAB — CUP PACEART REMOTE DEVICE CHECK
Date Time Interrogation Session: 20230823081305
Implantable Pulse Generator Implant Date: 20230123

## 2022-03-23 ENCOUNTER — Ambulatory Visit: Payer: Medicare Other | Attending: Cardiology | Admitting: Cardiology

## 2022-03-23 ENCOUNTER — Encounter: Payer: Self-pay | Admitting: Cardiology

## 2022-03-23 VITALS — BP 124/76 | HR 96 | Ht 60.0 in | Wt 106.6 lb

## 2022-03-23 DIAGNOSIS — I480 Paroxysmal atrial fibrillation: Secondary | ICD-10-CM | POA: Insufficient documentation

## 2022-03-23 DIAGNOSIS — I63542 Cerebral infarction due to unspecified occlusion or stenosis of left cerebellar artery: Secondary | ICD-10-CM | POA: Diagnosis present

## 2022-03-23 NOTE — Progress Notes (Signed)
Electrophysiology Office Note:    Date:  03/23/2022   ID:  Kim [name], DOB [birth year], MRN [id number]  PCP:  Curt Jews, PA-C  [facility] Cardiologist:  None  [facility] Electrophysiologist:  Vickie Epley, MD   Referring MD: Curt Jews, PA-C   Chief Complaint: Watchman consult  History of Present Illness:    Kim [name] is a 76 y.o. female who presents for an evaluation for watchman consult. 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 She has also been followed by [facility] and V[facility], last seen by them 12/23/2021.  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 She was given aspirin 325 mg, Plavix 300 mg and admitted for stroke work-up. Admitted to hospitalist service on 12/18. MRI brain showed an acute left superior cerebellar artery territory infarct with associated hemosiderin deposition, likely petechial hemorrhage and mild edema causing mild local mass-effect. Echo showed LVEF 60-65%, moderate LVH, and grade 1 diastolic dysfunction. She was discharged aspirin and Plavix for 3 weeks, followed by aspirin alone. It was recommended she have a loop recorder placed, but this was refused by the family.  On 07/30/2021 she was readmitted with transient right sided weakness and facial droop for about 15 minutes. Per hospital notes, "MRI showed an acute left parietal lobe infarct. No atrial fibrillation was detected on cardiac monitoring and echocardiogram revealed no interatrial shunt or cardioembolic source. TEE and loop recorder implantation was pursued prior to discharge. Neurology had recommended increasing aspirin from 81mg  to 325mg  and continuing plavix and statin in hopes of  secondary stroke risk reduction." Follow-up with EP arranged on discharge. Aspirin 325 mg and Plavix would be continued for 3 months, then transitioned to Aspirin alone given her left M2 stenosis on CTA. Crestor was increased from 20 mg to 40 mg.  On 11/08/2021 LINQ alert received. There were 2 new AF events, longest duration 4min, mean HR 125. Burden 5.3%, no OAC, DAPT. Reviewed with Dr. Curt Bears and agreed AF. Recommended AF clinic for possible [facility] therapy start.   She most recently saw Roderic Palau, NP in the Afib clinic on 02/14/2022. At that visit, she was bradycardic but not symptomatic. She remained off eliquis with a H/H of  8.9/27 as of 7/6/3. On last paceart, no arrhythmias were reported. She had been referred to GI by her PCP that saw pt 01/13/22. No further overt GI bleeding. She also has a cardiologist in [place] that referred her to EP to be considered for Watchman. Eliquis still held due to recent anemia requiring transfusion. CHA2DS2VASc score of 6.   Today, she is accompanied by her daughter-in-law who assists with translation and interpretation.    Generally she is not able to tell when she is in atrial fibrillation; she is asymptomatic.   For activity she will walk around her house. She was also able to walk to this office from the parking lot.  She denies any palpitations, chest pain, shortness of breath, or peripheral edema. No lightheadedness, headaches, syncope, orthopnea, or PND.     Past Medical History:  Diagnosis Date   AKI (acute kidney injury) ([place]) 11/18/2021   Arthritis    Atrial fibrillation ([place])    Depression    Essential hypertension 06/28/2021   Mixed hyperlipidemia  06/28/2021   Restless leg syndrome 06/28/2021   Stroke [facility])    x2    Past Surgical History:  Procedure Laterality Date   BIOPSY  11/27/2021   Procedure: BIOPSY;  Surgeon: [name], MD;  Location: [facility] ENDOSCOPY;  Service: Gastroenterology;;   Thressa Sheller STUDY  08/02/2021   Procedure: BUBBLE STUDY;   Surgeon: Jake Bathe, MD;  Location: [facility] ENDOSCOPY;  Service: Cardiovascular;;   ESOPHAGOGASTRODUODENOSCOPY (EGD) WITH PROPOFOL N/A 11/27/2021   Procedure: ESOPHAGOGASTRODUODENOSCOPY (EGD) WITH PROPOFOL;  Surgeon: [name], MD;  Location: [facility] ENDOSCOPY;  Service: Gastroenterology;  Laterality: N/A;   FLEXIBLE SIGMOIDOSCOPY N/A 11/21/2021   Procedure: FLEXIBLE SIGMOIDOSCOPY;  Surgeon: Rachael Fee, MD;  Location: [facility] ENDOSCOPY;  Service: Gastroenterology;  Laterality: N/A;   LOOP RECORDER INSERTION N/A 08/02/2021   Procedure: LOOP RECORDER INSERTION;  Surgeon: Lanier Prude, MD;  Location: [facility] INVASIVE CV LAB;  Service: Cardiovascular;  Laterality: N/A;   TEE WITHOUT CARDIOVERSION N/A 08/02/2021   Procedure: TRANSESOPHAGEAL ECHOCARDIOGRAM (TEE);  Surgeon: Jake Bathe, MD;  Location: [facility] ENDOSCOPY;  Service: Cardiovascular;  Laterality: N/A;    Current Medications: No outpatient medications have been marked as taking for the 03/23/22 encounter (Office Visit) with Lanier Prude, MD.     Allergies:   Beef-derived products, Beta adrenergic blockers, Bystolic [nebivolol hcl], Chicken protein, Fish-derived products, Neurontin [gabapentin], Norvasc [amlodipine], Other, Pork-derived products, Trandate [labetalol], and Zestril [lisinopril]   Social History   Socioeconomic History   Marital status: Married    Spouse name: Not on file   Number of children: Not on file   Years of education: Not on file   Highest education level: Not on file  Occupational History   Not on file  Tobacco Use   Smoking status: Never   Smokeless tobacco: Never   Tobacco comments:    Never smoke 12/13/21  Vaping Use   Vaping Use: Never used  Substance and Sexual Activity   Alcohol use: No   Drug use: No   Sexual activity: Not on file  Other Topics Concern   Not on file  Social History Narrative   Not on file   Social Determinants of Health   Financial Resource Strain: Not on file  Food  Insecurity: Not on file  Transportation Needs: Not on file  Physical Activity: Not on file  Stress: Not on file  Social Connections: Not on file     Family History: The patient's family history is negative for Heart disease.  ROS:   Please see the history of present illness.     All other systems reviewed and are negative.  EKGs/Labs/Other Studies Reviewed:    The following studies were reviewed today:  Loop interrogation reviewed 1.8% AF burden    08/02/2021  Loop Recorder Insertion: CONCLUSIONS:   1. Successful implantation of a [employer] Reveal LINQ implantable loop recorder for a history of cryptogenic stroke  2. No early apparent complications.   08/02/2021  TEE:  1. PFO present.   2. Left ventricular ejection fraction, by estimation, is 60 to 65%. The left ventricle has normal function. The left ventricle has no regional wall motion abnormalities.   3. Right ventricular systolic function is normal. The right ventricular  size is normal.   4. No left atrial/left atrial appendage thrombus was detected.   5. The mitral valve is normal in structure. Mild mitral valve  regurgitation. No evidence of mitral stenosis.   6. The aortic valve is tricuspid. There is mild  calcification of the  aortic valve. There is mild thickening of the aortic valve. Aortic valve regurgitation is mild. Aortic valve sclerosis is present, with no evidence of aortic valve stenosis.   7. Aortic dilatation noted. There is borderline dilatation of the  ascending aorta, measuring 39 mm.   8. The inferior vena cava is normal in size with greater than 50%  respiratory variability, suggesting right atrial pressure of 3 mmHg.   9. Agitated saline contrast bubble study was positive with shunting  observed within 3-6 cardiac cycles suggestive of interatrial shunt. There is a small patent foramen ovale with bidirectional shunting across atrial septum.   07/30/2021  CTA head/neck: IMPRESSION: 1. Stable CTA  of the head and neck. No large vessel occlusion. 2. 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 3. Moderate calcified stenosis involving a proximal left M2 branch, with short-segment moderate left P2 stenoses, stable. 4. Diffuse tortuosity of the major arterial vasculature of the neck, suggesting chronic underlying hypertension. 5. 2 mm outpouching arising from the supraclinoid right ICA has an appearance most consistent with a vascular infundibulum on today's exam. No intracranial aneurysm.   EKG:   EKG is personally reviewed.  03/23/2022:  EKG was not ordered.  Recent Labs: 11/19/2021: ALT 12; TSH 2.465 11/24/2021: Magnesium 2.1 12/13/2021: BUN 31; Creatinine, Ser 2.36; Hemoglobin 9.0; Platelets 304; Potassium 4.4; Sodium 132   Recent Lipid Panel    Component Value Date/Time   CHOL 142 07/31/2021 0152   TRIG 49 07/31/2021 0152   HDL 55 07/31/2021 0152   CHOLHDL 2.6 07/31/2021 0152   VLDL 10 07/31/2021 0152   LDLCALC 77 07/31/2021 0152    Physical Exam:    VS:  BP 124/76   Pulse 96   Ht 5' (1.524 m)   Wt 106 lb 9.6 oz (48.4 kg)   SpO2 98%   BMI 20.82 kg/m     Wt Readings from Last 3 Encounters:  03/23/22 106 lb 9.6 oz (48.4 kg)  02/14/22 106 lb 9.6 oz (48.4 kg)  12/13/21 106 lb 9.6 oz (48.4 kg)     GEN: Elderly. Using rolator. In no distress. HEENT: Normal NECK: No JVD; No carotid bruits LYMPHATICS: No lymphadenopathy CARDIAC: RRR, no murmurs, rubs, gallops.  RESPIRATORY:  Clear to auscultation without rales, wheezing or rhonchi  ABDOMEN: Soft, non-tender, non-distended MUSCULOSKELETAL:  No edema; No deformity  SKIN: Warm and dry NEUROLOGIC:  Alert and oriented x 3 PSYCHIATRIC:  Normal affect       ASSESSMENT:    1. Paroxysmal atrial fibrillation (HCC)   2. 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 She has not tolerated  anticoagulation due to severe symptomatic anemia. She is being evaluated for Watchman evaluation.   I would like to start by getting a TEE to better assess the LAA suitability for device implant. I will plan to see her back after TEE to review results. At that appointment, plan to call in her daughter who is a physician in [place]. Also will plan for interpreter to discuss details of procedures, etc.  If we proceed with implant, plan for ASA and Plavix post implant.  ------------  I have seen Liboria Toy Care in the office today who is being considered for a Watchman left atrial appendage closure device. I believe they will benefit from this procedure given their history of atrial fibrillation, CHA2DS2-VASc  score of 6 and unadjusted ischemic stroke rate of 9.7% per year. Unfortunately, the patient is not felt to be a long term anticoagulation candidate secondary to anemia/GI bleeding. The patient's chart has been reviewed and I feel that they would be a candidate for short term oral anticoagulation after Watchman implant.   It is my belief that after undergoing a LAA closure procedure, Tayla Zeien will not need long term anticoagulation which eliminates anticoagulation side effects and major bleeding risk.   Procedural risks for the Watchman implant have been reviewed with the patient including a 0.5% risk of stroke, <1% risk of perforation and <1% risk of device embolization. Other risks include bleeding, vascular damage, tamponade, worsening renal function, and death.    The published clinical data on the safety and effectiveness of WATCHMAN include but are not limited to the following: - Holmes DR, Mechele Claude, Sick P et al. for the PROTECT AF Investigators. Percutaneous closure of the left atrial appendage versus warfarin therapy for prevention of stroke in patients with atrial fibrillation: a randomised non-inferiority trial. Lancet 2009; 374: 534-42. Mechele Claude, Doshi SK, Abelardo Diesel D et al. on  behalf of the PROTECT AF Investigators. Percutaneous Left Atrial Appendage Closure for Stroke Prophylaxis in Patients With Atrial Fibrillation 2.3-Year Follow-up of the PROTECT AF (Watchman Left Atrial Appendage System for Embolic Protection in Patients With Atrial Fibrillation) Trial. Circulation 2013; 127:720-729. - Alli O, Doshi S,  Kar S, Reddy VY, Sievert H et al. Quality of Life Assessment in the Randomized PROTECT AF (Percutaneous Closure of the Left Atrial Appendage Versus Warfarin Therapy for Prevention of Stroke in Patients With Atrial Fibrillation) Trial of Patients at Risk for Stroke With Nonvalvular Atrial Fibrillation. J Am Coll Cardiol 2013; P4788364. Vertell Limber DR, Tarri Abernethy, Price M, [place], Sievert H, Doshi S, Huber K, Reddy V. Prospective randomized evaluation of the Watchman left atrial appendage Device in patients with atrial fibrillation versus long-term warfarin therapy; the PREVAIL trial. Journal of the [employer] of Cardiology, Vol. 4, No. 1, 2014, 1-11. - Kar S, Doshi SK, Sadhu A, Horton R, Osorio J et al. Primary outcome evaluation of a next-generation left atrial appendage closure device: results from the PINNACLE FLX trial. Circulation 2021;143(18)1754-1762.   Prior to the procedure, I would like to obtain a TEE.  HAS-BLED score 5 Hypertension Yes  Abnormal renal and liver function (Dialysis, transplant, Cr >2.26 mg/dL /Cirrhosis or Bilirubin >2x Normal or AST/ALT/AP >3x Normal) Yes  Stroke Yes  Bleeding Yes  Labile INR (Unstable/high INR) No  Elderly (>65) Yes  Drugs or alcohol (? 8 drinks/week, anti-plt or NSAID) No   CHA2DS2-VASc Score = 6  The patient's score is based upon: CHF History: 0 HTN History: 1 Diabetes History: 0 Stroke History: 2 Vascular Disease History: 0 Age Score: 2 Gender Score: 1  Her daughter is a PCP Manpreet Budhan [phone]  Total time spent with patient today 60 minutes. This includes reviewing records, evaluating the  patient and coordinating care.  Medication Adjustments/Labs and Tests Ordered: Current medicines are reviewed at length with the patient today.  Concerns regarding medicines are outlined above.   No orders of the defined types were placed in this encounter.  No orders of the defined types were placed in this encounter.  I,Mathew Stumpf,acting as a Education administrator for Vickie Epley, MD.,have documented all relevant documentation on the behalf of Vickie Epley, MD,as directed by  Vickie Epley, MD while in the presence of Magaline Steinberg  Chalmers Cater, MD.  I, Lanier Prude, MD, have reviewed all documentation for this visit. The documentation on 03/23/22 for the exam, diagnosis, procedures, and orders are all accurate and complete.   Signed, Rossie Muskrat. Lalla Brothers, MD, [facility], [facility] 03/23/2022 10:24 PM    Electrophysiology [facility]

## 2022-03-23 NOTE — Patient Instructions (Signed)
Medication Instructions:  none *If you need a refill on your cardiac medications before your next appointment, please call your pharmacy*   Lab Work: none If you have labs (blood work) drawn today and your tests are completely normal, you will receive your results only by: MyChart Message (if you have MyChart) OR A paper copy in the mail If you have any lab test that is abnormal or we need to change your treatment, we will call you to review the results.   Testing/Procedures: Your physician has requested that you have a TEE. During a TEE, sound waves are used to create images of your heart. It provides your doctor with information about the size and shape of your heart and how well your heart's chambers and valves are working. In this test, a transducer is attached to the end of a flexible tube that's guided down your throat and into your esophagus (the tube leading from you mouth to your stomach) to get a more detailed image of your heart. You are not awake for the procedure. Please see the instruction sheet given to you today. For further information please visit [email or web address]     Follow-Up: At [facility], you and your health needs are our priority.  As part of our continuing mission to provide you with exceptional heart care, we have created designated Provider Care Teams.  These Care Teams include your primary Cardiologist (physician) and Advanced Practice Providers (APPs -  Physician Assistants and Nurse Practitioners) who all work together to provide you with the care you need, when you need it.  We recommend signing up for the patient portal called "MyChart".  Sign up information is provided on this After Visit Summary.  MyChart is used to connect with patients for Virtual Visits (Telemedicine).  Patients are able to view lab/test results, encounter notes, upcoming appointments, etc.  Non-urgent messages can be sent to your provider as well.   To learn more about what  you can do with MyChart, go to [email or web address].    Your next appointment:   6 week(s) with a [place] interpreter  The format for your next appointment:   In Person  Provider:   Steffanie Dunn, MD    Other Instructions  TEE You are scheduled for a TEE on Sept 26 with Dr. Jens Som.  Please arrive at the [facility] (Main Entrance A) at [facility]: [address] [place], [place] [address] at 8:30 am.   DIET: Nothing to eat or drink after midnight except a sip of water with medications (see medication instructions below)  FYI: For your safety, and to allow [place] to monitor your vital signs accurately during the surgery/procedure we request that   if you have artificial nails, gel coating, SNS etc. Please have those removed prior to your surgery/procedure. Not having the nail coverings /polish removed may result in cancellation or delay of your surgery/procedure.   Medication Instructions: Morning medications okay.   You must have a responsible person to drive you home and stay in the waiting area during your procedure. Failure to do so could result in cancellation.  Bring your insurance cards.  *Special Note: Every effort is made to have your procedure done on time. Occasionally there are emergencies that occur at the hospital that may cause delays. Please be patient if a delay does occur.    Important Information About Sugar

## 2022-03-23 NOTE — H&P (View-Only) (Signed)
Electrophysiology Office Note:    Date:  03/23/2022   ID:  Kim [name], DOB 12/11/1945, MRN [id number]  PCP:  Curt Jews, PA-C  [facility] Cardiologist:  None  [facility] Electrophysiologist:  Vickie Epley, MD   Referring MD: Curt Jews, PA-C   Chief Complaint: Watchman consult  History of Present Illness:    Kim [name] is a 76 y.o. female who presents for an evaluation for watchman consult. 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 She has also been followed by [facility], last seen by them 12/23/2021.  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 She was given aspirin 325 mg, Plavix 300 mg and admitted for stroke work-up. Admitted to hospitalist service on 12/18. MRI brain showed an acute left superior cerebellar artery territory infarct with associated hemosiderin deposition, likely petechial hemorrhage and mild edema causing mild local mass-effect. Echo showed LVEF 60-65%, moderate LVH, and grade 1 diastolic dysfunction. She was discharged aspirin and Plavix for 3 weeks, followed by aspirin alone. It was recommended she have a loop recorder placed, but this was refused by the family.  On 07/30/2021 she was readmitted with transient right sided weakness and facial droop for about 15 minutes. Per hospital notes, "MRI showed an acute left parietal lobe infarct. No atrial fibrillation was detected on cardiac monitoring and echocardiogram revealed no interatrial shunt or cardioembolic source. TEE and loop recorder implantation was pursued prior to discharge. Neurology had recommended increasing aspirin from 81mg  to 325mg  and continuing plavix and statin in hopes of  secondary stroke risk reduction." Follow-up with EP arranged on discharge. Aspirin 325 mg and Plavix would be continued for 3 months, then transitioned to Aspirin alone given her left M2 stenosis on CTA. Crestor was increased from 20 mg to 40 mg.  On 11/08/2021 LINQ alert received. There were 2 new AF events, longest duration 23min, mean HR 125. Burden 5.3%, no OAC, DAPT. Reviewed with Dr. Curt Bears and agreed AF. Recommended AF clinic for possible [facility] therapy start.   She most recently saw Roderic Palau, NP in the Afib clinic on 02/14/2022. At that visit, she was bradycardic but not symptomatic. She remained off eliquis with a H/H of  8.9/27 as of 7/6/3. On last paceart, no arrhythmias were reported. She had been referred to GI by her PCP that saw pt 01/13/22. No further overt GI bleeding. She also has a cardiologist in [place] that referred her to EP to be considered for Watchman. Eliquis still held due to recent anemia requiring transfusion. CHA2DS2VASc score of 6.   Today, she is accompanied by her daughter-in-law who assists with translation and interpretation.    Generally she is not able to tell when she is in atrial fibrillation; she is asymptomatic.   For activity she will walk around her house. She was also able to walk to this office from the parking lot.  She denies any palpitations, chest pain, shortness of breath, or peripheral edema. No lightheadedness, headaches, syncope, orthopnea, or PND.     Past Medical History:  Diagnosis Date   AKI (acute kidney injury) ([place]) 11/18/2021   Arthritis    Atrial fibrillation ([place])    Depression    Essential hypertension 06/28/2021   Mixed hyperlipidemia  06/28/2021   Restless leg syndrome 06/28/2021   Stroke [facility])    x2    Past Surgical History:  Procedure Laterality Date   BIOPSY  11/27/2021   Procedure: BIOPSY;  Surgeon: [name], MD;  Location: [facility] ENDOSCOPY;  Service: Gastroenterology;;   Thressa Sheller STUDY  08/02/2021   Procedure: BUBBLE STUDY;   Surgeon: Jake Bathe, MD;  Location: [facility] ENDOSCOPY;  Service: Cardiovascular;;   ESOPHAGOGASTRODUODENOSCOPY (EGD) WITH PROPOFOL N/A 11/27/2021   Procedure: ESOPHAGOGASTRODUODENOSCOPY (EGD) WITH PROPOFOL;  Surgeon: [name], MD;  Location: [facility] ENDOSCOPY;  Service: Gastroenterology;  Laterality: N/A;   FLEXIBLE SIGMOIDOSCOPY N/A 11/21/2021   Procedure: FLEXIBLE SIGMOIDOSCOPY;  Surgeon: Rachael Fee, MD;  Location: [facility] ENDOSCOPY;  Service: Gastroenterology;  Laterality: N/A;   LOOP RECORDER INSERTION N/A 08/02/2021   Procedure: LOOP RECORDER INSERTION;  Surgeon: Lanier Prude, MD;  Location: [facility] INVASIVE CV LAB;  Service: Cardiovascular;  Laterality: N/A;   TEE WITHOUT CARDIOVERSION N/A 08/02/2021   Procedure: TRANSESOPHAGEAL ECHOCARDIOGRAM (TEE);  Surgeon: Jake Bathe, MD;  Location: [facility] ENDOSCOPY;  Service: Cardiovascular;  Laterality: N/A;    Current Medications: No outpatient medications have been marked as taking for the 03/23/22 encounter (Office Visit) with Lanier Prude, MD.     Allergies:   Beef-derived products, Beta adrenergic blockers, Bystolic [nebivolol hcl], Chicken protein, Fish-derived products, Neurontin [gabapentin], Norvasc [amlodipine], Other, Pork-derived products, Trandate [labetalol], and Zestril [lisinopril]   Social History   Socioeconomic History   Marital status: Married    Spouse name: Not on file   Number of children: Not on file   Years of education: Not on file   Highest education level: Not on file  Occupational History   Not on file  Tobacco Use   Smoking status: Never   Smokeless tobacco: Never   Tobacco comments:    Never smoke 12/13/21  Vaping Use   Vaping Use: Never used  Substance and Sexual Activity   Alcohol use: No   Drug use: No   Sexual activity: Not on file  Other Topics Concern   Not on file  Social History Narrative   Not on file   Social Determinants of Health   Financial Resource Strain: Not on file  Food  Insecurity: Not on file  Transportation Needs: Not on file  Physical Activity: Not on file  Stress: Not on file  Social Connections: Not on file     Family History: The patient's family history is negative for Heart disease.  ROS:   Please see the history of present illness.     All other systems reviewed and are negative.  EKGs/Labs/Other Studies Reviewed:    The following studies were reviewed today:  Loop interrogation reviewed 1.8% AF burden    08/02/2021  Loop Recorder Insertion: CONCLUSIONS:   1. Successful implantation of a [employer] Reveal LINQ implantable loop recorder for a history of cryptogenic stroke  2. No early apparent complications.   08/02/2021  TEE:  1. PFO present.   2. Left ventricular ejection fraction, by estimation, is 60 to 65%. The left ventricle has normal function. The left ventricle has no regional wall motion abnormalities.   3. Right ventricular systolic function is normal. The right ventricular  size is normal.   4. No left atrial/left atrial appendage thrombus was detected.   5. The mitral valve is normal in structure. Mild mitral valve  regurgitation. No evidence of mitral stenosis.   6. The aortic valve is tricuspid. There is mild  calcification of the  aortic valve. There is mild thickening of the aortic valve. Aortic valve regurgitation is mild. Aortic valve sclerosis is present, with no evidence of aortic valve stenosis.   7. Aortic dilatation noted. There is borderline dilatation of the  ascending aorta, measuring 39 mm.   8. The inferior vena cava is normal in size with greater than 50%  respiratory variability, suggesting right atrial pressure of 3 mmHg.   9. Agitated saline contrast bubble study was positive with shunting  observed within 3-6 cardiac cycles suggestive of interatrial shunt. There is a small patent foramen ovale with bidirectional shunting across atrial septum.   07/30/2021  CTA head/neck: IMPRESSION: 1. Stable CTA  of the head and neck. No large vessel occlusion. 2. 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 3. Moderate calcified stenosis involving a proximal left M2 branch, with short-segment moderate left P2 stenoses, stable. 4. Diffuse tortuosity of the major arterial vasculature of the neck, suggesting chronic underlying hypertension. 5. 2 mm outpouching arising from the supraclinoid right ICA has an appearance most consistent with a vascular infundibulum on today's exam. No intracranial aneurysm.   EKG:   EKG is personally reviewed.  03/23/2022:  EKG was not ordered.  Recent Labs: 11/19/2021: ALT 12; TSH 2.465 11/24/2021: Magnesium 2.1 12/13/2021: BUN 31; Creatinine, Ser 2.36; Hemoglobin 9.0; Platelets 304; Potassium 4.4; Sodium 132   Recent Lipid Panel    Component Value Date/Time   CHOL 142 07/31/2021 0152   TRIG 49 07/31/2021 0152   HDL 55 07/31/2021 0152   CHOLHDL 2.6 07/31/2021 0152   VLDL 10 07/31/2021 0152   LDLCALC 77 07/31/2021 0152    Physical Exam:    VS:  BP 124/76   Pulse 96   Ht 5' (1.524 m)   Wt 106 lb 9.6 oz (48.4 kg)   SpO2 98%   BMI 20.82 kg/m     Wt Readings from Last 3 Encounters:  03/23/22 106 lb 9.6 oz (48.4 kg)  02/14/22 106 lb 9.6 oz (48.4 kg)  12/13/21 106 lb 9.6 oz (48.4 kg)     GEN: Elderly. Using rolator. In no distress. HEENT: Normal NECK: No JVD; No carotid bruits LYMPHATICS: No lymphadenopathy CARDIAC: RRR, no murmurs, rubs, gallops.  RESPIRATORY:  Clear to auscultation without rales, wheezing or rhonchi  ABDOMEN: Soft, non-tender, non-distended MUSCULOSKELETAL:  No edema; No deformity  SKIN: Warm and dry NEUROLOGIC:  Alert and oriented x 3 PSYCHIATRIC:  Normal affect       ASSESSMENT:    1. Paroxysmal atrial fibrillation (HCC)   2. 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 She has not tolerated  anticoagulation due to severe symptomatic anemia. She is being evaluated for Watchman evaluation.   I would like to start by getting a TEE to better assess the LAA suitability for device implant. I will plan to see her back after TEE to review results. At that appointment, plan to call in her daughter who is a physician in [place]. Also will plan for interpreter to discuss details of procedures, etc.  If we proceed with implant, plan for ASA and Plavix post implant.  ------------  I have seen Kallen Toy Care in the office today who is being considered for a Watchman left atrial appendage closure device. I believe they will benefit from this procedure given their history of atrial fibrillation, CHA2DS2-VASc  score of 6 and unadjusted ischemic stroke rate of 9.7% per year. Unfortunately, the patient is not felt to be a long term anticoagulation candidate secondary to anemia/GI bleeding. The patient's chart has been reviewed and I feel that they would be a candidate for short term oral anticoagulation after Watchman implant.   It is my belief that after undergoing a LAA closure procedure, Jezreel Sweigert will not need long term anticoagulation which eliminates anticoagulation side effects and major bleeding risk.   Procedural risks for the Watchman implant have been reviewed with the patient including a 0.5% risk of stroke, <1% risk of perforation and <1% risk of device embolization. Other risks include bleeding, vascular damage, tamponade, worsening renal function, and death.    The published clinical data on the safety and effectiveness of WATCHMAN include but are not limited to the following: - Holmes DR, Mechele Claude, Sick P et al. for the PROTECT AF Investigators. Percutaneous closure of the left atrial appendage versus warfarin therapy for prevention of stroke in patients with atrial fibrillation: a randomised non-inferiority trial. Lancet 2009; 374: 534-42. Mechele Claude, Doshi SK, Abelardo Diesel D et al. on  behalf of the PROTECT AF Investigators. Percutaneous Left Atrial Appendage Closure for Stroke Prophylaxis in Patients With Atrial Fibrillation 2.3-Year Follow-up of the PROTECT AF (Watchman Left Atrial Appendage System for Embolic Protection in Patients With Atrial Fibrillation) Trial. Circulation 2013; 127:720-729. - Alli O, Doshi S,  Kar S, Reddy VY, Sievert H et al. Quality of Life Assessment in the Randomized PROTECT AF (Percutaneous Closure of the Left Atrial Appendage Versus Warfarin Therapy for Prevention of Stroke in Patients With Atrial Fibrillation) Trial of Patients at Risk for Stroke With Nonvalvular Atrial Fibrillation. J Am Coll Cardiol 2013; N8865744. Vertell Limber DR, Tarri Abernethy, Price M, Eloy, Sievert H, Doshi S, Huber K, Reddy V. Prospective randomized evaluation of the Watchman left atrial appendage Device in patients with atrial fibrillation versus long-term warfarin therapy; the PREVAIL trial. Journal of the [employer] of Cardiology, Vol. 4, No. 1, 2014, 1-11. - Kar S, Doshi SK, Sadhu A, Horton R, Osorio J et al. Primary outcome evaluation of a next-generation left atrial appendage closure device: results from the PINNACLE FLX trial. Circulation 2021;143(18)1754-1762.   Prior to the procedure, I would like to obtain a TEE.  HAS-BLED score 5 Hypertension Yes  Abnormal renal and liver function (Dialysis, transplant, Cr >2.26 mg/dL /Cirrhosis or Bilirubin >2x Normal or AST/ALT/AP >3x Normal) Yes  Stroke Yes  Bleeding Yes  Labile INR (Unstable/high INR) No  Elderly (>65) Yes  Drugs or alcohol (? 8 drinks/week, anti-plt or NSAID) No   CHA2DS2-VASc Score = 6  The patient's score is based upon: CHF History: 0 HTN History: 1 Diabetes History: 0 Stroke History: 2 Vascular Disease History: 0 Age Score: 2 Gender Score: 1  Her daughter is a PCP Manpreet Budhan [phone]  Total time spent with patient today 60 minutes. This includes reviewing records, evaluating the  patient and coordinating care.  Medication Adjustments/Labs and Tests Ordered: Current medicines are reviewed at length with the patient today.  Concerns regarding medicines are outlined above.   No orders of the defined types were placed in this encounter.  No orders of the defined types were placed in this encounter.  I,Mathew Stumpf,acting as a Education administrator for Vickie Epley, MD.,have documented all relevant documentation on the behalf of Vickie Epley, MD,as directed by  Vickie Epley, MD while in the presence of Missie Gehrig  Chalmers Cater, MD.  I, Lanier Prude, MD, have reviewed all documentation for this visit. The documentation on 03/23/22 for the exam, diagnosis, procedures, and orders are all accurate and complete.   Signed, Rossie Muskrat. Lalla Brothers, MD, [facility], [facility] 03/23/2022 10:24 PM    Electrophysiology [facility]

## 2022-03-27 NOTE — Progress Notes (Signed)
Carelink Summary Report / Loop Recorder 

## 2022-04-04 ENCOUNTER — Ambulatory Visit (INDEPENDENT_AMBULATORY_CARE_PROVIDER_SITE_OTHER): Payer: Medicare Other

## 2022-04-04 DIAGNOSIS — I480 Paroxysmal atrial fibrillation: Secondary | ICD-10-CM | POA: Diagnosis not present

## 2022-04-05 ENCOUNTER — Ambulatory Visit (HOSPITAL_BASED_OUTPATIENT_CLINIC_OR_DEPARTMENT_OTHER): Payer: Medicare Other | Admitting: Anesthesiology

## 2022-04-05 ENCOUNTER — Encounter (HOSPITAL_COMMUNITY): Payer: Self-pay | Admitting: Cardiology

## 2022-04-05 ENCOUNTER — Ambulatory Visit (HOSPITAL_COMMUNITY)
Admission: RE | Admit: 2022-04-05 | Discharge: 2022-04-05 | Disposition: A | Discharge status: Home / Self Care | Payer: Medicare Other | Attending: Cardiology | Admitting: Cardiology

## 2022-04-05 ENCOUNTER — Ambulatory Visit (HOSPITAL_BASED_OUTPATIENT_CLINIC_OR_DEPARTMENT_OTHER)
Admission: RE | Admit: 2022-04-05 | Discharge: 2022-04-05 | Disposition: A | Discharge status: Home / Self Care | Payer: Medicare Other | Source: Ambulatory Visit | Attending: Cardiology | Admitting: Cardiology

## 2022-04-05 ENCOUNTER — Encounter (HOSPITAL_COMMUNITY)
Admission: RE | Disposition: A | Discharge status: Home / Self Care | Payer: Self-pay | Source: Home / Self Care | Attending: Cardiology

## 2022-04-05 ENCOUNTER — Other Ambulatory Visit: Payer: Self-pay

## 2022-04-05 ENCOUNTER — Ambulatory Visit (HOSPITAL_COMMUNITY): Payer: Medicare Other | Admitting: Anesthesiology

## 2022-04-05 DIAGNOSIS — I340 Nonrheumatic mitral (valve) insufficiency: Secondary | ICD-10-CM | POA: Diagnosis not present

## 2022-04-05 DIAGNOSIS — Z01818 Encounter for other preprocedural examination: Secondary | ICD-10-CM | POA: Diagnosis not present

## 2022-04-05 DIAGNOSIS — I10 Essential (primary) hypertension: Secondary | ICD-10-CM | POA: Diagnosis not present

## 2022-04-05 DIAGNOSIS — Z8673 Personal history of transient ischemic attack (TIA), and cerebral infarction without residual deficits: Secondary | ICD-10-CM

## 2022-04-05 DIAGNOSIS — I480 Paroxysmal atrial fibrillation: Secondary | ICD-10-CM | POA: Diagnosis not present

## 2022-04-05 DIAGNOSIS — I3139 Other pericardial effusion (noninflammatory): Secondary | ICD-10-CM

## 2022-04-05 DIAGNOSIS — I4891 Unspecified atrial fibrillation: Secondary | ICD-10-CM

## 2022-04-05 DIAGNOSIS — I63542 Cerebral infarction due to unspecified occlusion or stenosis of left cerebellar artery: Secondary | ICD-10-CM

## 2022-04-05 DIAGNOSIS — I080 Rheumatic disorders of both mitral and aortic valves: Secondary | ICD-10-CM | POA: Diagnosis not present

## 2022-04-05 DIAGNOSIS — I482 Chronic atrial fibrillation, unspecified: Secondary | ICD-10-CM

## 2022-04-05 HISTORY — PX: TEE WITHOUT CARDIOVERSION: SHX5443

## 2022-04-05 LAB — BASIC METABOLIC PANEL
Anion gap: 8 (ref 5–15)
BUN: 33 mg/dL — ABNORMAL HIGH (ref 8–23)
CO2: 26 mmol/L (ref 22–32)
Calcium: 9.2 mg/dL (ref 8.9–10.3)
Chloride: 101 mmol/L (ref 98–111)
Creatinine, Ser: 1.51 mg/dL — ABNORMAL HIGH (ref 0.44–1.00)
GFR, Estimated: 35 mL/min — ABNORMAL LOW (ref >60)
Glucose, Bld: 91 mg/dL (ref 70–99)
Potassium: 4 mmol/L (ref 3.5–5.1)
Sodium: 135 mmol/L (ref 135–145)

## 2022-04-05 LAB — CUP PACEART REMOTE DEVICE CHECK
Date Time Interrogation Session: 20230922230123
Implantable Pulse Generator Implant Date: 20230123

## 2022-04-05 LAB — CBC
HCT: 28.5 % — ABNORMAL LOW (ref 36.0–46.0)
Hemoglobin: 9.3 g/dL — ABNORMAL LOW (ref 12.0–15.0)
MCH: 31 pg (ref 26.0–34.0)
MCHC: 32.6 g/dL (ref 30.0–36.0)
MCV: 95 fL (ref 80.0–100.0)
Platelets: 214 10*3/uL (ref 150–400)
RBC: 3 MIL/uL — ABNORMAL LOW (ref 3.87–5.11)
RDW: 12.7 % (ref 11.5–15.5)
WBC: 5.2 10*3/uL (ref 4.0–10.5)
nRBC: 0 % (ref 0.0–0.2)

## 2022-04-05 SURGERY — ECHOCARDIOGRAM, TRANSESOPHAGEAL
Anesthesia: Monitor Anesthesia Care

## 2022-04-05 MED ORDER — SODIUM CHLORIDE 0.9 % IV SOLN: INTRAVENOUS | Status: DC

## 2022-04-05 MED ORDER — EPHEDRINE SULFATE-NACL 50-0.9 MG/10ML-% IV SOSY
PREFILLED_SYRINGE | INTRAVENOUS | Status: DC | PRN
  Administered 2022-04-05: 10 mg via INTRAVENOUS
  Administered 2022-04-05: 5 mg via INTRAVENOUS

## 2022-04-05 MED ORDER — PROPOFOL 10 MG/ML IV BOLUS
INTRAVENOUS | Status: DC | PRN
  Administered 2022-04-05 (×4): 20 mg via INTRAVENOUS

## 2022-04-05 MED ORDER — PROPOFOL 500 MG/50ML IV EMUL
INTRAVENOUS | Status: DC | PRN
  Administered 2022-04-05: 150 ug/kg/min via INTRAVENOUS

## 2022-04-05 MED ORDER — LIDOCAINE 2% (20 MG/ML) 5 ML SYRINGE
INTRAMUSCULAR | Status: DC | PRN
  Administered 2022-04-05: 60 mg via INTRAVENOUS

## 2022-04-05 NOTE — Anesthesia Preprocedure Evaluation (Signed)
Anesthesia Evaluation  Patient identified by MRN, date of birth, ID band Patient awake    Reviewed: Allergy & Precautions, NPO status , Patient's Chart, lab work & pertinent test results  Airway Mallampati: III  TM Distance: >3 FB Neck ROM: Full    Dental  (+) Edentulous Upper, Edentulous Lower   Pulmonary neg pulmonary ROS,    Pulmonary exam normal breath sounds clear to auscultation       Cardiovascular hypertension (161/73 preop), Pt. on medications Normal cardiovascular exam+ dysrhythmias Atrial Fibrillation + Valvular Problems/Murmurs (mild MR, mild AI) MR and AI  Rhythm:Irregular Rate:Normal  Echo 07/2021 1. PFO present.  2. Left ventricular ejection fraction, by estimation, is 60 to 65%. The  left ventricle has normal function. The left ventricle has no regional  wall motion abnormalities.  3. Right ventricular systolic function is normal. The right ventricular  size is normal.  4. No left atrial/left atrial appendage thrombus was detected.  5. The mitral valve is normal in structure. Mild mitral valve  regurgitation. No evidence of mitral stenosis.  6. The aortic valve is tricuspid. There is mild calcification of the  aortic valve. There is mild thickening of the aortic valve. Aortic valve  regurgitation is mild. Aortic valve sclerosis is present, with no evidence  of aortic valve stenosis.  7. Aortic dilatation noted. There is borderline dilatation of the  ascending aorta, measuring 39 mm.  8. The inferior vena cava is normal in size with greater than 50%  respiratory variability, suggesting right atrial pressure of 3 mmHg.  9. Agitated saline contrast bubble study was positive with shunting  observed within 3-6 cardiac cycles suggestive of interatrial shunt. There  is a small patent foramen ovale with bidirectional shunting across atrial  septum.    Neuro/Psych PSYCHIATRIC DISORDERS Depression TIACVA (PFO on  echo)    GI/Hepatic Neg liver ROS, PUD, GERD  Medicated and Controlled,  Endo/Other  negative endocrine ROS  Renal/GU negative Renal ROS  negative genitourinary   Musculoskeletal  (+) Arthritis , Osteoarthritis,    Abdominal   Peds negative pediatric ROS (+)  Hematology negative hematology ROS (+)   Anesthesia Other Findings   Reproductive/Obstetrics negative OB ROS                             Anesthesia Physical Anesthesia Plan  ASA: 3  Anesthesia Plan: MAC   Post-op Pain Management:    Induction:   PONV Risk Score and Plan: 2 and Propofol infusion and TIVA  Airway Management Planned: Natural Airway and Simple Face Mask  Additional Equipment: None  Intra-op Plan:   Post-operative Plan:   Informed Consent: I have reviewed the patients History and Physical, chart, labs and discussed the procedure including the risks, benefits and alternatives for the proposed anesthesia with the patient or authorized representative who has indicated his/her understanding and acceptance.       Plan Discussed with: CRNA  Anesthesia Plan Comments:         Anesthesia Quick Evaluation

## 2022-04-05 NOTE — Interval H&P Note (Signed)
History and Physical Interval Note:  04/05/2022 9:16 AM  Kim [name]  has presented today for surgery, with the diagnosis of atrial fibrillation.  The various methods of treatment have been discussed with the patient and family. After consideration of risks, benefits and other options for treatment, the patient has consented to  Procedure(s): TRANSESOPHAGEAL ECHOCARDIOGRAM (TEE) (N/A) as a surgical intervention.  The patient's history has been reviewed, patient examined, no change in status, stable for surgery.  I have reviewed the patient's chart and labs.  Questions were answered to the patient's satisfaction.     Kirk Ruths

## 2022-04-05 NOTE — Anesthesia Postprocedure Evaluation (Signed)
Anesthesia Post Note  Patient: Nakia Toy Care  Procedure(s) Performed: TRANSESOPHAGEAL ECHOCARDIOGRAM (TEE)     Patient location during evaluation: PACU Anesthesia Type: MAC Level of consciousness: awake and alert Pain management: pain level controlled Vital Signs Assessment: post-procedure vital signs reviewed and stable Respiratory status: spontaneous breathing, nonlabored ventilation and respiratory function stable Cardiovascular status: blood pressure returned to baseline and stable Postop Assessment: no apparent nausea or vomiting Anesthetic complications: no   No notable events documented.  Last Vitals:  Vitals:   04/05/22 0914 04/05/22 1004  BP: (!) 161/73 (!) 101/56  Pulse:  (!) 52  Resp: 12 15  Temp: (!) 36.1 C (!) 36.4 C  SpO2:  99%    Last Pain:  Vitals:   04/05/22 1004  TempSrc:   PainSc: Asleep   Pain Goal:                   Pervis Hocking

## 2022-04-05 NOTE — H&P (Signed)
Office Visit  03/23/2022 [facility] [place]  Kim Mage T, MD Cardiology Paroxysmal atrial fibrillation [facility]) +1 more Dx Referred by Kim Jews, PA-C Reason for Visit   Additional Documentation  Vitals:  BP 124/76 Pulse 96 Ht 5' (1.524 m) Wt 48.4 kg SpO2 98% BMI 20.82 kg/m BSA 1.43 m  Flowsheets:  NEWS, MEWS Score, Anthropometrics, CHADS2-VASc Score   Encounter Info:  Billing Info, History, Allergies, Detailed Report    All Notes    Progress Notes by Kim Epley, MD at 03/23/2022 3:00 PM  Author: Vickie Epley, MD Author Type: Physician Filed: 03/23/2022 10:26 PM  Note Status: Signed Cosign: Cosign Not Required Encounter Date: 03/23/2022  Editor: Kim Epley, MD (Physician)      Prior Versions: 1. Kim [name] at 03/23/2022  4:00 PM - Incomplete  Expand AllCollapse All    Electrophysiology Office Note:     Date:  03/23/2022    ID:  Kim [name], DOB October 11, 1945, MRN [id number]   PCP:  Kim Jews, PA-C      [facility] Cardiologist:  None  [facility] Electrophysiologist:  Kim Epley, MD    Referring MD: Kim Jews, PA-C    Chief Complaint: Watchman consult   History of Present Illness:     Kim [name] is a 76 y.o. female who presents for an evaluation for watchman consult. 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 She has also been followed by [facility] and Vascular Cardiology, last seen by them 12/23/2021.   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 She was given aspirin 325 mg, Plavix 300 mg and admitted for stroke work-up. Admitted to hospitalist service on 12/18. MRI  brain showed an acute left superior cerebellar artery territory infarct with associated hemosiderin deposition, likely petechial hemorrhage and mild edema causing mild local mass-effect. Echo showed LVEF 60-65%, moderate LVH, and grade 1 diastolic dysfunction. She was discharged aspirin and Plavix for 3 weeks, followed by aspirin alone. It was recommended she have a loop recorder placed, but this was refused by the family.   On 07/30/2021 she was readmitted with transient right sided weakness and facial droop for about 15 minutes. Per hospital notes, "MRI showed an acute left parietal lobe infarct. No atrial fibrillation was detected on cardiac monitoring and echocardiogram revealed no interatrial shunt or cardioembolic source. TEE and loop recorder implantation was pursued prior to discharge. Neurology had recommended increasing aspirin from 81mg  to 325mg  and continuing plavix and statin in hopes of secondary stroke risk reduction." Follow-up with EP arranged on discharge. Aspirin 325 mg and Plavix would be continued for 3 months, then transitioned to Aspirin alone given her left M2 stenosis on CTA. Crestor was increased from 20 mg to 40 mg.   On 11/08/2021 LINQ alert received. There were 2 new AF events, longest duration 34min, mean HR 125. Burden 5.3%, no OAC, DAPT. Reviewed with Dr. Curt Bears and agreed AF. Recommended AF clinic for possible [facility] therapy start.    She most recently saw Kim Palau, NP in the Afib clinic on 02/14/2022. At that visit, she was bradycardic but not symptomatic. She remained off eliquis with a H/H of  8.9/27 as of  7/6/3. On last paceart, no arrhythmias were reported. She had been referred to GI by her PCP that saw pt 01/13/22. No further overt GI bleeding. She also has a cardiologist in [place] that referred her to EP to be considered for Watchman. Eliquis still held due to recent anemia requiring transfusion. CHA2DS2VASc score of 6.   Today, she is accompanied by her daughter-in-law who  assists with translation and interpretation.     Generally she is not able to tell when she is in atrial fibrillation; she is asymptomatic.    For activity she will walk around her house. She was also able to walk to this office from the parking lot.   She denies any palpitations, chest [name], shortness of breath, or peripheral edema. No lightheadedness, headaches, syncope, orthopnea, or PND.       Objective      Past Medical History:  Diagnosis Date   AKI (acute kidney injury) ([place]) 11/18/2021   Arthritis     Atrial fibrillation ([place])     Depression     Essential hypertension 06/28/2021   Mixed hyperlipidemia 06/28/2021   Restless leg syndrome 06/28/2021   Stroke [facility])      x2           Past Surgical History:  Procedure Laterality Date   BIOPSY   11/27/2021    Procedure: BIOPSY;  Surgeon: Kim Creamer, MD;  Location: [place];  Service: Gastroenterology;;   Kim [name] STUDY   08/02/2021    Procedure: BUBBLE STUDY;  Surgeon: Kim Pain, MD;  Location: [place] ENDOSCOPY;  Service: Cardiovascular;;   ESOPHAGOGASTRODUODENOSCOPY (EGD) WITH PROPOFOL N/A 11/27/2021    Procedure: ESOPHAGOGASTRODUODENOSCOPY (EGD) WITH PROPOFOL;  Surgeon: Kim Creamer, MD;  Location[facility] ENDOSCOPY;  Service: Gastroenterology;  Laterality: N/A;   FLEXIBLE SIGMOIDOSCOPY N/A 11/21/2021    Procedure: FLEXIBLE SIGMOIDOSCOPY;  Surgeon: Kim Banister, MD;  Location: [facility] ENDOSCOPY;  Service: Gastroenterology;  Laterality: N/A;   LOOP RECORDER INSERTION N/A 08/02/2021    Procedure: LOOP RECORDER INSERTION;  Surgeon: Kim Epley, MD;  Location: [place] CV LAB;  Service: Cardiovascular;  Laterality: N/A;   TEE WITHOUT CARDIOVERSION N/A 08/02/2021    Procedure: TRANSESOPHAGEAL ECHOCARDIOGRAM (TEE);  Surgeon: Kim Pain, MD;  Location: [facility] ENDOSCOPY;  Service: Cardiovascular;  Laterality: N/A;      Current Medications: Active Medications  No outpatient medications have been marked as taking for the  03/23/22 encounter (Office Visit) with Kim Epley, MD.        Allergies:   Beef-derived products, Beta adrenergic blockers, Bystolic [nebivolol hcl], Chicken protein, Fish-derived products, Neurontin [gabapentin], Norvasc [amlodipine], Other, Pork-derived products, Trandate [labetalol], and Zestril [lisinopril]    Social History         Socioeconomic History   Marital status: Married      Spouse name: Not on file   Number of children: Not on file   Years of education: Not on file   Highest education level: Not on file  Occupational History   Not on file  Tobacco Use   Smoking status: Never   Smokeless tobacco: Never   Tobacco comments:      Never smoke 12/13/21  Vaping Use   Vaping Use: Never used  Substance and Sexual Activity   Alcohol use: No   Drug use: No   Sexual activity: Not on file  Other Topics Concern   Not on file  Social History Narrative   Not on file    Social Determinants  of Health    Financial Resource Strain: Not on file  Food Insecurity: Not on file  Transportation Needs: Not on file  Physical Activity: Not on file  Stress: Not on file  Social Connections: Not on file      Family History: The patient's family history is negative for Heart disease.   ROS:   Please see the history of present illness.      All other systems reviewed and are negative.   EKGs/Labs/Other Studies Reviewed:     The following studies were reviewed today:   Loop interrogation reviewed 1.8% AF burden       08/02/2021  Loop Recorder Insertion: CONCLUSIONS:   1. Successful implantation of a [employer] Reveal LINQ implantable loop recorder for a history of cryptogenic stroke  2. No early apparent complications.    08/02/2021  TEE:  1. PFO present.   2. Left ventricular ejection fraction, by estimation, is 60 to 65%. The left ventricle has normal function. The left ventricle has no regional wall motion abnormalities.   3. Right ventricular systolic function  is normal. The right ventricular  size is normal.   4. No left atrial/left atrial appendage thrombus was detected.   5. The mitral valve is normal in structure. Mild mitral valve  regurgitation. No evidence of mitral stenosis.   6. The aortic valve is tricuspid. There is mild calcification of the  aortic valve. There is mild thickening of the aortic valve. Aortic valve regurgitation is mild. Aortic valve sclerosis is present, with no evidence of aortic valve stenosis.   7. Aortic dilatation noted. There is borderline dilatation of the  ascending aorta, measuring 39 mm.   8. The inferior vena cava is normal in size with greater than 50%  respiratory variability, suggesting right atrial pressure of 3 mmHg.   9. Agitated saline contrast bubble study was positive with shunting  observed within 3-6 cardiac cycles suggestive of interatrial shunt. There is a small patent foramen ovale with bidirectional shunting across atrial septum.    07/30/2021  CTA head/neck: IMPRESSION: 1. Stable CTA of the head and neck. No large vessel occlusion. 2. 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 3. Moderate calcified stenosis involving a proximal left M2 branch, with short-segment moderate left P2 stenoses, stable. 4. Diffuse tortuosity of the major arterial vasculature of the neck, suggesting chronic underlying hypertension. 5. 2 mm outpouching arising from the supraclinoid right ICA has an appearance most consistent with a vascular infundibulum on today's exam. No intracranial aneurysm.     EKG:   EKG is personally reviewed.  03/23/2022:  EKG was not ordered.   Recent Labs: 11/19/2021: ALT 12; TSH 2.465 11/24/2021: Magnesium 2.1 12/13/2021: BUN 31; Creatinine, Ser 2.36; Hemoglobin 9.0; Platelets 304; Potassium 4.4; Sodium 132    Recent Lipid Panel Labs (Brief)          Component Value Date/Time    CHOL 142 07/31/2021 0152    TRIG 49 07/31/2021 0152    HDL 55 07/31/2021 0152    CHOLHDL 2.6  07/31/2021 0152    VLDL 10 07/31/2021 0152    LDLCALC 77 07/31/2021 0152        Physical Exam:     VS:  BP 124/76   Pulse 96   Ht 5' (1.524 m)   Wt 106 lb 9.6 oz (48.4 kg)   SpO2 98%   BMI 20.82 kg/m         Wt Readings from Last 3 Encounters:  03/23/22  106 lb 9.6 oz (48.4 kg)  02/14/22 106 lb 9.6 oz (48.4 kg)  12/13/21 106 lb 9.6 oz (48.4 kg)      GEN: Elderly. Using rolator. In no distress. HEENT: Normal NECK: No JVD; No carotid bruits LYMPHATICS: No lymphadenopathy CARDIAC: RRR, no murmurs, rubs, gallops.  RESPIRATORY:  Clear to auscultation without rales, wheezing or rhonchi  ABDOMEN: Soft, non-tender, non-distended MUSCULOSKELETAL:  No edema; No deformity  SKIN: Warm and dry NEUROLOGIC:  Alert and oriented x 3 PSYCHIATRIC:  Normal affect          Assessment ASSESSMENT:     1. Paroxysmal atrial fibrillation (HCC)   2. 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 She has not tolerated anticoagulation due to severe symptomatic anemia. She is being evaluated for Watchman evaluation.    I would like to start by getting a TEE to better assess the LAA suitability for device implant. I will plan to see her back after TEE to review results. At that appointment, plan to call in her daughter who is a physician in [place]. Also will plan for interpreter to discuss details of procedures, etc.   If we proceed with implant, plan for ASA and Plavix post implant.   ------------   I have seen Kim [name] in the office today who is being considered for a Watchman left atrial appendage closure device. I believe they will benefit from this procedure given their history of atrial fibrillation, CHA2DS2-VASc score of 6 and unadjusted ischemic stroke rate of 9.7% per year. Unfortunately, the patient is not felt to be a long term anticoagulation candidate secondary to  anemia/GI bleeding. The patient's chart has been reviewed and I feel that they would be a candidate for short term oral anticoagulation after Watchman implant.    It is my belief that after undergoing a LAA closure procedure, Kim [name] will not need long term anticoagulation which eliminates anticoagulation side effects and major bleeding risk.    Procedural risks for the Watchman implant have been reviewed with the patient including a 0.5% risk of stroke, <1% risk of perforation and <1% risk of device embolization. Other risks include bleeding, vascular damage, tamponade, worsening renal function, and death.      The published clinical data on the safety and effectiveness of WATCHMAN include but are not limited to the following: - Holmes DR, Mechele Claude, Sick P et al. for the PROTECT AF Investigators. Percutaneous closure of the left atrial appendage versus warfarin therapy for prevention of stroke in patients with atrial fibrillation: a randomised non-inferiority trial. Lancet 2009; 374: 534-42. Mechele Claude, Doshi SK, Abelardo Diesel D et al. on behalf of the PROTECT AF Investigators. Percutaneous Left Atrial Appendage Closure for Stroke Prophylaxis in Patients With Atrial Fibrillation 2.3-Year Follow-up of the PROTECT AF (Watchman Left Atrial Appendage System for Embolic Protection in Patients With Atrial Fibrillation) Trial. Circulation 2013; 127:720-729. - Alli O, Doshi S,  Kar S, Reddy VY, Sievert H et al. Quality of Life Assessment in the Randomized PROTECT AF (Percutaneous Closure of the Left Atrial Appendage Versus Warfarin Therapy for Prevention of Stroke in Patients With Atrial Fibrillation) Trial of Patients at Risk for Stroke With Nonvalvular Atrial Fibrillation. J Am Coll Cardiol 2013; P4788364. Vertell Limber DR, Tarri Abernethy, Price Jerilynn Mages, [place], Sievert H, Rolene Course Prospective  randomized evaluation of the Watchman left atrial appendage Device in patients with atrial fibrillation  versus long-term warfarin therapy; the PREVAIL trial. Journal of the [employer] of Cardiology, Vol. 4, No. 1, 2014, 1-11. - Kar S, Doshi SK, Sadhu A, Horton R, Osorio J et al. Primary outcome evaluation of a next-generation left atrial appendage closure device: results from the PINNACLE FLX trial. Circulation 2021;143(18)1754-1762.    Prior to the procedure, I would like to obtain a TEE.   HAS-BLED score 5 Hypertension Yes  Abnormal renal and liver function (Dialysis, transplant, Cr >2.26 mg/dL /Cirrhosis or Bilirubin >2x Normal or AST/ALT/AP >3x Normal) Yes  Stroke Yes  Bleeding Yes  Labile INR (Unstable/high INR) No  Elderly (>65) Yes  Drugs or alcohol (? 8 drinks/week, anti-plt or NSAID) No    CHA2DS2-VASc Score = 6  The patient's score is based upon: CHF History: 0 HTN History: 1 Diabetes History: 0 Stroke History: 2 Vascular Disease History: 0 Age Score: 2 Gender Score: 1   Her daughter is a PCP Manpreet Budhan [phone]   Total time spent with patient today 60 minutes. This includes reviewing records, evaluating the patient and coordinating [name].   Medication Adjustments/Labs and Tests Ordered: Current medicines are reviewed at length with the patient today.  Concerns regarding medicines are outlined above.    No orders of the defined types were placed in this encounter.   No orders of the defined types were placed in this encounter.   I,Mathew Stumpf,acting as a Education administrator for Kim Epley, MD.,have documented all relevant documentation on the behalf of Kim Epley, MD,as directed by  Kim Epley, MD while in the presence of Kim Epley, MD.   I, Kim Epley, MD, have reviewed all documentation for this visit. The documentation on 03/23/22 for the exam, diagnosis, procedures, and orders are all accurate and complete.     Signed, Hilton Cork. Quentin Ore, MD, [facility], [facility] 03/23/2022 10:24 PM    Electrophysiology [facility]       For TEE prewatchman; no changes Kirk Ruths

## 2022-04-05 NOTE — Transfer of Care (Signed)
Immediate Anesthesia Transfer of [name] Note  Patient: Kim [name]  Procedure(s) Performed: TRANSESOPHAGEAL ECHOCARDIOGRAM (TEE)  Patient Location: PACU  Anesthesia Type:MAC  Level of Consciousness: drowsy  Airway & Oxygen Therapy: Patient Spontanous Breathing and Patient connected to nasal cannula oxygen  Post-op Assessment: Report given to RN and Post -op Vital signs reviewed and stable  Post vital signs: Reviewed and stable  Last Vitals:  Vitals Value Taken Time  BP 101/56 04/05/22 1004  Temp    Pulse 45 04/05/22 1005  Resp 12 04/05/22 1005  SpO2 98 % 04/05/22 1005  Vitals shown include unvalidated device data.  Last Pain:  Vitals:   04/05/22 0914  TempSrc: Tympanic  PainSc: 0-No pain         Complications: No notable events documented.

## 2022-04-05 NOTE — Progress Notes (Signed)
     Transesophageal Echocardiogram Note  Shariyah Eland [id number] [birth year]  Procedure: Transesophageal Echocardiogram Indications: Atrial fibrillation; prewatchman  Procedure Details Consent: Obtained Time Out: Verified patient identification, verified procedure, site/side was marked, verified correct patient position, special equipment/implants available, Radiology Safety Procedures followed,  medications/allergies/relevent history reviewed, required imaging and test results available.  Performed  Medications:  Pt sedated by anesthesia with diprovan 270 mg IV total.  Normal LV function; moderate LAE; no LAA thrombus; cauliflower LAA; normal RA and RV; PFO noted; sclerotic aortic valve with mild AI; mild MR and TR; small pericardial effusion.   Complications: No apparent complications Patient did tolerate procedure well.  Kirk Ruths, MD

## 2022-04-05 NOTE — Anesthesia Procedure Notes (Signed)
Procedure Name: MAC Date/Time: 04/05/2022 9:41 AM  Performed by: Lieutenant Diego, CRNAPre-anesthesia Checklist: Patient identified, Emergency Drugs available, Suction available, Patient being monitored and Timeout performed Oxygen Delivery Method: Nasal cannula Preoxygenation: Pre-oxygenation with 100% oxygen Induction Type: IV induction

## 2022-04-05 NOTE — Progress Notes (Signed)
  Echocardiogram Echocardiogram Transesophageal has been performed.  Kim [name] M 04/05/2022, 11:38 AM

## 2022-04-06 ENCOUNTER — Encounter (HOSPITAL_COMMUNITY): Payer: Self-pay | Admitting: Cardiology

## 2022-04-06 ENCOUNTER — Telehealth: Payer: Self-pay

## 2022-04-06 NOTE — Telephone Encounter (Signed)
Werner Lean, MD  Theodoro Parma, RN Cauliflower with multiple lobes  Proximal diameter max 24 mm, average 21 mm.  Depth greater than 2.5 cm.  A 27 mm device has suitable depth and a compression of 20%.  No thrombus   Thanks,  MAC

## 2022-04-12 ENCOUNTER — Telehealth: Payer: Self-pay

## 2022-04-12 NOTE — Telephone Encounter (Signed)
Discussed with Interpreter Services and reiterated the importance of having a Punjabi interpreter at the 11/2 appointment with Dr. Quentin Ore. Janett Billow reports there is one [place] interpreter so the iPad at the office may have to be used if that interpreter is unavailable, but she will try to have one there in person. Janett Billow was thanked for her time.

## 2022-04-12 NOTE — Telephone Encounter (Signed)
-----   Message from Vickie Epley, MD sent at 04/10/2022 10:05 AM EDT ----- Alycia Rossetti, TEE looks good. I will discuss with her at her upcoming follow up appointment. Can you help me make sure we have an interpreter at the appointment? Thanks, Lars Mage       ----- Message ----- From: Theodoro Parma, RN Sent: 04/06/2022   8:25 AM EDT To: Vickie Epley, MD  Per Dr. Gasper Sells, "Werner Lean, MD  Theodoro Parma, RN Cauliflower with multiple lobes  Proximal diameter max 24 mm, average 21 mm. Depth greater than 2.5 cm.  A 27 mm device has suitable depth and a compression of 20%.  No thrombus   Thanks,  MAC"

## 2022-04-18 NOTE — Progress Notes (Signed)
Carelink Summary Report / Loop Recorder 

## 2022-05-09 ENCOUNTER — Ambulatory Visit: Payer: Medicare Other | Attending: Cardiology

## 2022-05-09 DIAGNOSIS — I480 Paroxysmal atrial fibrillation: Secondary | ICD-10-CM

## 2022-05-09 LAB — CUP PACEART REMOTE DEVICE CHECK
Date Time Interrogation Session: 20231025230420
Implantable Pulse Generator Implant Date: 20230123

## 2022-05-12 ENCOUNTER — Encounter: Payer: Self-pay | Admitting: Cardiology

## 2022-05-12 ENCOUNTER — Ambulatory Visit: Payer: Medicare Other | Attending: Cardiology | Admitting: Cardiology

## 2022-05-12 VITALS — BP 124/68 | HR 59 | Ht 59.0 in | Wt 105.0 lb

## 2022-05-12 DIAGNOSIS — I5032 Chronic diastolic (congestive) heart failure: Secondary | ICD-10-CM | POA: Diagnosis not present

## 2022-05-12 DIAGNOSIS — G459 Transient cerebral ischemic attack, unspecified: Secondary | ICD-10-CM | POA: Insufficient documentation

## 2022-05-12 DIAGNOSIS — I482 Chronic atrial fibrillation, unspecified: Secondary | ICD-10-CM | POA: Insufficient documentation

## 2022-05-12 MED ORDER — APIXABAN 2.5 MG PO TABS: 2.5000 mg | ORAL_TABLET | Freq: Two times a day (BID) | ORAL | 3 refills | Status: AC

## 2022-05-12 NOTE — Progress Notes (Signed)
Electrophysiology Office Follow up Visit Note:    Date:  05/12/2022   ID:  Kim [name], DOB [birth year], MRN [id number]  PCP:  Judd Lien, PA-C  [facility] Cardiologist:  None  [facility] Electrophysiologist:  Lanier Prude, MD    Interval History:    Kim [name] is a 76 y.o. female who presents for a follow up visit. They were last seen in clinic 03/23/2022 for watchman evaluation. She had a TEE after I saw her for LAA assessment.   She is not currently on OAC given anemia requiring transfusion. She has experienced multiple stroke events.   Today, she is accompanied by an in person interpreter. She is also accompanied by a family member and her daughter by phone. Her daughter is a PCP and we had a prolonged discussed regarding her alternative rhythm management strategies in detail, including the watchman procedure.   Kim. Knoche appeared well. Her family will assist in explaining our plan as detailed below.  She denies any palpitations, chest pain, shortness of breath, or peripheral edema. No lightheadedness, headaches, syncope, orthopnea, or PND.      Past Medical History:  Diagnosis Date   AKI (acute kidney injury) (HCC) 11/18/2021   Arthritis    Atrial fibrillation (HCC)    Depression    Essential hypertension 06/28/2021   Mixed hyperlipidemia 06/28/2021   Restless leg syndrome 06/28/2021   Stroke [facility])    x2    Past Surgical History:  Procedure Laterality Date   BIOPSY  11/27/2021   Procedure: BIOPSY;  Surgeon: [name], MD;  Location: [facility] ENDOSCOPY;  Service: Gastroenterology;;   Thressa Sheller STUDY  08/02/2021   Procedure: BUBBLE STUDY;  Surgeon: Jake Bathe, MD;  Location: [facility] ENDOSCOPY;  Service: Cardiovascular;;   ESOPHAGOGASTRODUODENOSCOPY (EGD) WITH PROPOFOL N/A 11/27/2021   Procedure: ESOPHAGOGASTRODUODENOSCOPY (EGD) WITH PROPOFOL;  Surgeon: [name], MD;  Location: [facility] ENDOSCOPY;  Service: Gastroenterology;  Laterality: N/A;   FLEXIBLE  SIGMOIDOSCOPY N/A 11/21/2021   Procedure: FLEXIBLE SIGMOIDOSCOPY;  Surgeon: Rachael Fee, MD;  Location: [facility] ENDOSCOPY;  Service: Gastroenterology;  Laterality: N/A;   LOOP RECORDER INSERTION N/A 08/02/2021   Procedure: LOOP RECORDER INSERTION;  Surgeon: Lanier Prude, MD;  Location: [facility] INVASIVE CV LAB;  Service: Cardiovascular;  Laterality: N/A;   TEE WITHOUT CARDIOVERSION N/A 08/02/2021   Procedure: TRANSESOPHAGEAL ECHOCARDIOGRAM (TEE);  Surgeon: Jake Bathe, MD;  Location: [facility] ENDOSCOPY;  Service: Cardiovascular;  Laterality: N/A;   TEE WITHOUT CARDIOVERSION N/A 04/05/2022   Procedure: TRANSESOPHAGEAL ECHOCARDIOGRAM (TEE);  Surgeon: Lewayne Bunting, MD;  Location: [facility] ENDOSCOPY;  Service: Cardiovascular;  Laterality: N/A;    Current Medications: Current Meds  Medication Sig   clotrimazole-betamethasone (LOTRISONE) cream Apply 1 application. topically as needed (rash).   hydrALAZINE (APRESOLINE) 50 MG tablet Take 50 mg by mouth in the morning and at bedtime.   hydroxychloroquine (PLAQUENIL) 200 MG tablet Take 200 mg by mouth daily.   magnesium oxide (MAG-OX) 400 MG tablet Take 400 mg by mouth every evening.   pantoprazole (PROTONIX) 40 MG tablet Take 40 mg by mouth daily.   polyethylene glycol (MIRALAX / GLYCOLAX) 17 g packet Take 17 g by mouth daily as needed for mild constipation. (Patient taking differently: Take 17 g by mouth daily.)   rOPINIRole (REQUIP) 1 MG tablet Take 1 mg by mouth at bedtime.   rosuvastatin (CRESTOR) 40 MG tablet Take 1 tablet (40 mg total) by mouth daily.   senna-docusate (SENOKOT-S) 8.6-50 MG tablet Take 1  tablet by mouth every morning.   sodium bicarbonate 650 MG tablet Take 650 mg by mouth daily.   traMADol (ULTRAM) 50 MG tablet Take 25 mg by mouth daily.     Allergies:   Beef-derived products, Beta adrenergic blockers, Bystolic [nebivolol hcl], Chicken protein, Fish-derived products, Neurontin [gabapentin], Norvasc [amlodipine], Other, Pork-derived  products, Trandate [labetalol], and Zestril [lisinopril]   Social History   Socioeconomic History   Marital status: Married    Spouse name: Not on file   Number of children: Not on file   Years of education: Not on file   Highest education level: Not on file  Occupational History   Not on file  Tobacco Use   Smoking status: Never   Smokeless tobacco: Never   Tobacco comments:    Never smoke 12/13/21  Vaping Use   Vaping Use: Never used  Substance and Sexual Activity   Alcohol use: No   Drug use: No   Sexual activity: Not on file  Other Topics Concern   Not on file  Social History Narrative   Not on file   Social Determinants of Health   Financial Resource Strain: Not on file  Food Insecurity: Not on file  Transportation Needs: Not on file  Physical Activity: Not on file  Stress: Not on file  Social Connections: Not on file     Family History: The patient's family history is negative for Heart disease.  ROS:   Please see the history of present illness.     All other systems reviewed and are negative.  EKGs/Labs/Other Studies Reviewed:    The following studies were reviewed today:  04/05/2022 TEE EF 55 RV normal LA dilated Small Peff Cauliflower LAA morphology; measurements (0 2.3 x 3.1 cm; 30 2.4 x 2.5  cm; 60 1.6 x 2.6 cm; 90 2 x 2.3 cm; 130 1.4 x 2.5 cm).     Recent Labs: 11/19/2021: ALT 12; TSH 2.465 11/24/2021: Magnesium 2.1 04/05/2022: BUN 33; Creatinine, Ser 1.51; Hemoglobin 9.3; Platelets 214; Potassium 4.0; Sodium 135   Recent Lipid Panel    Component Value Date/Time   CHOL 142 07/31/2021 0152   TRIG 49 07/31/2021 0152   HDL 55 07/31/2021 0152   CHOLHDL 2.6 07/31/2021 0152   VLDL 10 07/31/2021 0152   LDLCALC 77 07/31/2021 0152    Physical Exam:    VS:  BP 124/68   Pulse (!) 59   Ht 4\' 11"  (1.499 m)   Wt 105 lb (47.6 kg)   SpO2 98%   BMI 21.21 kg/m     Wt Readings from Last 3 Encounters:  05/12/22 105 lb (47.6 kg)  04/05/22 105  lb (47.6 kg)  03/23/22 106 lb 9.6 oz (48.4 kg)     GEN: Well nourished, well developed in no acute distress. Elderly. HEENT: Normal NECK: No JVD; No carotid bruits LYMPHATICS: No lymphadenopathy CARDIAC: RRR, no murmurs, rubs, gallops RESPIRATORY:  Clear to auscultation without rales, wheezing or rhonchi  ABDOMEN: Soft, non-tender, non-distended MUSCULOSKELETAL:  No edema; No deformity  SKIN: Warm and dry NEUROLOGIC:  Alert and oriented x 3 PSYCHIATRIC:  Normal affect        ASSESSMENT:    1. Atrial fibrillation, chronic ([place])   2. Chronic heart failure with preserved ejection fraction ([place])   3. TIA (transient ischemic attack)    PLAN:    In order of problems listed above:  #Paroxysmal AF #TIA Kim [name] is doing well. Her family is with her today including her  daughter who is a PCP out of [place]. We discussed her stroke risk which is estimated at 9.7% per year. We discussed stroke risk mitigation strategies including Aspirin, low dose Eliquis (dosed for age, weight) and left atrial appendage occlusion. Based on her recent TEE she would be a candidate for Watchman but the patient is very clear that she would like to avoid invasive procedures. I think this is very reasonable. I discussed Eliquis and its risks in detail with the patient and her family. While she technically would qualify for 5mg  PO BID given her age and weight, she is very close to 76 years of age which would push the dose down to 2.5mg  PO BID. Given her past history of anemia, I think it is reasonable to go ahead and start at the lower dose in hopes of having a lower bleeding risk. This was explained to the patient and her family who are understanding and in agreement. The plan will be to start the Eliquis 2.5mg  PO BID and check Hgb in 2 weeks, 30 days and 60 days to make sure her Hgb remains stable on AC. I will have her follow up in 3 months with an APP in clinic.     HAS-BLED score 5 Hypertension Yes  Abnormal  renal and liver function (Dialysis, transplant, Cr >2.26 mg/dL /Cirrhosis or Bilirubin >2x Normal or AST/ALT/AP >3x Normal) Yes  Stroke Yes  Bleeding Yes  Labile INR (Unstable/high INR) No  Elderly (>65) Yes  Drugs or alcohol (? 8 drinks/week, anti-plt or NSAID) No    CHA2DS2-VASc Score = 6  The patient's score is based upon: CHF History: 0 HTN History: 1 Diabetes History: 0 Stroke History: 2 Vascular Disease History: 0 Age Score: 2 Gender Score: 1     Total time spent with patient today 45 minutes. This includes reviewing records, evaluating the patient and coordinating care.   Medication Adjustments/Labs and Tests Ordered: Current medicines are reviewed at length with the patient today.  Concerns regarding medicines are outlined above.   No orders of the defined types were placed in this encounter.  No orders of the defined types were placed in this encounter.  I,Mathew Stumpf,acting as a 96 for Neurosurgeon, MD.,have documented all relevant documentation on the behalf of Lanier Prude, MD,as directed by  Lanier Prude, MD while in the presence of Lanier Prude, MD.  I, Lanier Prude, MD, have reviewed all documentation for this visit. The documentation on 05/12/22 for the exam, diagnosis, procedures, and orders are all accurate and complete.   Signed, 13/02/23, MD, [facility], [facility] 05/12/2022 4:16 PM    Electrophysiology [facility]

## 2022-05-12 NOTE — Progress Notes (Deleted)
Electrophysiology Office Follow up Visit Note:    Date:  05/12/2022   ID:  Kim [name], DOB 1946/06/24, MRN [id number]  PCP:  Curt Jews, PA-C  [facility] Cardiologist:  None  [facility] Electrophysiologist:  Vickie Epley, MD    Interval History:    Kim [name] is a 76 y.o. female who presents for a follow up visit. They were last seen in clinic 03/23/2022 for watchman evaluation. She had a TEE after I saw her for LAA assessment.   She is not currently on [place] given anemia requiring transfusion. She has experienced multiple stroke events.     Past Medical History:  Diagnosis Date   AKI (acute kidney injury) ([place]) 11/18/2021   Arthritis    Atrial fibrillation ([place])    Depression    Essential hypertension 06/28/2021   Mixed hyperlipidemia 06/28/2021   Restless leg syndrome 06/28/2021   Stroke [facility])    x2    Past Surgical History:  Procedure Laterality Date   BIOPSY  11/27/2021   Procedure: BIOPSY;  Surgeon: Sharyn Creamer, MD;  Location: [place];  Service: Gastroenterology;;   Kathleen Argue STUDY  08/02/2021   Procedure: BUBBLE STUDY;  Surgeon: Jerline Pain, MD;  Location: [place] ENDOSCOPY;  Service: Cardiovascular;;   ESOPHAGOGASTRODUODENOSCOPY (EGD) WITH PROPOFOL N/A 11/27/2021   Procedure: ESOPHAGOGASTRODUODENOSCOPY (EGD) WITH PROPOFOL;  Surgeon: Sharyn Creamer, MD;  Location: [facility] ENDOSCOPY;  Service: Gastroenterology;  Laterality: N/A;   FLEXIBLE SIGMOIDOSCOPY N/A 11/21/2021   Procedure: FLEXIBLE SIGMOIDOSCOPY;  Surgeon: [name], MD;  Location: [facility] ENDOSCOPY;  Service: Gastroenterology;  Laterality: N/A;   LOOP RECORDER INSERTION N/A 08/02/2021   Procedure: LOOP RECORDER INSERTION;  Surgeon: Vickie Epley, MD;  Location: [place] CV LAB;  Service: Cardiovascular;  Laterality: N/A;   TEE WITHOUT CARDIOVERSION N/A 08/02/2021   Procedure: TRANSESOPHAGEAL ECHOCARDIOGRAM (TEE);  Surgeon: Jerline Pain, MD;  Location: [facility] ENDOSCOPY;  Service:  Cardiovascular;  Laterality: N/A;   TEE WITHOUT CARDIOVERSION N/A 04/05/2022   Procedure: TRANSESOPHAGEAL ECHOCARDIOGRAM (TEE);  Surgeon: Lelon Perla, MD;  Location: [facility] ENDOSCOPY;  Service: Cardiovascular;  Laterality: N/A;    Current Medications: No outpatient medications have been marked as taking for the 05/12/22 encounter (Appointment) with Vickie Epley, MD.     Allergies:   Beef-derived products, Beta adrenergic blockers, Bystolic [nebivolol hcl], Chicken protein, Fish-derived products, Neurontin [gabapentin], Norvasc [amlodipine], Other, Pork-derived products, Trandate [labetalol], and Zestril [lisinopril]   Social History   Socioeconomic History   Marital status: Married    Spouse name: Not on file   Number of children: Not on file   Years of education: Not on file   Highest education level: Not on file  Occupational History   Not on file  Tobacco Use   Smoking status: Never   Smokeless tobacco: Never   Tobacco comments:    Never smoke 12/13/21  Vaping Use   Vaping Use: Never used  Substance and Sexual Activity   Alcohol use: No   Drug use: No   Sexual activity: Not on file  Other Topics Concern   Not on file  Social History Narrative   Not on file   Social Determinants of Health   Financial Resource Strain: Not on file  Food Insecurity: Not on file  Transportation Needs: Not on file  Physical Activity: Not on file  Stress: Not on file  Social Connections: Not on file     Family History: The patient's family history is negative for  Heart disease.  ROS:   Please see the history of present illness.    All other systems reviewed and are negative.  EKGs/Labs/Other Studies Reviewed:    The following studies were reviewed today:  04/05/2022 TEE EF 55 RV normal LA dilated Small Peff Cauliflower LAA morphology; measurements (0 2.3 x 3.1 cm; 30 2.4 x 2.5  cm; 60 1.6 x 2.6 cm; 90 2 x 2.3 cm; 130 1.4 x 2.5 cm).     Recent Labs: 11/19/2021: ALT  12; TSH 2.465 11/24/2021: Magnesium 2.1 04/05/2022: BUN 33; Creatinine, Ser 1.51; Hemoglobin 9.3; Platelets 214; Potassium 4.0; Sodium 135  Recent Lipid Panel    Component Value Date/Time   CHOL 142 07/31/2021 0152   TRIG 49 07/31/2021 0152   HDL 55 07/31/2021 0152   CHOLHDL 2.6 07/31/2021 0152   VLDL 10 07/31/2021 0152   LDLCALC 77 07/31/2021 0152    Physical Exam:    VS:  There were no vitals taken for this visit.    Wt Readings from Last 3 Encounters:  04/05/22 105 lb (47.6 kg)  03/23/22 106 lb 9.6 oz (48.4 kg)  02/14/22 106 lb 9.6 oz (48.4 kg)     GEN: *** Well nourished, well developed in no acute distress HEENT: Normal NECK: No JVD; No carotid bruits LYMPHATICS: No lymphadenopathy CARDIAC: ***RRR, no murmurs, rubs, gallops RESPIRATORY:  Clear to auscultation without rales, wheezing or rhonchi  ABDOMEN: Soft, non-tender, non-distended MUSCULOSKELETAL:  No edema; No deformity  SKIN: Warm and dry NEUROLOGIC:  Alert and oriented x 3 PSYCHIATRIC:  Normal affect        ASSESSMENT:    1. Atrial fibrillation, chronic (HCC)   2. Chronic heart failure with preserved ejection fraction (HCC)   3. TIA (transient ischemic attack)    PLAN:    In order of problems listed above:  #Paroxysmal AF #TIA  I have seen Kim [name] in the office today who is being considered for a Watchman left atrial appendage closure device. I believe they will benefit from this procedure given their history of atrial fibrillation, CHA2DS2-VASc score of 6 and unadjusted ischemic stroke rate of 9.7% per year. Unfortunately, the patient is not felt to be a long term anticoagulation candidate secondary to anemia/GI bleeding. The patient's chart has been reviewed and I feel that they would be a candidate for short term oral anticoagulation after Watchman implant.    It is my belief that after undergoing a LAA closure procedure, Kim [name] will not need long term anticoagulation which eliminates  anticoagulation side effects and major bleeding risk.    Procedural risks for the Watchman implant have been reviewed with the patient including a 0.5% risk of stroke, <1% risk of perforation and <1% risk of device embolization. Other risks include bleeding, vascular damage, tamponade, worsening renal function, and death.      The published clinical data on the safety and effectiveness of WATCHMAN include but are not limited to the following: - Holmes DR, Everlene Farrier, Sick P et al. for the PROTECT AF Investigators. Percutaneous closure of the left atrial appendage versus warfarin therapy for prevention of stroke in patients with atrial fibrillation: a randomised non-inferiority trial. Lancet 2009; 374: 534-42. Everlene Farrier, Doshi SK, Isa Rankin D et al. on behalf of the PROTECT AF Investigators. Percutaneous Left Atrial Appendage Closure for Stroke Prophylaxis in Patients With Atrial Fibrillation 2.3-Year Follow-up of the PROTECT AF (Watchman Left Atrial Appendage System for Embolic Protection in Patients With Atrial Fibrillation) Trial.  Circulation 2013; 127:720-729. - Alli O, Doshi S,  Kar S, Reddy VY, Sievert H et al. Quality of Life Assessment in the Randomized PROTECT AF (Percutaneous Closure of the Left Atrial Appendage Versus Warfarin Therapy for Prevention of Stroke in Patients With Atrial Fibrillation) Trial of Patients at Risk for Stroke With Nonvalvular Atrial Fibrillation. J Am Coll Cardiol 2013; 61:1790-8. Aline August DR, [place]ia [place], Price M, Whisenant B, Sievert H, Doshi S, Huber K, Reddy V. Prospective randomized evaluation of the Watchman left atrial appendage Device in patients with atrial fibrillation versus long-term warfarin therapy; the PREVAIL trial. Journal of the [employer] of Cardiology, Vol. 4, No. 1, 2014, 1-11. - Kar S, Doshi SK, Sadhu A, Horton R, Osorio J et al. Primary outcome evaluation of a next-generation left atrial appendage closure device: results from the PINNACLE FLX  trial. Circulation 2021;143(18)1754-1762.      HAS-BLED score 5 Hypertension Yes  Abnormal renal and liver function (Dialysis, transplant, Cr >2.26 mg/dL /Cirrhosis or Bilirubin >2x Normal or AST/ALT/AP >3x Normal) Yes  Stroke Yes  Bleeding Yes  Labile INR (Unstable/high INR) No  Elderly (>65) Yes  Drugs or alcohol (? 8 drinks/week, anti-plt or NSAID) No    CHA2DS2-VASc Score = 6  The patient's score is based upon: CHF History: 0 HTN History: 1 Diabetes History: 0 Stroke History: 2 Vascular Disease History: 0 Age Score: 2 Gender Score: 1   Her daughter is a PCP Manpreet Budhan [phone]   Plan to implant with DAPT. She will start taking Aspirin 2 weeks before implant and plavix 1 week before implant.              Total time spent with patient today *** minutes. This includes reviewing records, evaluating the patient and coordinating care.   Medication Adjustments/Labs and Tests Ordered: Current medicines are reviewed at length with the patient today.  Concerns regarding medicines are outlined above.  No orders of the defined types were placed in this encounter.  No orders of the defined types were placed in this encounter.    Signed, Steffanie Dunn, MD, [facility], [facility] 05/12/2022 7:57 AM    Electrophysiology [facility]

## 2022-05-12 NOTE — Patient Instructions (Signed)
Medication Instructions:  Start Eliquis 2.5 mg two times a day  *If you need a refill on your cardiac medications before your next appointment, please call your pharmacy*   Lab Work: CBC in 14 days, 30 days, 60 days If you have labs (blood work) drawn today and your tests are completely normal, you will receive your results only by: [place] (if you have MyChart) OR A paper copy in the mail If you have any lab test that is abnormal or we need to change your treatment, we will call you to review the results.   Testing/Procedures: None    Follow-Up: At [facility], you and your health needs are our priority.  As part of our continuing mission to provide you with exceptional heart care, we have created designated Provider Care Teams.  These Care Teams include your primary Cardiologist (physician) and Advanced Practice Providers (APPs -  Physician Assistants and Nurse Practitioners) who all work together to provide you with the care you need, when you need it.  We recommend signing up for the patient portal called "MyChart".  Sign up information is provided on this After Visit Summary.  MyChart is used to connect with patients for Virtual Visits (Telemedicine).  Patients are able to view lab/test results, encounter notes, upcoming appointments, etc.  Non-urgent messages can be sent to your provider as well.   To learn more about what you can do with MyChart, go to [email or web address].    Your next appointment:   3 month(s)  The format for your next appointment:   In Person  Provider:   You will see one of the following Advanced Practice Providers on your designated Care Team:   Tommye Standard, [place] Legrand Como "Jonni Sanger" Chalmers Cater, [place]      Other Instructions None   Important Information About Sugar

## 2022-05-26 ENCOUNTER — Ambulatory Visit: Payer: Medicare Other | Attending: Cardiology

## 2022-05-26 DIAGNOSIS — I5032 Chronic diastolic (congestive) heart failure: Secondary | ICD-10-CM

## 2022-05-26 DIAGNOSIS — I482 Chronic atrial fibrillation, unspecified: Secondary | ICD-10-CM

## 2022-05-26 DIAGNOSIS — G459 Transient cerebral ischemic attack, unspecified: Secondary | ICD-10-CM

## 2022-05-26 LAB — CBC
Hematocrit: 26.1 % — ABNORMAL LOW (ref 34.0–46.6)
Hemoglobin: 8.8 g/dL — ABNORMAL LOW (ref 11.1–15.9)
MCH: 31.1 pg (ref 26.6–33.0)
MCHC: 33.7 g/dL (ref 31.5–35.7)
MCV: 92 fL (ref 79–97)
Platelets: 222 10*3/uL (ref 150–450)
RBC: 2.83 x10E6/uL — ABNORMAL LOW (ref 3.77–5.28)
RDW: 12.4 % (ref 11.7–15.4)
WBC: 4 10*3/uL (ref 3.4–10.8)

## 2022-05-30 ENCOUNTER — Telehealth: Payer: Self-pay

## 2022-05-30 NOTE — Telephone Encounter (Signed)
Pt called to follow up with CBC lab results;   Hemoglobin stable. No changes in medications for now. Please inform patient.   Sheria Lang T. Lalla Brothers, MD, [facility], [facility] Cardiac Electrophysiology  Information above was shared with Pt daughter, and HGB value.  Pt daughter understood results, and will communicate to her father.

## 2022-05-30 NOTE — Telephone Encounter (Signed)
-----   Message from Lanier Prude, MD sent at 05/27/2022  9:45 PM EST ----- Hemoglobin stable. No changes in medications for now. Please inform patient.  Sheria Lang T. Lalla Brothers, MD, [facility], [facility] Cardiac Electrophysiology

## 2022-06-09 ENCOUNTER — Encounter: Payer: Self-pay | Admitting: Neurology

## 2022-06-09 ENCOUNTER — Telehealth: Payer: Self-pay | Admitting: Cardiology

## 2022-06-09 ENCOUNTER — Ambulatory Visit (INDEPENDENT_AMBULATORY_CARE_PROVIDER_SITE_OTHER): Payer: Medicare Other | Admitting: Neurology

## 2022-06-09 VITALS — BP 188/75 | HR 56 | Ht 59.0 in | Wt 106.2 lb

## 2022-06-09 DIAGNOSIS — Z8673 Personal history of transient ischemic attack (TIA), and cerebral infarction without residual deficits: Secondary | ICD-10-CM | POA: Diagnosis not present

## 2022-06-09 DIAGNOSIS — I480 Paroxysmal atrial fibrillation: Secondary | ICD-10-CM | POA: Diagnosis not present

## 2022-06-09 DIAGNOSIS — I639 Cerebral infarction, unspecified: Secondary | ICD-10-CM

## 2022-06-09 NOTE — Patient Instructions (Addendum)
I had a long d/w patient and her daughter about her remote stroke, recent diagnosis of paroxysmal A-fib on loop recorder , hospitalization and renal failure and anemia, risk for recurrent stroke/TIAs, personally independently reviewed imaging studies and stroke evaluation results and answered questions.Continue to low dose Eliquis 2.5 mg twice daily for now given advanced age, recent renal failure and anemia but if she has bleeding episodes or worsening anemia may consider Watchman device later. Maintain strict control of hypertension with blood pressure goal below 130/90, diabetes with hemoglobin A1c goal below 6.5% and lipids with LDL cholesterol goal below 70 mg/dL. I also advised the patient to eat a healthy diet with plenty of whole grains, cereals, fruits and vegetables, exercise regularly and maintain ideal body weight .continue follow-up with nephrology for her renal failure.  I advised her to use her walker at all times and we discussed fall prevention precautions as well.  Followup in the future with me in 1 year or call earlier if necessary.  Fall Prevention in the Home, Adult Falls can cause injuries and affect people of all ages. There are many simple things that you can do to make your home safe and to help prevent falls. Ask for help when making these changes, if needed. What actions can I take to prevent falls? General instructions Use good lighting in all rooms. Replace any light bulbs that burn out, turn on lights if it is dark, and use night-lights. Place frequently used items in easy-to-reach places. Lower the shelves around your home if necessary. Set up furniture so that there are clear paths around it. Avoid moving your furniture around. Remove throw rugs and other tripping hazards from the floor. Avoid walking on wet floors. Fix any uneven floor surfaces. Add color or contrast paint or tape to grab bars and handrails in your home. Place contrasting color strips on the first and  last steps of staircases. When you use a stepladder, make sure that it is completely opened and that the sides and supports are firmly locked. Have someone hold the ladder while you are using it. Do not climb a closed stepladder. Know where your pets are when moving through your home. What can I do in the bathroom?     Keep the floor dry. Immediately clean up any water that is on the floor. Remove soap buildup in the tub or shower regularly. Use nonskid mats or decals on the floor of the tub or shower. Attach bath mats securely with double-sided, nonslip rug tape. If you need to sit down while you are in the shower, use a plastic, nonslip stool. Install grab bars by the toilet and in the tub and shower. Do not use towel bars as grab bars. What can I do in the bedroom? Make sure that a bedside light is easy to reach. Do not use oversized bedding that reaches the floor. Have a firm chair that has side arms to use for getting dressed. What can I do in the kitchen? Clean up any spills right away. If you need to reach for something above you, use a sturdy step stool that has a grab bar. Keep electrical cables out of the way. Do not use floor polish or wax that makes floors slippery. If you must use wax, make sure that it is non-skid floor wax. What can I do with my stairs? Do not leave any items on the stairs. Make sure that you have a light switch at the top and the bottom of  the stairs. Have them installed if you do not have them. Make sure that there are handrails on both sides of the stairs. Fix handrails that are broken or loose. Make sure that handrails are as long as the staircases. Install non-slip stair treads on all stairs in your home. Avoid having throw rugs at the top or bottom of stairs, or secure the rugs with carpet tape to prevent them from moving. Choose a carpet design that does not hide the edge of steps on the stairs. Check any carpeting to make sure that it is firmly  attached to the stairs. Fix any carpet that is loose or worn. What can I do on the outside of my home? Use bright outdoor lighting. Regularly repair the edges of walkways and driveways and fix any cracks. Remove high doorway thresholds. Trim any shrubbery on the main path into your home. Regularly check that handrails are securely fastened and in good repair. Both sides of all steps should have handrails. Install guardrails along the edges of any raised decks or porches. Clear walkways of debris and clutter, including tools and rocks. Have leaves, snow, and ice cleared regularly. Use sand or salt on walkways during winter months. In the garage, clean up any spills right away, including grease or oil spills. What other actions can I take? Wear closed-toe shoes that fit well and support your feet. Wear shoes that have rubber soles or low heels. Use mobility aids as needed, such as canes, walkers, scooters, and crutches. Review your medicines with your health care provider. Some medicines can cause dizziness or changes in blood pressure, which increase your risk of falling. Talk with your health care provider about other ways that you can decrease your risk of falls. This may include working with a physical therapist or trainer to improve your strength, balance, and endurance. Where to find more information Centers for Disease Control and Prevention, [employer]: FootballExhibition.com.br [employer] on Aging: https://walker.com/ Contact a health care provider if: You are afraid of falling at home. You feel weak, drowsy, or dizzy at home. You fall at home. Summary There are many simple things that you can do to make your home safe and to help prevent falls. Ways to make your home safe include removing tripping hazards and installing grab bars in the bathroom. Ask for help when making these changes in your home. This information is not intended to replace advice given to you by your health care provider.  Make sure you discuss any questions you have with your health care provider. Document Revised: 03/29/2021 Document Reviewed: 01/29/2020 Elsevier Patient Education  2023 Elsevier Inc. , hospitalization and renal failure and anemia, risk for recurrent stroke/TIAs, personally independently reviewed imaging studies and stroke evaluation results and answered questions.Continue low-dose Eliquis 2.5 mg twice daily for but if she has worsening of anemia or further bleeding may need to consider the Watchman device.. Maintain strict control of hypertension with blood pressure goal below 130/90, diabetes with hemoglobin A1c goal below 6.5% and lipids with LDL cholesterol goal below 70 mg/dL. I also advised the patient to eat a healthy diet with plenty of whole grains, cereals, fruits and vegetables, exercise regularly and maintain ideal body weight .continue follow-up with nephrology for her renal failure.  She was encouraged to use her walker at all times and we did Fall safety precautions.  Followup in the future with me in 1 year or call earlier if necessary.

## 2022-06-09 NOTE — Telephone Encounter (Signed)
Called patient's daughter, informed her that patient is suppose to get BMET, 15 days, 30 days, and then 60 days from the start of taking eliquis, that was on 05/14/22. Informed her that patient is scheduled right.

## 2022-06-09 NOTE — Progress Notes (Signed)
[facility] [address] [place]. [place] [address] [phone]       OFFICE FOLLOW-UP NOTE  [name] Care Date of Birth:  [birth year] Medical Record Number:  [id number]   HPI: Initial visit 09/01/2021 Kim [name] is a 76 year old pleasant [place] Asian [place] origin lady who is seen for first office follow-up visit following stroke.  She is accompanied by her daughter.  History is obtained from her and review of electronic medical records and I personally reviewed available imaging films in PACS.  She has past medical history of SLE, hypertension, hyperlipidemia, GERD, restless leg syndrome and osteoporosis.  She presented on 06/26/2021 with sudden onset of dizziness nausea and vomiting and lethargy.  She felt like she was walking like a drunk.  There is no accompanying diplopia headache focal weakness or numbness.  Neurological exam showed slow saccades on left lateral gaze, truncal ataxia but normal strength sensation and coordination.  CT scan was unremarkable.  CT angiogram showed 2 mm outpouching from right ICA terminus moderate calcified stenosis in the left M2 and moderate narrowing of the left P2.  2D echo showed ejection fraction of 60 to 65% with moderate left ventricular hypertrophy.  LDL cholesterol 73 mg percent hemoglobin A1c 5.9.  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  Stroke etiology was felt to be cryptogenic in recommended loop recorder and started on aspirin 81 and Plavix 75 for 3 weeks and inpatient rehab.  She however returned on 07/31/2021 with transient episode of right sided weakness and facial droop lasting 15 minutes.  CT head was negative for acute abnormality and CT angiogram showed stable appearance of previously seen 40% stenosis of proximal right ICA and left M2 calcified stenosis and left P2 stenosis.  MRI showed 1.5 cm acute infarct in the left parietal lobe and showed the  previously seen remote hemorrhagic left superior cerebellar infarct.  2D echo was unremarkable TEE was obtained which showed no evidence of marantic endocarditis or clot but did show small PFO.  She had loop recorder placed during this admission.  Anticardiolipin antibodies were also checked which were negative.  LDL cholesterol 77 mg percent hemoglobin A1c 5.9.  She had previously been on aspirin 81 mg and the dose was increased to 325 mg and Plavix 75 mg was continued for up planned duration of 3 months followed by aspirin alone given left M2 stenosis.  Patient has done well since discharge.  Her right-sided weakness has not returned.  She continues to have mild gait and balance difficulties but can ambulate fairly well using a cane and walker.  She has had no falls or injuries.  She does have some increased bruising since aspirin dose was increased.  She is still getting physical and occupational therapy which is now down to once a week or so.  She is able to get up and walk around and handle her own affairs and can be left alone for several hours.  She has no new complaints today.  Loop recorder interrogation so far has not shown paroxysmal A-fib Update 12/07/2021 : She returns for follow-up after last visit 3 months ago.  She is accompanied by her daughter who provides most of the history.  Patient was hospitalized from 5/11 to 11/30/2020 with fecal impaction and proctitis as well as renal failure and dehydration.  She had been diagnosed with paroxysmal A-fib on loop recorder on 11/09/2021 and had been on Eliquis which was  held due to anemia and renal failure.  She had lab work last week as well which still showed persistent serum creatinine of 2.78 and hematocrit of 26 requiring blood transfusion during the hospitalization and she has a follow-up appointment with nephrologist in a few weeks decide on further course of action.  She has had no recurrent stroke or TIA symptoms.  Blood pressure slightly elevated  today at 149/76 but better at home.  She is on Norvasc encourage.  She is tolerating Crestor well without muscle aches and pains.  She has not had any recurrent stroke or TIA symptoms.  He has no new neurological complaints.  She is ambulating with a walker with a cautious gait and has had no falls or injuries. Update 06/09/2022 : She returns for follow-up after last visit 6 months ago.  She is accompanied by her daughter.  Patient continues to have mild anemia and renal efficiency has poor appetite.  She is ambulating with a walker and pretty stable.  She has had no falls or injuries.  He was seen by cardiology Dr. Lalla Brothers recently started her back on Eliquis 2.5 twice daily with plan to consider Watchman device in case she has bleeding.  Patient ran out of her blood pressure medications few days ago and blood pressure is elevated today at 188/75 and Norvasc was just refilled by primary care physician and she took her first dose today for 3 to 4 days.  He has had no recurrent stroke or TIA symptoms.  He is tolerating Crestor well without muscle aches and pains.  Tramadol and Requip for her restless leg at night which seems well controlled.  She has no new complaints. ROS:   14 system review of systems is positive for balance difficulties, walking difficulties , renal failure all other systems negative  PMH:  Past Medical History:  Diagnosis Date   AKI (acute kidney injury) (HCC) 11/18/2021   Arthritis    Atrial fibrillation (HCC)    Depression    Essential hypertension 06/28/2021   Mixed hyperlipidemia 06/28/2021   Restless leg syndrome 06/28/2021   Stroke [facility])    x2    Social History:  Social History   Socioeconomic History   Marital status: Married    Spouse name: Not on file   Number of children: Not on file   Years of education: Not on file   Highest education level: Not on file  Occupational History   Not on file  Tobacco Use   Smoking status: Never   Smokeless tobacco: Never   Vaping Use   Vaping Use: Never used  Substance and Sexual Activity   Alcohol use: No   Drug use: No   Sexual activity: Not on file  Other Topics Concern   Not on file  Social History Narrative   Not on file   Social Determinants of Health   Financial Resource Strain: Not on file  Food Insecurity: Not on file  Transportation Needs: Not on file  Physical Activity: Not on file  Stress: Not on file  Social Connections: Not on file  Intimate Partner Violence: Not on file    Medications:   Current Outpatient Medications on File Prior to Visit  Medication Sig Dispense Refill   amLODipine (NORVASC) 5 MG tablet Take 1 tablet (5 mg total) by mouth daily. 30 tablet 2   carvedilol (COREG) 3.125 MG tablet Take 1 tablet (3.125 mg total) by mouth 2 (two) times daily with a meal. 60 tablet 2  clotrimazole-betamethasone (LOTRISONE) cream Apply 1 application. topically as needed (rash).     famotidine (PEPCID) 20 MG tablet Take 20 mg by mouth daily.     fluconazole (DIFLUCAN) 100 MG tablet Take 1 tablet (100 mg total) by mouth daily for 10 days. 10 tablet 0   hydrALAZINE (APRESOLINE) 50 MG tablet Take 50 mg by mouth in the morning and at bedtime.     hydroxychloroquine (PLAQUENIL) 200 MG tablet Take 200 mg by mouth daily.     isosorbide mononitrate (IMDUR) 60 MG 24 hr tablet Take 1 tablet (60 mg total) by mouth daily. 30 tablet 2   magnesium oxide (MAG-OX) 400 MG tablet Take 400 mg by mouth every evening.     pantoprazole (PROTONIX) 40 MG tablet Take 1 tablet (40 mg total) by mouth 2 (two) times daily. 60 tablet 1   polyethylene glycol (MIRALAX / GLYCOLAX) 17 g packet Take 17 g by mouth daily as needed for mild constipation. 14 each 0   rOPINIRole (REQUIP) 1 MG tablet Take 1 mg by mouth at bedtime.     rosuvastatin (CRESTOR) 40 MG tablet Take 1 tablet (40 mg total) by mouth daily. 30 tablet 0   senna-docusate (SENOKOT-S) 8.6-50 MG tablet Take 1 tablet by mouth every morning.      senna-docusate (SENOKOT-S) 8.6-50 MG tablet Take 2 tablets by mouth at bedtime. 60 tablet 2   sodium bicarbonate 650 MG tablet Take 1 tablet (650 mg total) by mouth daily. 30 tablet 2   Sodium Phosphates (FLEET ENEMA RE) Place 1 enema rectally daily as needed (constipation).     traMADol (ULTRAM) 50 MG tablet Take 25 mg by mouth at bedtime as needed for moderate pain.     Vitamin D, Ergocalciferol, (DRISDOL) 1.25 MG (50000 UNIT) CAPS capsule Take 50,000 Units by mouth every Thursday.     No current facility-administered medications on file prior to visit.    Allergies:   Allergies  Allergen Reactions   Beef-Derived Products Other (See Comments)    Vegetarian  Does not eat due to religious reasons   Beta Adrenergic Blockers Other (See Comments)    Bradycardia   Bystolic [Nebivolol Hcl] Other (See Comments)    Bradycardia    Chicken Protein Other (See Comments)    Vegetarian Does not eat due to religious reasons   Fish-Derived Products Other (See Comments)    Vegetarian  Does not eat due to religious reasons   Neurontin [Gabapentin] Other (See Comments)    Unknown reaction    Norvasc [Amlodipine] Swelling   Pork-Derived Products Other (See Comments)    Vegetarian (pt accepts heparin products 07/31/21) Does not eat due to religious reasons   Trandate [Labetalol] Other (See Comments)    Bradycardia   Zestril [Lisinopril] Other (See Comments)    Cough     Physical Exam General: Frail elderly [place] Asian lady seated, in no evident distress Head: head normocephalic and atraumatic.  Neck: supple with no carotid or supraclavicular bruits Cardiovascular: regular rate and rhythm, no murmurs Musculoskeletal: no deformity except mild kyphoscoliosis. Skin:  no rash/petichiae Vascular:  Normal pulses all extremities Vitals:   12/07/21 1048  BP: (!) 149/76  Pulse: (!) 57   Neurologic Exam Mental Status: Awake and fully alert. Oriented to place and time. Recent and remote memory  intact. Attention span, concentration and fund of knowledge appropriate. Mood and affect appropriate.  Cranial Nerves: Fundoscopic exam not done. Pupils equal, briskly reactive to light. Extraocular movements full without nystagmus. Visual  fields full to confrontation. Hearing intact. Facial sensation intact. Face, tongue, palate moves normally and symmetrically.  Motor: Normal bulk and tone. Normal strength in all tested extremity muscles. Sensory.: intact to touch ,pinprick .position and vibratory sensation.  Coordination: Rapid alternating movements normal in all extremities. Finger-to-nose and heel-to-shin performed accurately bilaterally. Gait and Station: Arises from chair without difficulty. Stance is slightly stooped.  Uses a wheeled walker.. Gait slow and cautious but steady.  Unable to heel, toe and tandem walk without difficulty.  Reflexes: 1+ and symmetric. Toes downgoing.   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  Vascular risk factors of hypertension, hyperlipidemia and intracranial stenosis.  Recent diagnosis of paroxysmal A-fib on loop recorder on 11/09/2021 and has been restarted on low-dose Eliquis due to her advanced age ,t acute renal failure and anemia requiring blood transfusion.     PLAN: I had a long d/w patient and her daughter about her remote stroke, recent diagnosis of paroxysmal A-fib on loop recorder , hospitalization and renal failure and anemia, risk for recurrent stroke/TIAs, personally independently reviewed imaging studies and stroke evaluation results and answered questions.Continue to low dose Eliquis 2.5 mg twice daily for now given advanced age, recent renal failure and anemia but if she has bleeding episodes or worsening  anemia may consider Watchman device later. Maintain strict control of hypertension with blood pressure goal below 130/90, diabetes with hemoglobin A1c goal below 6.5% and lipids with LDL cholesterol goal below 70 mg/dL. I also advised the patient to eat a healthy diet with plenty of whole grains, cereals, fruits and vegetables, exercise regularly and maintain ideal body weight .continue follow-up with nephrology for her renal failure.  I advised her to use her walker at all times and we discussed fall prevention precautions as well.  Followup in the future with me in 1 year or call earlier if necessary. Greater than 50% of time during this 35 minute visit was spent on counseling,explanation of diagnosis of paroxysmal atrial fibrillation , embolic stroke,, recent renal failure and anemia and risk benefit of anticoagulation and stroke risk and intracranial stenosis, planning of further management, discussion with patient and family and coordination of care Delia Heady, MD Note: This document was prepared with digital dictation and possible smart phrase technology. Any transcriptional errors that result from this process are unintentional

## 2022-06-09 NOTE — Telephone Encounter (Signed)
  Pt's daughter wants to confirm the labs appt for the pt. She is confused if the 30 days from 11/02 when pt started her new medication or it should be 30 days from 11/14 when pt got her last labs appt

## 2022-06-11 NOTE — Progress Notes (Signed)
Carelink Summary Report / Loop Recorder 

## 2022-06-13 ENCOUNTER — Ambulatory Visit: Payer: Medicare Other | Attending: Cardiology

## 2022-06-13 ENCOUNTER — Ambulatory Visit (INDEPENDENT_AMBULATORY_CARE_PROVIDER_SITE_OTHER): Payer: Medicare Other

## 2022-06-13 DIAGNOSIS — I482 Chronic atrial fibrillation, unspecified: Secondary | ICD-10-CM | POA: Diagnosis not present

## 2022-06-13 DIAGNOSIS — G459 Transient cerebral ischemic attack, unspecified: Secondary | ICD-10-CM

## 2022-06-13 DIAGNOSIS — I5032 Chronic diastolic (congestive) heart failure: Secondary | ICD-10-CM

## 2022-06-13 LAB — CUP PACEART REMOTE DEVICE CHECK
Date Time Interrogation Session: 20231203230724
Implantable Pulse Generator Implant Date: 20230123

## 2022-06-13 LAB — CBC
Hematocrit: 27.1 % — ABNORMAL LOW (ref 34.0–46.6)
Hemoglobin: 9.1 g/dL — ABNORMAL LOW (ref 11.1–15.9)
MCH: 31.6 pg (ref 26.6–33.0)
MCHC: 33.6 g/dL (ref 31.5–35.7)
MCV: 94 fL (ref 79–97)
Platelets: 227 10*3/uL (ref 150–450)
RBC: 2.88 x10E6/uL — ABNORMAL LOW (ref 3.77–5.28)
RDW: 13.2 % (ref 11.7–15.4)
WBC: 4.1 10*3/uL (ref 3.4–10.8)

## 2022-06-20 ENCOUNTER — Telehealth: Payer: Self-pay

## 2022-06-20 NOTE — Telephone Encounter (Signed)
Obtained assistance from interpreter line # [id number].  Call went to voicemail and per EPIC ok to leave detailed message.  Interpreter left the following message . Per Dr Lalla Brothers, Hemoglobin remains stable. This is good news. Continue with the current medications.  Call [phone] for any further questions.

## 2022-06-20 NOTE — Telephone Encounter (Signed)
-----   Message from Lanier Prude, MD sent at 06/19/2022  9:00 PM EST ----- Hemoglobin remains stable. This is good news. Continue with the current medication regimen.   Triage, please notify patient.  Sheria Lang T. Lalla Brothers, MD, [facility], [facility] Cardiac Electrophysiology

## 2022-07-12 ENCOUNTER — Ambulatory Visit: Payer: Medicare Other | Attending: Cardiology

## 2022-07-12 DIAGNOSIS — I482 Chronic atrial fibrillation, unspecified: Secondary | ICD-10-CM

## 2022-07-12 DIAGNOSIS — G459 Transient cerebral ischemic attack, unspecified: Secondary | ICD-10-CM

## 2022-07-12 DIAGNOSIS — I5032 Chronic diastolic (congestive) heart failure: Secondary | ICD-10-CM

## 2022-07-13 LAB — CBC
Hematocrit: 29.4 % — ABNORMAL LOW (ref 34.0–46.6)
Hemoglobin: 9.9 g/dL — ABNORMAL LOW (ref 11.1–15.9)
MCH: 31.6 pg (ref 26.6–33.0)
MCHC: 33.7 g/dL (ref 31.5–35.7)
MCV: 94 fL (ref 79–97)
Platelets: 215 10*3/uL (ref 150–450)
RBC: 3.13 x10E6/uL — ABNORMAL LOW (ref 3.77–5.28)
RDW: 11.8 % (ref 11.7–15.4)
WBC: 4.1 10*3/uL (ref 3.4–10.8)

## 2022-07-18 ENCOUNTER — Ambulatory Visit: Payer: Medicare Other

## 2022-07-18 ENCOUNTER — Ambulatory Visit (INDEPENDENT_AMBULATORY_CARE_PROVIDER_SITE_OTHER): Payer: Medicare Other

## 2022-07-18 DIAGNOSIS — I482 Chronic atrial fibrillation, unspecified: Secondary | ICD-10-CM

## 2022-07-19 LAB — CUP PACEART REMOTE DEVICE CHECK
Date Time Interrogation Session: 20240107230821
Implantable Pulse Generator Implant Date: 20230123

## 2022-07-21 NOTE — Progress Notes (Signed)
Carelink Summary Report / Loop Recorder 

## 2022-08-16 NOTE — Progress Notes (Unsigned)
Electrophysiology Office Follow up Visit Note:    Date:  08/17/2022   ID:  Kim [name], DOB Sep 17, 1945, MRN [id number]  PCP:  Curt Jews, PA-C  [facility] Cardiologist:  None  [facility] Electrophysiologist:  Vickie Epley, MD    Interval History:    Kim [name] is a 77 y.o. female who presents for a follow up visit.   I last saw her 05/12/2022 for her AF. My initial visit was to consider [place] but that patient preferred to avoid invasive procedures. We previously discussed anticoagulation and started half-dose eliquis at the last appointment with close monitoring of her Hgb given a history of anemia. She initially did well on [place].   She saw Dr Leonie Man 06/09/2022 for follow up. At that visit reported no recurrent TIA/CVA symptoms. Her BP was out of control due to her running out of her medications. At that appt, Dr Leonie Man recommended continuing the low dose eliquis.  She then saw Dr Minna Merritts 06/24/2022 for consult for her AF. At that appointment, Dr Minna Merritts recommended continuing the low dose Eliquis.  She was admitted 07/21/2022 to [facility] with a left cerebellar intraparenchymal hemorrhage. Her BP was again significantly elevated during that hospitalization. She remained asymptomatic based on hospital notes.   Today she is here with her daughter in person and her other daughter via teleconference who is primary care physician out of state.  We reviewed her recent hospitalization.  She is no longer taking anticoagulation.       Past Medical History:  Diagnosis Date   AKI (acute kidney injury) ([place]) 11/18/2021   Arthritis    Atrial fibrillation ([place])    Depression    Essential hypertension 06/28/2021   Mixed hyperlipidemia 06/28/2021   Restless leg syndrome 06/28/2021   Stroke [facility])    x2    Past Surgical History:  Procedure Laterality Date   BIOPSY  11/27/2021   Procedure: BIOPSY;  Surgeon: Sharyn Creamer, MD;  Location: [place];  Service: Gastroenterology;;    Kathleen Argue STUDY  08/02/2021   Procedure: BUBBLE STUDY;  Surgeon: Jerline Pain, MD;  Location: [place] ENDOSCOPY;  Service: Cardiovascular;;   ESOPHAGOGASTRODUODENOSCOPY (EGD) WITH PROPOFOL N/A 11/27/2021   Procedure: ESOPHAGOGASTRODUODENOSCOPY (EGD) WITH PROPOFOL;  Surgeon: Sharyn Creamer, MD;  Location: [facility] ENDOSCOPY;  Service: Gastroenterology;  Laterality: N/A;   FLEXIBLE SIGMOIDOSCOPY N/A 11/21/2021   Procedure: FLEXIBLE SIGMOIDOSCOPY;  Surgeon: [name], MD;  Location: [facility] ENDOSCOPY;  Service: Gastroenterology;  Laterality: N/A;   LOOP RECORDER INSERTION N/A 08/02/2021   Procedure: LOOP RECORDER INSERTION;  Surgeon: Vickie Epley, MD;  Location: [place] CV LAB;  Service: Cardiovascular;  Laterality: N/A;   TEE WITHOUT CARDIOVERSION N/A 08/02/2021   Procedure: TRANSESOPHAGEAL ECHOCARDIOGRAM (TEE);  Surgeon: Jerline Pain, MD;  [facility];  Service: Cardiovascular;  Laterality: N/A;   TEE WITHOUT CARDIOVERSION N/A 04/05/2022   Procedure: TRANSESOPHAGEAL ECHOCARDIOGRAM (TEE);  Surgeon: Lelon Perla, MD;  Location: [facility] ENDOSCOPY;  Service: Cardiovascular;  Laterality: N/A;    Current Medications: Current Meds  Medication Sig   amLODipine (NORVASC) 10 MG tablet Take 1 tablet by mouth daily.   apixaban (ELIQUIS) 2.5 MG TABS tablet Take 1 tablet (2.5 mg total) by mouth 2 (two) times daily.   clotrimazole-betamethasone (LOTRISONE) cream Apply 1 application. topically as needed (rash).   hydrALAZINE (APRESOLINE) 50 MG tablet Take 50 mg by mouth in the morning and at bedtime.   hydroxychloroquine (PLAQUENIL) 200 MG tablet Take 200 mg by mouth  daily.   losartan (COZAAR) 50 MG tablet Take 1 tablet by mouth daily.   magnesium oxide (MAG-OX) 400 MG tablet Take 400 mg by mouth every evening.   ondansetron (ZOFRAN) 4 MG tablet Take 4 mg by mouth every 8 (eight) hours as needed for nausea or vomiting.   pantoprazole (PROTONIX) 40 MG tablet Take 40 mg by mouth daily.   polyethylene  glycol (MIRALAX / GLYCOLAX) 17 g packet Take 17 g by mouth daily as needed for mild constipation. (Patient taking differently: Take 17 g by mouth daily.)   rOPINIRole (REQUIP) 1 MG tablet Take 1 mg by mouth at bedtime.   rosuvastatin (CRESTOR) 5 MG tablet Take 1 tablet by mouth daily.   senna-docusate (SENOKOT-S) 8.6-50 MG tablet Take 1 tablet by mouth every morning.   sodium bicarbonate 650 MG tablet Take 650 mg by mouth daily.   traMADol (ULTRAM) 50 MG tablet Take 25 mg by mouth daily.   [DISCONTINUED] rosuvastatin (CRESTOR) 40 MG tablet Take 1 tablet (40 mg total) by mouth daily.     Allergies:   Beef-derived products, Beta adrenergic blockers, Bystolic [nebivolol hcl], Chicken protein, Fish-derived products, Neurontin [gabapentin], Norvasc [amlodipine], Other, Pork-derived products, Trandate [labetalol], and Zestril [lisinopril]   Social History   Socioeconomic History   Marital status: Married    Spouse name: Not on file   Number of children: Not on file   Years of education: Not on file   Highest education level: Not on file  Occupational History   Not on file  Tobacco Use   Smoking status: Never   Smokeless tobacco: Never   Tobacco comments:    Never smoke 12/13/21  Vaping Use   Vaping Use: Never used  Substance and Sexual Activity   Alcohol use: No   Drug use: No   Sexual activity: Not on file  Other Topics Concern   Not on file  Social History Narrative   Not on file   Social Determinants of Health   Financial Resource Strain: Not on file  Food Insecurity: Not on file  Transportation Needs: Not on file  Physical Activity: Not on file  Stress: Not on file  Social Connections: Not on file     Family History: The patient's family history is negative for Heart disease.  ROS:   Please see the history of present illness.    All other systems reviewed and are negative.  EKGs/Labs/Other Studies Reviewed:    The following studies were reviewed  today:   Recent Labs: 11/19/2021: ALT 12; TSH 2.465 11/24/2021: Magnesium 2.1 04/05/2022: BUN 33; Creatinine, Ser 1.51; Potassium 4.0; Sodium 135 07/12/2022: Hemoglobin 9.9; Platelets 215  Recent Lipid Panel    Component Value Date/Time   CHOL 142 07/31/2021 0152   TRIG 49 07/31/2021 0152   HDL 55 07/31/2021 0152   CHOLHDL 2.6 07/31/2021 0152   VLDL 10 07/31/2021 0152   LDLCALC 77 07/31/2021 0152    Physical Exam:    VS:  BP 118/60   Pulse (!) 56   Ht 4\' 9"  (1.448 m)   Wt 98 lb 12.8 oz (44.8 kg)   SpO2 98%   BMI 21.38 kg/m     Wt Readings from Last 3 Encounters:  08/17/22 98 lb 12.8 oz (44.8 kg)  06/09/22 106 lb 3.2 oz (48.2 kg)  05/12/22 105 lb (47.6 kg)     GEN:  Well nourished, well developed in no acute distress CARDIAC: RRR, no murmurs, rubs, gallops RESPIRATORY:  Clear to auscultation  without rales, wheezing or rhonchi        ASSESSMENT:    1. Atrial fibrillation, chronic ([place])   2. Chronic heart failure with preserved ejection fraction ([place])   3. Intracranial hemorrhage (HCC)    PLAN:    In order of problems listed above:  #Atrial fibrillation #Hx of intracranial hemorrhage Low burden of atrial fibrillation by loop recorder monitoring.  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   Long discussion with the patient's family during today's visit regarding the pros and cons of restarting anticoagulation.  Given her medical history, frailty, recent hemorrhagic stroke, history of falls, I do not think she is a good candidate for restarting anticoagulation.  We discussed how she is at an increased risk of ischemic stroke while off anticoagulation.  I will plan to see her back in 3 months to see how she is doing and to review upcoming appointments with consultants.     Medication Adjustments/Labs and Tests Ordered: Current medicines are reviewed at length with the patient today.  Concerns regarding medicines are outlined above.  No  orders of the defined types were placed in this encounter.  No orders of the defined types were placed in this encounter.    Signed, Lars Mage, MD, [facility], [facility] 08/17/2022 4:38 PM    Electrophysiology [facility]

## 2022-08-17 ENCOUNTER — Encounter: Payer: Self-pay | Admitting: Cardiology

## 2022-08-17 ENCOUNTER — Ambulatory Visit: Payer: Medicare Other | Attending: Cardiology | Admitting: Cardiology

## 2022-08-17 VITALS — BP 118/60 | HR 56 | Ht <= 58 in | Wt 98.8 lb

## 2022-08-17 DIAGNOSIS — I5032 Chronic diastolic (congestive) heart failure: Secondary | ICD-10-CM | POA: Diagnosis present

## 2022-08-17 DIAGNOSIS — I482 Chronic atrial fibrillation, unspecified: Secondary | ICD-10-CM | POA: Insufficient documentation

## 2022-08-17 DIAGNOSIS — I629 Nontraumatic intracranial hemorrhage, unspecified: Secondary | ICD-10-CM | POA: Diagnosis present

## 2022-08-17 NOTE — Progress Notes (Signed)
Electrophysiology Office Follow up Visit Note:    Date:  08/17/2022   ID:  Kim [name], DOB 05/18/1946, MRN [id number]  PCP:  Kim Jews, PA-C  [facility] Cardiologist:  None  [facility] Electrophysiologist:  Vickie Epley, MD    Interval History:    Kim [name] is a 77 y.o. female who presents for a follow up visit.   I last saw her 05/12/2022 for her AF. My initial visit was to consider [place] but that patient preferred to avoid invasive procedures. We previously discussed anticoagulation and started half-dose eliquis at the last appointment with close monitoring of her Hgb given a history of anemia. She initially did well on [place].   She saw Dr Kim [name] 06/09/2022 for follow up. At that visit reported no recurrent TIA/CVA symptoms. Her BP was out of control due to her running out of her medications. At that appt, Dr Kim [name] recommended continuing the low dose eliquis.  She then saw Dr Kim [name] 06/24/2022 for consult for her AF. At that appointment, Dr Kim [name] recommended continuing the low dose Eliquis.  She was admitted 07/21/2022 to [facility] with a left cerebellar intraparenchymal hemorrhage. Her BP was again significantly elevated during that hospitalization. She remained asymptomatic based on hospital notes.   Today, she is accompanied by an interpreter and her daughter is on call. She reports feeling weaker and "slowed down" following her recent fall due to her bleeding. She has been told that the bleeding was likely due to HTN. Her BP at a previous clinic was 532 systolic, but her BP in clinic today is 992 systolic. Her blood thinner had been discontinued once she had been discharged from the hospital.  She previously had a 10-minute Afib episode in October 2023. She had another episode in January 2024 lasting 1 minute.  She will FU with a neurologist soon. She denies any chest [name], shortness of breath, or peripheral edema. No lightheadedness, headaches, syncope, orthopnea, or  PND.  Past Medical History:  Diagnosis Date   AKI (acute kidney injury) ([place]) 11/18/2021   Arthritis    Atrial fibrillation ([place])    Depression    Essential hypertension 06/28/2021   Mixed hyperlipidemia 06/28/2021   Restless leg syndrome 06/28/2021   Stroke [facility])    x2    Past Surgical History:  Procedure Laterality Date   BIOPSY  11/27/2021   Procedure: BIOPSY;  Surgeon: Kim Creamer, MD;  Location: [place];  Service: Gastroenterology;;   Kim [name] STUDY  08/02/2021   Procedure: BUBBLE STUDY;  Surgeon: Kim Pain, MD;  Location: [place] ENDOSCOPY;  Service: Cardiovascular;;   ESOPHAGOGASTRODUODENOSCOPY (EGD) WITH PROPOFOL N/A 11/27/2021   Procedure: ESOPHAGOGASTRODUODENOSCOPY (EGD) WITH PROPOFOL;  Surgeon: Kim Creamer, MD;  Location: [facility] ENDOSCOPY;  Service: Gastroenterology;  Laterality: N/A;   FLEXIBLE SIGMOIDOSCOPY N/A 11/21/2021   Procedure: FLEXIBLE SIGMOIDOSCOPY;  Surgeon: Kim Banister, MD;  Location: [facility] ENDOSCOPY;  Service: Gastroenterology;  Laterality: N/A;   LOOP RECORDER INSERTION N/A 08/02/2021   Procedure: LOOP RECORDER INSERTION;  Surgeon: Vickie Epley, MD;  Location: [place] CV LAB;  Service: Cardiovascular;  Laterality: N/A;   TEE WITHOUT CARDIOVERSION N/A 08/02/2021   Procedure: TRANSESOPHAGEAL ECHOCARDIOGRAM (TEE);  Surgeon: Kim Pain, MD;  Location: [facility] ENDOSCOPY;  Service: Cardiovascular;  Laterality: N/A;   TEE WITHOUT CARDIOVERSION N/A 04/05/2022   Procedure: TRANSESOPHAGEAL ECHOCARDIOGRAM (TEE);  Surgeon: Kim Perla, MD;  Location: [facility] ENDOSCOPY;  Service: Cardiovascular;  Laterality: N/A;    Current Medications: Current Meds  Medication Sig   amLODipine (NORVASC) 10 MG tablet Take 1 tablet by mouth daily.   apixaban (ELIQUIS) 2.5 MG TABS tablet Take 1 tablet (2.5 mg total) by mouth 2 (two) times daily.   clotrimazole-betamethasone (LOTRISONE) cream Apply 1 application. topically as needed (rash).   hydrALAZINE (APRESOLINE) 50 MG  tablet Take 50 mg by mouth in the morning and at bedtime.   hydroxychloroquine (PLAQUENIL) 200 MG tablet Take 200 mg by mouth daily.   losartan (COZAAR) 50 MG tablet Take 1 tablet by mouth daily.   magnesium oxide (MAG-OX) 400 MG tablet Take 400 mg by mouth every evening.   ondansetron (ZOFRAN) 4 MG tablet Take 4 mg by mouth every 8 (eight) hours as needed for nausea or vomiting.   pantoprazole (PROTONIX) 40 MG tablet Take 40 mg by mouth daily.   polyethylene glycol (MIRALAX / GLYCOLAX) 17 g packet Take 17 g by mouth daily as needed for mild constipation. (Patient taking differently: Take 17 g by mouth daily.)   rOPINIRole (REQUIP) 1 MG tablet Take 1 mg by mouth at bedtime.   rosuvastatin (CRESTOR) 5 MG tablet Take 1 tablet by mouth daily.   senna-docusate (SENOKOT-S) 8.6-50 MG tablet Take 1 tablet by mouth every morning.   sodium bicarbonate 650 MG tablet Take 650 mg by mouth daily.   traMADol (ULTRAM) 50 MG tablet Take 25 mg by mouth daily.   [DISCONTINUED] rosuvastatin (CRESTOR) 40 MG tablet Take 1 tablet (40 mg total) by mouth daily.     Allergies:   Beef-derived products, Beta adrenergic blockers, Bystolic [nebivolol hcl], Chicken protein, Fish-derived products, Neurontin [gabapentin], Norvasc [amlodipine], Other, Pork-derived products, Trandate [labetalol], and Zestril [lisinopril]   Social History   Socioeconomic History   Marital status: Married    Spouse name: Not on file   Number of children: Not on file   Years of education: Not on file   Highest education level: Not on file  Occupational History   Not on file  Tobacco Use   Smoking status: Never   Smokeless tobacco: Never   Tobacco comments:    Never smoke 12/13/21  Vaping Use   Vaping Use: Never used  Substance and Sexual Activity   Alcohol use: No   Drug use: No   Sexual activity: Not on file  Other Topics Concern   Not on file  Social History Narrative   Not on file   Social Determinants of Health    Financial Resource Strain: Not on file  Food Insecurity: Not on file  Transportation Needs: Not on file  Physical Activity: Not on file  Stress: Not on file  Social Connections: Not on file     Family History: The patient's family history is negative for Heart disease.  ROS:   Please see the history of present illness.    (+)fall All other systems reviewed and are negative.  EKGs/Labs/Other Studies Reviewed:    The following studies were reviewed today:   Recent Labs: 11/19/2021: ALT 12; TSH 2.465 11/24/2021: Magnesium 2.1 04/05/2022: BUN 33; Creatinine, Ser 1.51; Potassium 4.0; Sodium 135 07/12/2022: Hemoglobin 9.9; Platelets 215   Recent Lipid Panel    Component Value Date/Time   CHOL 142 07/31/2021 0152   TRIG 49 07/31/2021 0152   HDL 55 07/31/2021 0152   CHOLHDL 2.6 07/31/2021 0152   VLDL 10 07/31/2021 0152   LDLCALC 77 07/31/2021 0152    Physical Exam:    VS:  BP 118/60   Pulse (!) 56   Ht  4\' 9"  (1.448 m)   Wt 98 lb 12.8 oz (44.8 kg)   SpO2 98%   BMI 21.38 kg/m     Wt Readings from Last 3 Encounters:  08/17/22 98 lb 12.8 oz (44.8 kg)  06/09/22 106 lb 3.2 oz (48.2 kg)  05/12/22 105 lb (47.6 kg)     GEN:  Well nourished, well developed in no acute distress CARDIAC: RRR, no murmurs, rubs, gallops, Device pocket well healed. RESPIRATORY:  Clear to auscultation without rales, wheezing or rhonchi     ASSESSMENT:    1. Atrial fibrillation, chronic ([place])   2. Chronic heart failure with preserved ejection fraction ([place])   3. Intracranial hemorrhage (HCC)    PLAN:    In order of problems listed above:  #Atrial fibrillation #Hx of intracranial hemorrhage Low burden of atrial fibrillation by loop recorder monitoring.  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    Long discussion with the patient's family during today's visit regarding the pros and cons of restarting anticoagulation.  Given her medical history, frailty, recent  hemorrhagic stroke, history of falls, I do not think she is a good candidate for restarting anticoagulation.  We discussed how she is at an increased risk of ischemic stroke while off anticoagulation.   I will plan to see her back in 3 months to see how she is doing and to review upcoming appointments with consultants.        Follow-up: 3 months  Medication Adjustments/Labs and Tests Ordered: Current medicines are reviewed at length with the patient today.  Concerns regarding medicines are outlined above.  No orders of the defined types were placed in this encounter.  No orders of the defined types were placed in this encounter.   I,Mitra Faeizi,acting as a Education administrator for Vickie Epley, MD.,have documented all relevant documentation on the behalf of Vickie Epley, MD,as directed by  Vickie Epley, MD while in the presence of Vickie Epley, MD.  I, Vickie Epley, MD, have reviewed all documentation for this visit. The documentation on 08/17/22 for the exam, diagnosis, procedures, and orders are all accurate and complete.   Signed, Lars Mage, MD, [facility], [facility] 08/17/2022 4:45 PM    Electrophysiology [facility]

## 2022-08-17 NOTE — Patient Instructions (Signed)
Medication Instructions:  Your physician recommends that you continue on your current medications as directed. Please refer to the Current Medication list given to you today.  *If you need a refill on your cardiac medications before your next appointment, please call your pharmacy*  Follow-Up: At [facility], you and your health needs are our priority.  As part of our continuing mission to provide you with exceptional heart care, we have created designated Provider Care Teams.  These Care Teams include your primary Cardiologist (physician) and Advanced Practice Providers (APPs -  Physician Assistants and Nurse Practitioners) who all work together to provide you with the care you need, when you need it.  Your next appointment:   3 month(s)  Provider:   Cameron Lambert, MD     

## 2022-08-18 ENCOUNTER — Encounter (HOSPITAL_COMMUNITY): Payer: Self-pay | Admitting: *Deleted

## 2022-08-21 LAB — CUP PACEART REMOTE DEVICE CHECK
Date Time Interrogation Session: 20240209230548
Implantable Pulse Generator Implant Date: 20230123

## 2022-08-22 ENCOUNTER — Ambulatory Visit: Payer: Medicare Other

## 2022-08-22 DIAGNOSIS — G459 Transient cerebral ischemic attack, unspecified: Secondary | ICD-10-CM

## 2022-08-22 NOTE — Progress Notes (Signed)
Carelink Summary Report / Loop Recorder 

## 2022-09-05 ENCOUNTER — Telehealth: Payer: Self-pay | Admitting: Cardiology

## 2022-09-05 NOTE — Telephone Encounter (Signed)
Per Dr. Marisue Humble, She would like to speak with Dr. Quentin Ore in regards to pt's brain bleed and trying to navigate what to do with pt's blood thinner.

## 2022-09-22 ENCOUNTER — Ambulatory Visit (INDEPENDENT_AMBULATORY_CARE_PROVIDER_SITE_OTHER): Payer: Medicare Other

## 2022-09-22 DIAGNOSIS — G459 Transient cerebral ischemic attack, unspecified: Secondary | ICD-10-CM | POA: Diagnosis not present

## 2022-09-23 LAB — CUP PACEART REMOTE DEVICE CHECK
Date Time Interrogation Session: 20240313231214
Implantable Pulse Generator Implant Date: 20230123

## 2022-10-05 NOTE — Progress Notes (Signed)
Carelink Summary Report / Loop Recorder 

## 2022-10-25 ENCOUNTER — Ambulatory Visit (INDEPENDENT_AMBULATORY_CARE_PROVIDER_SITE_OTHER): Payer: Medicare Other

## 2022-10-25 DIAGNOSIS — I480 Paroxysmal atrial fibrillation: Secondary | ICD-10-CM | POA: Diagnosis not present

## 2022-10-25 LAB — CUP PACEART REMOTE DEVICE CHECK
Date Time Interrogation Session: 20240415230213
Implantable Pulse Generator Implant Date: 20230123

## 2022-10-25 NOTE — Progress Notes (Signed)
Carelink Summary Report / Loop Recorder 

## 2022-11-28 ENCOUNTER — Ambulatory Visit (INDEPENDENT_AMBULATORY_CARE_PROVIDER_SITE_OTHER): Payer: Medicare Other

## 2022-11-28 DIAGNOSIS — G459 Transient cerebral ischemic attack, unspecified: Secondary | ICD-10-CM

## 2022-11-28 LAB — CUP PACEART REMOTE DEVICE CHECK
Date Time Interrogation Session: 20240518230016
Implantable Pulse Generator Implant Date: 20230123

## 2022-11-28 NOTE — Progress Notes (Signed)
Carelink Summary Report / Loop Recorder 

## 2022-12-19 ENCOUNTER — Ambulatory Visit: Payer: Medicare Other | Attending: Cardiology | Admitting: Cardiology

## 2022-12-19 ENCOUNTER — Encounter: Payer: Self-pay | Admitting: Cardiology

## 2022-12-19 VITALS — BP 126/64 | HR 62 | Ht <= 58 in | Wt 104.0 lb

## 2022-12-19 DIAGNOSIS — G459 Transient cerebral ischemic attack, unspecified: Secondary | ICD-10-CM

## 2022-12-19 DIAGNOSIS — I480 Paroxysmal atrial fibrillation: Secondary | ICD-10-CM | POA: Diagnosis present

## 2022-12-19 DIAGNOSIS — I629 Nontraumatic intracranial hemorrhage, unspecified: Secondary | ICD-10-CM | POA: Diagnosis present

## 2022-12-19 NOTE — Patient Instructions (Signed)
Medication Instructions:  Your physician recommends that you continue on your current medications as directed. Please refer to the Current Medication list given to you today.  *If you need a refill on your cardiac medications before your next appointment, please call your pharmacy*  Follow-Up: At [facility], you and your health needs are our priority.  As part of our continuing mission to provide you with exceptional heart care, we have created designated Provider Care Teams.  These Care Teams include your primary Cardiologist (physician) and Advanced Practice Providers (APPs -  Physician Assistants and Nurse Practitioners) who all work together to provide you with the care you need, when you need it.  Your next appointment:   1 year(s)  Provider:   You may see CAMERON T LAMBERT, MD or one of the following Advanced Practice Providers on your designated Care Team:   Renee Ursuy, PA-C Michael "Andy" Tillery, PA-C Suzann Riddle, NP     

## 2022-12-19 NOTE — Progress Notes (Signed)
  Electrophysiology Office Follow up Visit Note:    Date:  12/19/2022   ID:  [name], DOB 10-Jan-1946, MRN [id number]  PCP:  Judd Lien, PA-C  [facility] Cardiologist:  None  [facility] Electrophysiologist:  Lanier Prude, MD    Interval History:    Kim [name] is a 77 y.o. female who presents for a follow up visit.   She is with her daughter today in clinic who I have only previously met virtually.  Her daughter is a Nutritional therapist in [place].  The patient is doing fairly well today.  Overall stable.  Biggest complaint is lower extremity pain which is not new.  She is recently enrolled in a clinical trial [facility] looking at low-dose aspirin versus low-dose Eliquis for stroke prophylaxis in patients with a history of intracranial hemorrhage.  It is a double-blind, placebo controlled trial.   Past medical, surgical, social and family history were reviewed.  ROS:   Please see the history of present illness.    All other systems reviewed and are negative.  EKGs/Labs/Other Studies Reviewed:    The following studies were reviewed today:    EKG:  The ekg ordered today demonstrates sinus rhythm.  QRS duration 134 ms.   Physical Exam:    VS:  BP 126/64   Pulse 62   Ht 4\' 9"  (1.448 m)   Wt 104 lb (47.2 kg)   SpO2 99%   BMI 22.51 kg/m     Wt Readings from Last 3 Encounters:  12/19/22 104 lb (47.2 kg)  08/17/22 98 lb 12.8 oz (44.8 kg)  06/09/22 106 lb 3.2 oz (48.2 kg)     GEN:  elderly.  Frail.  In wheelchair. CARDIAC: RRR, no murmurs, rubs, gallops RESPIRATORY:  Clear to auscultation without rales, wheezing or rhonchi       ASSESSMENT:    1. Paroxysmal atrial fibrillation (HCC)   2. Intracranial hemorrhage (HCC)   3. TIA (transient ischemic attack)    PLAN:    In order of problems listed above:  #Atrial fibrillation, paroxysmal Overall low burden based on loop recorder monitoring.  We discussed the stroke risk associated with  her atrial fibrillation during today's clinic appointment even in the setting of a low burden of arrhythmia.  We discussed the pros and cons of being on an anticoagulant versus being off of an anticoagulant given her medical history.  For now, I think is very reasonable to continue with a clinical trial [facility] of [facility].  The daughter who is with her today is very understanding of the risks associated with both options.  #History of intracranial hemorrhage Recovering well.  Now on clinical trial as above looking at low-dose aspirin versus low-dose Eliquis in patients with a history of intracranial hemorrhage.   Follow-up with me in 1 year or sooner as needed.   Signed, Steffanie Dunn, MD, [facility], [facility] 12/19/2022 6:11 PM    Electrophysiology [facility]

## 2022-12-21 ENCOUNTER — Ambulatory Visit: Payer: Medicare Other | Admitting: Cardiology

## 2022-12-26 NOTE — Progress Notes (Signed)
Carelink Summary Report / Loop Recorder 

## 2023-01-02 ENCOUNTER — Ambulatory Visit (INDEPENDENT_AMBULATORY_CARE_PROVIDER_SITE_OTHER): Payer: Medicare Other

## 2023-01-02 DIAGNOSIS — I480 Paroxysmal atrial fibrillation: Secondary | ICD-10-CM

## 2023-01-02 LAB — CUP PACEART REMOTE DEVICE CHECK
Date Time Interrogation Session: 20240623230519
Implantable Pulse Generator Implant Date: 20230123

## 2023-01-20 NOTE — Progress Notes (Signed)
Carelink Summary Report / Loop Recorder 

## 2023-02-06 ENCOUNTER — Ambulatory Visit: Payer: Medicare Other

## 2023-02-06 DIAGNOSIS — G459 Transient cerebral ischemic attack, unspecified: Secondary | ICD-10-CM | POA: Diagnosis not present

## 2023-02-22 NOTE — Progress Notes (Signed)
Carelink Summary Report / Loop Recorder 

## 2023-03-09 LAB — CUP PACEART REMOTE DEVICE CHECK
Date Time Interrogation Session: 20240828230043
Implantable Pulse Generator Implant Date: 20230123

## 2023-03-14 ENCOUNTER — Ambulatory Visit (INDEPENDENT_AMBULATORY_CARE_PROVIDER_SITE_OTHER): Payer: Medicare Other

## 2023-03-14 DIAGNOSIS — G459 Transient cerebral ischemic attack, unspecified: Secondary | ICD-10-CM

## 2023-03-22 NOTE — Progress Notes (Signed)
Carelink Summary Report / Loop Recorder 

## 2023-04-12 LAB — CUP PACEART REMOTE DEVICE CHECK
Date Time Interrogation Session: 20240930230830
Implantable Pulse Generator Implant Date: 20230123

## 2023-04-17 ENCOUNTER — Ambulatory Visit (INDEPENDENT_AMBULATORY_CARE_PROVIDER_SITE_OTHER): Payer: Medicare Other

## 2023-04-17 DIAGNOSIS — I63542 Cerebral infarction due to unspecified occlusion or stenosis of left cerebellar artery: Secondary | ICD-10-CM

## 2023-05-01 NOTE — Progress Notes (Signed)
Carelink Summary Report / Loop Recorder 

## 2023-05-17 LAB — CUP PACEART REMOTE DEVICE CHECK
Date Time Interrogation Session: 20241102230424
Implantable Pulse Generator Implant Date: 20230123

## 2023-05-22 ENCOUNTER — Ambulatory Visit: Payer: Medicare Other

## 2023-05-22 DIAGNOSIS — G459 Transient cerebral ischemic attack, unspecified: Secondary | ICD-10-CM | POA: Diagnosis not present

## 2023-06-05 ENCOUNTER — Encounter: Payer: Self-pay | Admitting: Neurology

## 2023-06-05 ENCOUNTER — Ambulatory Visit: Payer: Medicare Other | Admitting: Neurology

## 2023-06-15 NOTE — Progress Notes (Signed)
Carelink Summary Report / Loop Recorder 

## 2023-06-16 ENCOUNTER — Ambulatory Visit (INDEPENDENT_AMBULATORY_CARE_PROVIDER_SITE_OTHER): Payer: Medicare Other

## 2023-06-16 DIAGNOSIS — G459 Transient cerebral ischemic attack, unspecified: Secondary | ICD-10-CM

## 2023-06-16 LAB — CUP PACEART REMOTE DEVICE CHECK
Date Time Interrogation Session: 20241205230617
Implantable Pulse Generator Implant Date: 20230123

## 2023-07-21 ENCOUNTER — Ambulatory Visit: Payer: Medicare Other

## 2023-07-21 DIAGNOSIS — G459 Transient cerebral ischemic attack, unspecified: Secondary | ICD-10-CM | POA: Diagnosis not present

## 2023-07-21 LAB — CUP PACEART REMOTE DEVICE CHECK
Date Time Interrogation Session: 20250109230723
Implantable Pulse Generator Implant Date: 20230123

## 2023-08-25 NOTE — Progress Notes (Signed)
Carelink Summary Report / Loop Recorder

## 2023-08-29 ENCOUNTER — Telehealth: Payer: Self-pay | Admitting: Internal Medicine

## 2023-08-29 NOTE — Telephone Encounter (Signed)
 I received critical lab value of K 6.1. I called patient to discuss. Her daughter-in-law, Paramjit, answered. I instructed Paramjit to hold losartan, and to go to the ER for further evaluation. She stated her mother did not want to go to the ER at this time, however, she stated she would go to the ER in the morning. I counseled the family on the risk of sudden cardiac death. Paramjit understood.

## 2024-05-16 ENCOUNTER — Emergency Department (HOSPITAL_BASED_OUTPATIENT_CLINIC_OR_DEPARTMENT_OTHER)
Admission: EM | Admit: 2024-05-16 | Discharge: 2024-05-16 | Discharge status: Against Medical Advice | Attending: Emergency Medicine | Admitting: Emergency Medicine

## 2024-05-16 ENCOUNTER — Other Ambulatory Visit: Payer: Self-pay

## 2024-05-16 ENCOUNTER — Emergency Department (HOSPITAL_BASED_OUTPATIENT_CLINIC_OR_DEPARTMENT_OTHER)

## 2024-05-16 DIAGNOSIS — Z5329 Procedure and treatment not carried out because of patient's decision for other reasons: Secondary | ICD-10-CM | POA: Insufficient documentation

## 2024-05-16 DIAGNOSIS — I509 Heart failure, unspecified: Secondary | ICD-10-CM | POA: Diagnosis not present

## 2024-05-16 DIAGNOSIS — Z7901 Long term (current) use of anticoagulants: Secondary | ICD-10-CM | POA: Insufficient documentation

## 2024-05-16 DIAGNOSIS — I110 Hypertensive heart disease with heart failure: Secondary | ICD-10-CM | POA: Diagnosis not present

## 2024-05-16 DIAGNOSIS — Z79899 Other long term (current) drug therapy: Secondary | ICD-10-CM | POA: Insufficient documentation

## 2024-05-16 DIAGNOSIS — R0602 Shortness of breath: Secondary | ICD-10-CM | POA: Diagnosis present

## 2024-05-16 LAB — CBC
HCT: 28 % — ABNORMAL LOW (ref 36.0–46.0)
Hemoglobin: 9.1 g/dL — ABNORMAL LOW (ref 12.0–15.0)
MCH: 30.3 pg (ref 26.0–34.0)
MCHC: 32.5 g/dL (ref 30.0–36.0)
MCV: 93.3 fL (ref 80.0–100.0)
Platelets: 305 K/uL (ref 150–400)
RBC: 3 MIL/uL — ABNORMAL LOW (ref 3.87–5.11)
RDW: 14.6 % (ref 11.5–15.5)
WBC: 6.4 K/uL (ref 4.0–10.5)
nRBC: 0 % (ref 0.0–0.2)

## 2024-05-16 LAB — BASIC METABOLIC PANEL WITH GFR
Anion gap: 14 (ref 5–15)
BUN: 26 mg/dL — ABNORMAL HIGH (ref 8–23)
CO2: 20 mmol/L — ABNORMAL LOW (ref 22–32)
Calcium: 8.6 mg/dL — ABNORMAL LOW (ref 8.9–10.3)
Chloride: 95 mmol/L — ABNORMAL LOW (ref 98–111)
Creatinine, Ser: 1.09 mg/dL — ABNORMAL HIGH (ref 0.44–1.00)
GFR, Estimated: 51 mL/min — ABNORMAL LOW (ref >60)
Glucose, Bld: 103 mg/dL — ABNORMAL HIGH (ref 70–99)
Potassium: 4 mmol/L (ref 3.5–5.1)
Sodium: 129 mmol/L — ABNORMAL LOW (ref 135–145)

## 2024-05-16 LAB — TROPONIN T, HIGH SENSITIVITY
Troponin T High Sensitivity: 62 ng/L — ABNORMAL HIGH (ref 0–19)
Troponin T High Sensitivity: 65 ng/L — ABNORMAL HIGH (ref 0–19)

## 2024-05-16 LAB — PRO BRAIN NATRIURETIC PEPTIDE: Pro Brain Natriuretic Peptide: >35000 pg/mL — ABNORMAL HIGH (ref <300.0)

## 2024-05-16 MED ORDER — FUROSEMIDE 10 MG/ML IJ SOLN
40.0000 mg | Freq: Once | INTRAMUSCULAR | Status: AC
  Administered 2024-05-16: 40 mg via INTRAVENOUS
  Filled 2024-05-16: qty 4

## 2024-05-16 NOTE — ED Notes (Signed)
 Called [facility] @17 :28 for transfer.  Face Sheet faxed and requested Imaging to send any Images to [facility].

## 2024-05-16 NOTE — ED Triage Notes (Signed)
 Pt presents after being seen by PCP.  BNP reported over 4000.  Family reports she has been on a diuretic but only for the past week and had finished that prescription. Also report pt is dehydrated d/t poor po intake.

## 2024-05-16 NOTE — Progress Notes (Signed)
 Spoke with Johal,Parmjeet Pam (On [place]) and advised of providers recommendations, Johal,Parmjeet Pam verbalized understanding. Intends to take patient to ED at [address].

## 2024-05-16 NOTE — ED Notes (Signed)
 Received call from [facility].  Patient will be transported via [facility] to [facility].  Paperwork and Transfer information was given to Nurse.

## 2024-05-16 NOTE — ED Triage Notes (Signed)
 Pt presents to ED sent by PMD for elevated BNP, increased SOB and generalized weakness onset a few days ago. Recent labs drawn which shows elevated BNP.

## 2024-05-16 NOTE — Progress Notes (Signed)
 Chief Complaint  Patient presents with  . Hypertension    Pt presents for follow up on htn.  Pt is not fasting today.   . Fatigue    Pt's daughter-in-law is with the pt today and thinks pt is dehydrated because she is fatigued, stays cold, etc.     . Medication Question    Pt is needing to be evaluated for blood thinner.     Subjective  Patient's past medical record including relevant notes, labs, and imaging reviewed in Epic/Care Everywhere prior to patient encounter today.   HPI  78 y.o. female presents for ongoing management of h/o CVA, dehydration, and elevated BNP level.  Medication adherence: Pt reports compliance with daily medication routine.  - Pt does understand goals of current medication therapy.  Patient denies fever, chills, chest pain, shortness of breath, edema, abdominal pain, nausea, vomiting, diarrhea, urinary symptoms, and joint pains.   PMH including allergies, medications, and family and social history reviewed by me and updated in Epic at time of visit today.   ROS Pertinent positives and negatives included in HPI. Otherwise normal.    Objective Vitals:   05/16/24 1000  BP: 106/63  BP Location: Left arm  Patient Position: Sitting  Pulse: 66  Resp: 16  Temp: 97.9 F (36.6 C)  TempSrc: Oral  SpO2: 99%  Weight: 42.7 kg (94 lb 1.6 oz)  Height: 1.448 m (4' 9)    Physical Exam  General: frail, elderly female, no acute distress Head: Normocephalic, atraumatic Eyes: PERRL, conjunctiva and sclera normal bilaterally.   Lungs: Chest clear to auscultation throughout lung fields bilaterally. No wheezes or rales present. Normal respiratory effort.  Heart: Normal S1 and S2, RRR, no murmurs, rubs, or gallops  Neurologic: Alert and oriented x3, in w/c today  Behavioral Health Screening  Patient Health Questionnaire-2 Score: 0 (05/07/2024 10:09 AM)      Patient's Depression screening is Negative   Depression Plan: Normal/Negative Screening  Orders  Placed This Encounter  Procedures  . B-Type Natriuretic Peptide (BNP)  . CBC with Differential    Release to patient::   Immediate  . Comprehensive Metabolic Panel    Release to patient::   Immediate  . CBC with Differential    Release to patient::   Immediate    Assessment/Plan 1. History of CVA (cerebrovascular accident) (Primary) Pt has elected NOT to continue the ASPIRE trial with neurosurgery Pt/family elect to take low dose aspirin  for anticoagulation - aspirin  81 mg tab; Take 81 mg by mouth daily.  Dispense: 90 tablet; Refill: 3  2. Dehydration Check labs today; can tx pending results - CBC with Differential - Comprehensive Metabolic Panel  3. Elevated brain natriuretic peptide (BNP) level Recheck labs today; severely elevated last week  Took diuretic Will recheck today - B-Type Natriuretic Peptide (BNP)    Requested Prescriptions   Signed Prescriptions Disp Refills  . aspirin  81 mg tab 90 tablet 3    Sig: Take 81 mg by mouth daily.    Instructions and side effects for new medications were discussed. All questions answered at today's visit.   Follow up 3 mos, sooner if needed.   Patient instructed to return to clinic sooner or seek urgent or emergent care for problems with medications or new or worsening symptoms including but not limited to fever, chills, nausea, vomiting, diarrhea, chest pain and shortness of breath.    Electronically signed by:  Gerard Garwin Roys, PA-C, 05/16/2024 10:53 AM  There are no Patient Instructions  on file for this visit.

## 2024-05-16 NOTE — ED Provider Notes (Signed)
 [facility] Provider Note   CSN: [id number] Arrival date & time: 05/16/24  1655     Patient presents with: Abnormal Labs   Kim [name] is a 78 y.o. female hx of HTN, here with elevated BNP. Patient has shortness of breath for several weeks. Patient was seen by cardiology 2 weeks ago and it was unclear why she had elevated BNP. Patient was seen by PCP a week ago and was on lasix  20 mg daily for 5 days. She had decreased PO intake. Has persistent shortness of breath with minimal exertion and also Paroxysmal dyspnea. Went back to PCP today and had labs done this morning that showed BNP of 4000 and she is sent for admission for diuresis.    The history is provided by the patient.       Prior to Admission medications   Medication Sig Start Date End Date Taking? Authorizing Provider  amLODipine  (NORVASC ) 10 MG tablet Take 1 tablet by mouth daily. 07/29/22   [provider]  apixaban  (ELIQUIS ) 2.5 MG TABS tablet Take 1 tablet (2.5 mg total) by mouth 2 (two) times daily. 05/12/22   Cindie Ole DASEN, MD  clotrimazole-betamethasone (LOTRISONE) cream Apply 1 application. topically as needed (rash). 10/10/21   [provider]  hydrALAZINE  (APRESOLINE ) 50 MG tablet Take 50 mg by mouth 3 (three) times daily.    [provider]  hydroxychloroquine  (PLAQUENIL ) 200 MG tablet Take 200 mg by mouth daily.    [provider]  losartan  (COZAAR ) 100 MG tablet Take 100 mg by mouth daily.    [provider]  pantoprazole  (PROTONIX ) 40 MG tablet Take 40 mg by mouth daily.    [provider]  polyethylene glycol (MIRALAX  / GLYCOLAX ) 17 g packet Take 17 g by mouth daily as needed for mild constipation. Patient taking differently: Take 17 g by mouth daily. 06/30/21   Arlice Reichert, MD  rOPINIRole  (REQUIP ) 1 MG tablet Take 1 mg by mouth at bedtime.    [provider]  rosuvastatin  (CRESTOR ) 5 MG tablet Take  1 tablet by mouth daily. 08/16/22   [provider]  senna-docusate (SENOKOT-S) 8.6-50 MG tablet Take 1 tablet by mouth every morning. 07/09/21   [provider]  traMADol  (ULTRAM ) 50 MG tablet Take 25 mg by mouth daily. 01/08/20   [provider]    Allergies: Beta adrenergic blockers, Bovine (beef) protein-containing drug products, Bystolic [nebivolol hcl], Chicken protein, Fish protein-containing drug products, Neurontin [gabapentin], Norvasc  [amlodipine ], Other, Porcine (pork) protein-containing drug products, Trandate  [labetalol ], and Zestril [lisinopril]    Review of Systems  Respiratory:  Positive for shortness of breath.   All other systems reviewed and are negative.   Updated Vital Signs BP 114/80   Pulse 68   Temp 97.6 F (36.4 C) (Oral)   Resp 20   SpO2 99%   Physical Exam Vitals and nursing note reviewed.  Constitutional:      Comments: Chronically ill   HENT:     Head: Normocephalic.     Nose: Nose normal.     Mouth/Throat:     Mouth: Mucous membranes are moist.  Cardiovascular:     Rate and Rhythm: Normal rate and regular rhythm.  Pulmonary:     Comments: Crackles bilateral bases  Musculoskeletal:        General: Normal range of motion.     Cervical back: Normal range of motion and neck supple.  Skin:    General: Skin  is warm.     Capillary Refill: Capillary refill takes less than 2 seconds.  Neurological:     General: No focal deficit present.     Mental Status: She is alert and oriented to person, place, and time.  Psychiatric:        Mood and Affect: Mood normal.        Behavior: Behavior normal.     (all labs ordered are listed, but only abnormal results are displayed) Labs Reviewed  BASIC METABOLIC PANEL WITH GFR  CBC  PRO BRAIN NATRIURETIC PEPTIDE  TROPONIN T, HIGH SENSITIVITY    EKG: None  Radiology: No results found.   Procedures   Medications Ordered in the ED  furosemide  (LASIX ) injection 40 mg (has no  administration in time range)                                    Medical Decision Making Kim [name] is a 78 y.o. female here with SOB. BNP was 5000 a week ago and still 4000 today. Appears volume overloaded. Will get cbc, cmp, BNP. Will order IV lasix .    6:26 PM Talked to Dr. Tobie from cardiology [facility], who accepted her for tele bed at [facility]. Will transfer   Problems Addressed: Acute on chronic heart failure, unspecified heart failure type [facility]): acute illness or injury  Amount and/or Complexity of Data Reviewed Labs: ordered. Decision-making details documented in ED Course. Radiology: ordered and independent interpretation performed.  Risk Prescription drug management.    Final diagnoses:  None    ED Discharge Orders     None          Patt Alm Macho, MD 05/16/24 1827
